# Patient Record
Sex: Male | Born: 1980 | Race: White | Hispanic: Yes | Smoking: Never smoker
Health system: Southern US, Community
[De-identification: ages and names within clinical notes are randomized; demographics above are authoritative.]

## PROBLEM LIST (undated history)

## (undated) ENCOUNTER — Emergency Department (HOSPITAL_COMMUNITY): Admission: EM | Payer: Self-pay | Source: Home / Self Care

## (undated) DIAGNOSIS — E785 Hyperlipidemia, unspecified: Secondary | ICD-10-CM

## (undated) DIAGNOSIS — I10 Essential (primary) hypertension: Secondary | ICD-10-CM

## (undated) DIAGNOSIS — N189 Chronic kidney disease, unspecified: Secondary | ICD-10-CM

## (undated) DIAGNOSIS — T7840XA Allergy, unspecified, initial encounter: Secondary | ICD-10-CM

## (undated) HISTORY — PX: NO PAST SURGERIES: SHX2092

## (undated) HISTORY — DX: Chronic kidney disease, unspecified: N18.9

## (undated) HISTORY — DX: Hyperlipidemia, unspecified: E78.5

## (undated) HISTORY — DX: Allergy, unspecified, initial encounter: T78.40XA

---

## 2008-04-08 ENCOUNTER — Emergency Department (HOSPITAL_COMMUNITY): Admission: EM | Admit: 2008-04-08 | Discharge: 2008-04-08 | Payer: Self-pay | Admitting: Emergency Medicine

## 2009-12-16 ENCOUNTER — Emergency Department (HOSPITAL_COMMUNITY): Admission: EM | Admit: 2009-12-16 | Discharge: 2009-12-16 | Payer: Self-pay | Admitting: Family Medicine

## 2015-12-14 ENCOUNTER — Encounter (HOSPITAL_COMMUNITY): Payer: Self-pay | Admitting: Emergency Medicine

## 2015-12-14 ENCOUNTER — Ambulatory Visit (HOSPITAL_COMMUNITY)
Admission: EM | Admit: 2015-12-14 | Discharge: 2015-12-14 | Disposition: A | Discharge status: Home / Self Care | Payer: Self-pay | Attending: Family Medicine | Admitting: Family Medicine

## 2015-12-14 DIAGNOSIS — N451 Epididymitis: Secondary | ICD-10-CM | POA: Insufficient documentation

## 2015-12-14 MED ORDER — CEFTRIAXONE SODIUM 250 MG IJ SOLR
INTRAMUSCULAR | Status: AC
  Filled 2015-12-14: qty 250

## 2015-12-14 MED ORDER — CEFTRIAXONE SODIUM 250 MG IJ SOLR
250.0000 mg | Freq: Once | INTRAMUSCULAR | Status: AC
  Administered 2015-12-14: 250 mg via INTRAMUSCULAR

## 2015-12-14 MED ORDER — LIDOCAINE HCL (PF) 1 % IJ SOLN
INTRAMUSCULAR | Status: AC
  Filled 2015-12-14: qty 5

## 2015-12-14 MED ORDER — DOXYCYCLINE HYCLATE 100 MG PO CAPS: 100.0000 mg | ORAL_CAPSULE | Freq: Two times a day (BID) | ORAL | Status: DC

## 2015-12-14 NOTE — ED Notes (Signed)
Testicular pain since February and has been seen at the clinic for this complaint.

## 2015-12-14 NOTE — Discharge Instructions (Signed)
Hipertensión  (Hypertension)  El término hipertensión es otra forma de denominar a la presión arterial elevada. La presión arterial elevada fuerza al corazón a trabajar más para bombear la sangre. Una lectura de la presión arterial consta de dos números: uno más alto sobre uno más bajo (por ejemplo, 110/72).  CUIDADOS EN EL HOGAR   · Haga que el médico le tome nuevamente la presión arterial.  · Tome los medicamentos solamente como se lo haya indicado el médico. Siga cuidadosamente las indicaciones. Los medicamentos pierden eficacia si omite dosis. El hecho de omitir las dosis también aumenta el riesgo de otros problemas.  · No fume.  · Contrólese la presión arterial en su casa como se lo haya indicado el médico.  SOLICITE AYUDA SI:  · Piensa que tiene una reacción a los medicamentos que está tomando.  · Tiene mareos o dolores de cabeza reiterados.  · Se le inflaman (hinchan) los tobillos.  · Tiene problemas de visión.  SOLICITE AYUDA DE INMEDIATO SI:   · Tiene un dolor de cabeza muy intenso y está confundido.  · Se siente débil, aturdido o se desmaya.  · Tiene dolor en el pecho o el estómago (abdominal).  · Tiene vómitos.  · No puede respirar muy bien.  ASEGÚRESE DE QUE:   · Comprende estas instrucciones.  · Controlará su afección.  · Recibirá ayuda de inmediato si no mejora o si empeora.     Esta información no tiene como fin reemplazar el consejo del médico. Asegúrese de hacerle al médico cualquier pregunta que tenga.     Document Released: 12/08/2009 Document Revised: 06/25/2013  Elsevier Interactive Patient Education ©2016 Elsevier Inc[employer]

## 2015-12-15 LAB — URINE CYTOLOGY ANCILLARY ONLY
CHLAMYDIA, DNA PROBE: NEGATIVE
Neisseria Gonorrhea: NEGATIVE

## 2015-12-15 NOTE — ED Provider Notes (Signed)
CSN: [id number]     Arrival date & time 12/14/15  1731 History   First MD Initiated Contact with Patient 12/14/15 1914     Chief Complaint  Patient presents with  . Testicle Pain   (Consider location/radiation/quality/duration/timing/severity/associated sxs/prior Treatment) HPI History obtained from patient/interpreter Location:   scrotum Context/Duration: Originally in February, was treated with cipro, symptoms got better, recently returned.   Severity:2   Quality:hard to describe, but not acute sharp pain Timing:      episodic      Home Treatment: none except no sex Associated symptoms:  none Family History:none    History reviewed. No pertinent past medical history. History reviewed. No pertinent past surgical history. No family history on file. Social History  Substance Use Topics  . Smoking status: Never Smoker   . Smokeless tobacco: None  . Alcohol Use: Yes    Review of Systems  Denies: HEADACHE, NAUSEA, ABDOMINAL PAIN, CHEST PAIN, CONGESTION, DYSURIA, SHORTNESS OF BREATH  Allergies  Review of patient's allergies indicates no known allergies.  Home Medications   Prior to Admission medications   Medication Sig Start Date End Date Taking? Authorizing Provider  doxycycline (VIBRAMYCIN) 100 MG capsule Take 1 capsule (100 mg total) by mouth 2 (two) times daily. 12/14/15   Tharon AquasFrank C Birney Belshe, PA   Meds Ordered and Administered this Visit   Medications  cefTRIAXone (ROCEPHIN) injection 250 mg (250 mg Intramuscular Given 12/14/15 1947)    BP 161/104 mmHg  Pulse 99  Temp(Src) 98 F (36.7 C) (Oral)  SpO2 100% No data found.   Physical Exam NURSES NOTES AND VITAL SIGNS REVIEWED. CONSTITUTIONAL: Well developed, well nourished, no acute distress HEENT: normocephalic, atraumatic EYES: Conjunctiva normal NECK:normal ROM, supple, no adenopathy PULMONARY:No respiratory distress, normal effort ABDOMINAL: Soft, ND, NT BS+, No CVAT MUSCULOSKELETAL: Normal ROM of all  extremities,  SKIN: warm and dry without rash PSYCHIATRIC: Mood and affect, behavior are normal GENITALIA: NORMAL EXTERNAL MALE PENIS AND SCROTUM.  NO VISIBLE OR PALPABLE LESIONS.  Left epididymis is tender to palpation.  Testicles are both in a vertical lie.  ED Course  Procedures (including critical care time)  Labs Review Labs Reviewed  URINE CYTOLOGY ANCILLARY ONLY   pending Imaging Review No results found.   Visual Acuity Review  Right Eye Distance:   Left Eye Distance:   Bilateral Distance:    Right Eye Near:   Left Eye Near:    Bilateral Near:      Rx for doxycycline and ceftriaxone There is no suggestion of torsion, no abnormal mass palpated. Pt does not perform scrotal self examination.  Suggest follow up with urology if not steadily getting better  MDM   1. Epididymitis, left     Patient is reassured that there are no issues that require transfer to higher level of care at this time or additional tests. Patient is advised to continue home symptomatic treatment. Patient is advised that if there are new or worsening symptoms to attend the emergency department, contact primary care provider, or return to UC. Instructions of care provided discharged home in stable condition.    THIS NOTE WAS GENERATED USING A VOICE RECOGNITION SOFTWARE PROGRAM. ALL REASONABLE EFFORTS  WERE MADE TO PROOFREAD THIS DOCUMENT FOR ACCURACY.  I have verbally reviewed the discharge instructions with the patient. A printed AVS was given to the patient.  All questions were answered prior to discharge.      Tharon AquasFrank C Lucah Petta, GeorgiaPA 12/15/15 [phone]

## 2015-12-21 ENCOUNTER — Emergency Department (HOSPITAL_COMMUNITY)
Admission: EM | Admit: 2015-12-21 | Discharge: 2015-12-21 | Disposition: A | Discharge status: Home / Self Care | Payer: Self-pay | Attending: Emergency Medicine | Admitting: Emergency Medicine

## 2015-12-21 ENCOUNTER — Emergency Department (HOSPITAL_COMMUNITY): Payer: Self-pay

## 2015-12-21 DIAGNOSIS — N433 Hydrocele, unspecified: Secondary | ICD-10-CM | POA: Insufficient documentation

## 2015-12-21 DIAGNOSIS — R51 Headache: Secondary | ICD-10-CM | POA: Insufficient documentation

## 2015-12-21 DIAGNOSIS — Z79899 Other long term (current) drug therapy: Secondary | ICD-10-CM | POA: Insufficient documentation

## 2015-12-21 DIAGNOSIS — N5089 Other specified disorders of the male genital organs: Secondary | ICD-10-CM

## 2015-12-21 LAB — URINALYSIS, ROUTINE W REFLEX MICROSCOPIC
Bilirubin Urine: NEGATIVE
GLUCOSE, UA: NEGATIVE mg/dL
HGB URINE DIPSTICK: NEGATIVE
Ketones, ur: NEGATIVE mg/dL
Leukocytes, UA: NEGATIVE
Nitrite: NEGATIVE
Protein, ur: NEGATIVE mg/dL
SPECIFIC GRAVITY, URINE: 1.025 (ref 1.005–1.030)
pH: 5.5 (ref 5.0–8.0)

## 2015-12-21 MED ORDER — IBUPROFEN 600 MG PO TABS: 600.0000 mg | ORAL_TABLET | Freq: Four times a day (QID) | ORAL | Status: DC | PRN

## 2015-12-21 NOTE — ED Notes (Signed)
Language line used for discharge instructions, pt denies concerns with discharge

## 2015-12-21 NOTE — Discharge Instructions (Signed)
Hidrocele en los adultos  (Hydrocele, Adult)  El hidrocele es la acumulación de líquido en la bolsa de piel flácida que aloja los testículos (escroto). Generalmente, afecta solo a un testículo.  CAUSAS  Esta afección puede ser causada por lo siguiente:  · Una lesión en el escroto.  · Una infección.  · Un tumor o cáncer en el testículo.  · Torsión de un testículo.  · Disminución del flujo sanguíneo hacia el escroto.  SÍNTOMAS  El hidrocele se siente como un globo lleno de agua. También puede sentirse pesado. El hidrocele puede causar lo siguiente:  · Hinchazón del escroto. Esta puede disminuir al acostarse.  · Hinchazón de la ingle.  · Molestia leve en el escroto.  · Dolor. Este puede aparecer si el hidrocele fue causado por una infección o una torsión.  DIAGNÓSTICO  Esta afección se puede diagnosticar mediante la historia clínica, un examen físico y estudios de diagnóstico por imágenes. También se pueden hacer análisis de sangre y de orina para determinar si hay infecciones.  TRATAMIENTO  El tratamiento puede incluir lo siguiente:  · Conducta expectante, en especial si el hidrocele no causa síntomas.  · Tratamiento de la afección preexistente. Esto puede incluir la administración de antibióticos.  · Cirugía para drenar el líquido. Algunas opciones quirúrgicas incluyen lo siguiente:    Aspiración con aguja. Para este procedimiento, se usa una aguja para drenar el líquido.    Hidrocelectomía. Para este procedimiento, se realiza una incisión en el escroto para extirpar el saco de líquido.  INSTRUCCIONES PARA EL CUIDADO EN EL HOGAR  · Concurra a todas las visitas de control como se lo haya indicado el médico. Esto es importante.  · Vigile el hidrocele para detectar cualquier cambio.  · Tome los medicamentos de venta libre y los recetados solamente como se lo haya indicado el médico.  · Si le recetaron un antibiótico, tómelo como se lo haya indicado el médico. No deje de usar el antibiótico aunque la afección  mejore.  SOLICITE ATENCIÓN MÉDICA SI:  · La hinchazón del escroto o de la ingle empeora.  · El hidrocele se enrojece, está duro o sensible al tacto, o le causa dolor.  · Observa cambios en el hidrocele.  · Tiene fiebre.     Esta información no tiene como fin reemplazar el consejo del médico. Asegúrese de hacerle al médico cualquier pregunta que tenga.     Document Released: 04/10/2013 Document Revised: 11/04/2014  Elsevier Interactive Patient Education ©2016 Elsevier Inc.

## 2015-12-21 NOTE — ED Notes (Addendum)
Patient complains of one testical is enlarged and is in pain, patient to clinic last Wednesday for this and they told him to come to ED if pain doesn't get better. Patient was diagnosed with Epididymitis, started on antibiotics.

## 2015-12-21 NOTE — ED Notes (Signed)
Patient states he has been having headaches since December and blurred vision, currently taking Doxyxycline.

## 2015-12-21 NOTE — ED Provider Notes (Signed)
CSN: [id number]     Arrival date & time 12/21/15  1608 History   First MD Initiated Contact with Patient 12/21/15 1814     Chief Complaint  Patient presents with  . Groin Pain  . Headache     (Consider location/radiation/quality/duration/timing/severity/associated sxs/prior Treatment) HPI Comments: Patient presents to the emergency department with chief complaint of left testicular pain and swelling. Patient states that the symptoms started on January 30. States that they have been intermittent until now. He has been treated in the past for epididymitis. He denies any improvement for this. Denies any dysuria or penile discharge. Denies any abdominal pain, nausea, or vomiting. He states that since starting doxycycline at urgent care a few days ago he has had some headache, generalized pruritus, and some floaters in his vision which is intermittent.  The history is provided by the patient. No language interpreter was used.    No past medical history on file. No past surgical history on file. No family history on file. Social History  Substance Use Topics  . Smoking status: Never Smoker   . Smokeless tobacco: Not on file  . Alcohol Use: Yes    Review of Systems  Constitutional: Negative for fever and chills.  Respiratory: Negative for shortness of breath.   Cardiovascular: Negative for chest pain.  Gastrointestinal: Negative for nausea, vomiting, diarrhea and constipation.  Genitourinary: Positive for scrotal swelling and testicular pain. Negative for dysuria.  All other systems reviewed and are negative.     Allergies  Review of patient's allergies indicates no known allergies.  Home Medications   Prior to Admission medications   Medication Sig Start Date End Date Taking? Authorizing Provider  doxycycline (VIBRAMYCIN) 100 MG capsule Take 1 capsule (100 mg total) by mouth 2 (two) times daily. 12/14/15   Tharon Aquas, PA   BP 155/92 mmHg  Pulse 82  Temp(Src) 98.2 F  (36.8 C) (Oral)  Resp 18  SpO2 98% Physical Exam  Constitutional: He is oriented to person, place, and time. He appears well-developed and well-nourished.  HENT:  Head: Normocephalic and atraumatic.  Eyes: Conjunctivae and EOM are normal. Pupils are equal, round, and reactive to light. Right eye exhibits no discharge. Left eye exhibits no discharge. No scleral icterus.  Neck: Normal range of motion. Neck supple. No JVD present.  Cardiovascular: Normal rate, regular rhythm and normal heart sounds.  Exam reveals no gallop and no friction rub.   No murmur heard. Pulmonary/Chest: Effort normal and breath sounds normal. No respiratory distress. He has no wheezes. He has no rales. He exhibits no tenderness.  Abdominal: Soft. He exhibits no distension and no mass. There is no tenderness. There is no rebound and no guarding.  No focal abdominal tenderness, no RLQ tenderness or pain at McBurney's point, no RUQ tenderness or Murphy's sign, no left-sided abdominal tenderness, no fluid wave, or signs of peritonitis   Genitourinary:  Left testicle is moderately enlarged and tender to palpation posteriorly Uncircumcised No other masses, lesions, or discharge noted  Musculoskeletal: Normal range of motion. He exhibits no edema or tenderness.  Neurological: He is alert and oriented to person, place, and time.  Skin: Skin is warm and dry.  Psychiatric: He has a normal mood and affect. His behavior is normal. Judgment and thought content normal.  Nursing note and vitals reviewed.   ED Course  Procedures (including critical care time) Results for orders placed or performed during the hospital encounter of 12/21/15  Urinalysis, Routine w reflex microscopic (  not at [facility])  Result Value Ref Range   Color, Urine YELLOW YELLOW   APPearance CLEAR [place] [place]   Specific Gravity, Urine 1.025 1.005 - 1.030   pH 5.5 5.0 - 8.0   Glucose, UA NEGATIVE NEGATIVE mg/dL   Hgb urine dipstick NEGATIVE NEGATIVE   Bilirubin  Urine NEGATIVE NEGATIVE   Ketones, ur NEGATIVE NEGATIVE mg/dL   Protein, ur NEGATIVE NEGATIVE mg/dL   Nitrite NEGATIVE NEGATIVE   Leukocytes, UA NEGATIVE NEGATIVE   [place] Scrotum  12/21/2015  CLINICAL DATA:  Left testicular swelling and pain EXAM: ULTRASOUND OF SCROTUM TECHNIQUE: Complete ultrasound examination of the testicles, epididymis, and other scrotal structures was performed. COMPARISON:  None. FINDINGS: Right testicle Measurements: 5 x 3 x 3.6 cm. Normal color Doppler signal with arterial and venous waveforms recorded. No mass or microlithiasis visualized. Left testicle Measurements: 4.7 x 2.9 x 3.7 cm. Normal color Doppler signal. Arterial and venous waveforms are recorded. No mass or microlithiasis visualized. Right epididymis:  Normal in size and appearance. Left epididymis:  Normal in size and appearance. Hydrocele:  Small, bilateral. Varicocele:  None visualized. IMPRESSION: 1. Normal testes. 2. Small bilateral hydroceles. Electronically Signed   By: Corlis Leak  Hassell M.D.   On: 12/21/2015 20:19   [place] Art/ven Flow Abd Pelv Doppler  12/21/2015  CLINICAL DATA:  Left testicular swelling and pain EXAM: ULTRASOUND OF SCROTUM TECHNIQUE: Complete ultrasound examination of the testicles, epididymis, and other scrotal structures was performed. COMPARISON:  None. FINDINGS: Right testicle Measurements: 5 x 3 x 3.6 cm. Normal color Doppler signal with arterial and venous waveforms recorded. No mass or microlithiasis visualized. Left testicle Measurements: 4.7 x 2.9 x 3.7 cm. Normal color Doppler signal. Arterial and venous waveforms are recorded. No mass or microlithiasis visualized. Right epididymis:  Normal in size and appearance. Left epididymis:  Normal in size and appearance. Hydrocele:  Small, bilateral. Varicocele:  None visualized. IMPRESSION: 1. Normal testes. 2. Small bilateral hydroceles. Electronically Signed   By: Corlis Leak  Hassell M.D.   On: 12/21/2015 20:19      MDM   Final diagnoses:  Hydrocele in  adult    Patient with left testicular swelling and pain since January. His symptoms have been intermittent. He is been treated intermittently for epididymitis once with good relief, and most recently without any relief.  Ultrasound is consistent with small bilateral hydroceles, otherwise normal testes, normal Doppler color-flow for arterial and venous. No evidence of torsion. Patient is well-appearing. Symptoms seem to be chronic. No penile discharge. Will discontinue doxycycline given that there is no evidence of epididymitis on ultrasound. Recommend urology follow-up.  Patient advised to follow-up with his primary care doctor regarding his headaches. He is neurovascularly intact. Vital signs are stable. Do not feel that additional emergent workup is indicated today.  Patient seen by and discussed with Dr. Denton LankSteinl, who agrees with the plan.    Roxy HorsemanRobert Lilliemae Fruge, PA-C 12/21/15 2119  Cathren LaineKevin Steinl, MD 12/21/15 [phone]

## 2015-12-22 LAB — GC/CHLAMYDIA PROBE AMP (~~LOC~~) NOT AT ARMC
CHLAMYDIA, DNA PROBE: NEGATIVE
NEISSERIA GONORRHEA: NEGATIVE

## 2015-12-25 ENCOUNTER — Ambulatory Visit: Payer: Self-pay | Attending: Internal Medicine

## 2016-01-25 ENCOUNTER — Encounter (HOSPITAL_COMMUNITY): Payer: Self-pay | Admitting: *Deleted

## 2016-01-25 ENCOUNTER — Emergency Department (HOSPITAL_COMMUNITY)
Admission: EM | Admit: 2016-01-25 | Discharge: 2016-01-26 | Disposition: A | Discharge status: Home / Self Care | Payer: Self-pay | Attending: Emergency Medicine | Admitting: Emergency Medicine

## 2016-01-25 DIAGNOSIS — I10 Essential (primary) hypertension: Secondary | ICD-10-CM | POA: Insufficient documentation

## 2016-01-25 DIAGNOSIS — K5909 Other constipation: Secondary | ICD-10-CM | POA: Insufficient documentation

## 2016-01-25 DIAGNOSIS — N5082 Scrotal pain: Secondary | ICD-10-CM | POA: Insufficient documentation

## 2016-01-25 DIAGNOSIS — M7918 Myalgia, other site: Secondary | ICD-10-CM

## 2016-01-25 DIAGNOSIS — K648 Other hemorrhoids: Secondary | ICD-10-CM | POA: Insufficient documentation

## 2016-01-25 DIAGNOSIS — K5904 Chronic idiopathic constipation: Secondary | ICD-10-CM

## 2016-01-25 HISTORY — DX: Essential (primary) hypertension: I10

## 2016-01-25 NOTE — ED Triage Notes (Addendum)
Pt c/o left hip pain and burning sensation going down his left leg when having a bowel movement, last BM today, reports a little blood when he strains to have BM. Pt denies dysuria. Pt states about a month ago he was dx with fluid in his testicles but [facility] and told [facility] had [facility] her[facility]. Pt does a lot of heavy lifting at work. Pt denies n/v

## 2016-01-26 LAB — POC OCCULT BLOOD, ED: FECAL OCCULT BLD: NEGATIVE

## 2016-01-26 MED ORDER — HYDROCORTISONE ACETATE 25 MG RE SUPP: 25.0000 mg | Freq: Two times a day (BID) | RECTAL | 0 refills | Status: DC

## 2016-01-26 MED ORDER — NAPROXEN 500 MG PO TABS: 500.0000 mg | ORAL_TABLET | Freq: Two times a day (BID) | ORAL | 0 refills | Status: DC

## 2016-01-26 NOTE — ED Provider Notes (Signed)
[facility]-EMERGENCY DEPT Provider Note   CSN: [id number] Arrival date & time: 01/25/16  1738  First Provider Contact:  First MD Initiated Contact with Patient 01/26/16 0314     By signing my name below, I, Bethel Born, attest that this documentation has been prepared under the direction and in the presence of Dione Booze, MD. Electronically Signed: Bethel Born, ED Scribe. 01/26/16. 3:43 AM  History   Chief Complaint Chief Complaint  Patient presents with  . Hip Pain  . Testicle Pain    HPI  The history is provided by the patient. A language interpreter was used.   [name] [name] is a 35 y.o. male who presents to the Emergency Department complaining of intermittent, burning, left buttock pain with onset 1 month ago. The pain is occasionally worse with walking but not bending or lifting. He took nothing for pain at home. Associated symptoms include intermittent back pain and left-sided testicular pain. Pt notes that he was treated for an infection at the testicle and then for fluid at the left testicle but the pain has persisted.  He also complains of pain and straining with hard bowel movements after whiich he noes blood. The bowel symptoms started 1 month ago.   Past Medical History:  Diagnosis Date  . Hypertension     There are no active problems to display for this patient.   History reviewed. No pertinent surgical history.     Home Medications    Prior to Admission medications   Medication Sig Start Date End Date Taking? Authorizing Provider  ibuprofen (ADVIL,MOTRIN) 600 MG tablet Take 1 tablet (600 mg total) by mouth every 6 (six) hours as needed. Patient taking differently: Take 600 mg by mouth every 6 (six) hours as needed for moderate pain.  12/21/15  Yes Roxy Horseman, PA-C    Family History History reviewed. No pertinent family history.  Social History Social History  Substance Use Topics  . Smoking status: Never Smoker  . Smokeless tobacco: Never  Used  . Alcohol use Yes     Allergies   Review of patient's allergies indicates no known allergies.   Review of Systems Review of Systems  Gastrointestinal: Positive for blood in stool and constipation.  Genitourinary: Positive for testicular pain.  Musculoskeletal: Positive for back pain.       Pain at left buttock   All other systems reviewed and are negative.    Physical Exam Updated Vital Signs BP 130/74   Pulse 66   Temp 97.4 F (36.3 C) (Oral)   Resp 20   SpO2 98%   Physical Exam  Constitutional: He is oriented to person, place, and time. He appears well-developed and well-nourished.  HENT:  Head: Normocephalic and atraumatic.  Eyes: EOM are normal. Pupils are equal, round, and reactive to light.  Neck: Normal range of motion. Neck supple. No JVD present.  Cardiovascular: Normal rate, regular rhythm, normal heart sounds and intact distal pulses.   No murmur heard. Pulmonary/Chest: Effort normal and breath sounds normal. He has no wheezes. He has no rales. He exhibits no tenderness.  Abdominal: Soft. He exhibits no distension and no mass. There is no tenderness.  Genitourinary:  Genitourinary Comments: Uncircumcised penis Testes descended without massed or tenderness Mild tenderness at left spermatic chord Mild to moderate tenderness at left inguinal ring  No external hemorrhoids or fissures Normal sphincter tone Internal hemorrhoid palpable No stool present No gross blood   Musculoskeletal: Normal range of motion. He exhibits tenderness. He exhibits no  edema.  No tenderness in the back Mild tenderness at left gluteal area Negative SLR   Lymphadenopathy:    He has no cervical adenopathy.  Neurological: He is alert and oriented to person, place, and time. He has normal reflexes. No cranial nerve deficit. He exhibits normal muscle tone. Coordination normal.  Skin: Skin is warm and dry. No rash noted.  Psychiatric: He has a normal mood and affect. His  behavior is normal. Judgment and thought content normal.  Nursing note and vitals reviewed.    ED Treatments / Results  Labs (all labs ordered are listed, but only abnormal results are displayed) Results for orders placed or performed during the hospital encounter of 01/25/16  POC occult blood, ED Provider will collect  Result Value Ref Range   Fecal Occult Bld NEGATIVE NEGATIVE   Procedures Procedures (including critical care time)  Medications Ordered in ED Medications - No data to display   Initial Impression / Assessment and Plan / ED Course  I have reviewed the triage vital signs and the nursing notes.  COORDINATION OF CARE: 3:40 AM Discussed treatment plan which includes lab work with pt at bedside and pt agreed to plan.  Pertinent lab results that were available during my care of the patient were reviewed by me and considered in my medical decision making (see chart for details).  Clinical Course    Left buttock pain clearly musculoskeletal. No definite signs radicular pain. Scrotal pain appears to be related to known hydrocele. Old records are reviewed showing urgent care visit on June 12 with diagnoses diverticulitis, ED visit June 19 with diagnosis of bilateral hydroceles. Rectal bleeding seems to be secondary to internal hemorrhoids. No evidence of occult blood on rectal exam. He is referred to urology for follow-up of his scrotal pain. He is given prescription for Anusol HC suppository for his hemorrhoids and naproxen for his buttock pain.   Final Clinical Impressions(s) / ED Diagnoses   Final diagnoses:  Pain in left buttock  Bleeding internal hemorrhoids  Functional constipation  Scrotal pain    New Prescriptions New Prescriptions   No medications on file   I personally performed the services described in this documentation, which was scribed in my presence. The recorded information has been reviewed and is accurate.      Dione Booze, MD 01/26/16  2115

## 2016-02-06 ENCOUNTER — Ambulatory Visit (HOSPITAL_COMMUNITY)
Admission: EM | Admit: 2016-02-06 | Discharge: 2016-02-06 | Disposition: A | Discharge status: Home / Self Care | Payer: Self-pay | Attending: Emergency Medicine | Admitting: Emergency Medicine

## 2016-02-06 ENCOUNTER — Encounter (HOSPITAL_COMMUNITY): Payer: Self-pay | Admitting: Emergency Medicine

## 2016-02-06 DIAGNOSIS — M7918 Myalgia, other site: Secondary | ICD-10-CM

## 2016-02-06 DIAGNOSIS — M791 Myalgia: Secondary | ICD-10-CM | POA: Insufficient documentation

## 2016-02-06 DIAGNOSIS — I10 Essential (primary) hypertension: Secondary | ICD-10-CM | POA: Insufficient documentation

## 2016-02-06 LAB — C-REACTIVE PROTEIN: CRP: 0.8 mg/dL (ref <1.0)

## 2016-02-06 LAB — SEDIMENTATION RATE: Sed Rate: 8 mm/hr (ref 0–16)

## 2016-02-06 MED ORDER — DICLOFENAC POTASSIUM 50 MG PO TABS: 50.0000 mg | ORAL_TABLET | Freq: Three times a day (TID) | ORAL | 0 refills | Status: DC

## 2016-02-06 NOTE — ED Notes (Signed)
Video interpreters # [phone] and [id number] were used in triage , provider assessment and discharge.

## 2016-02-06 NOTE — ED Triage Notes (Signed)
The patient presented to the [facility] with a complaint of bilateral arm pain that has been ongoing for 3 weeks. The patient also reported lower extremity edema off and on that increases with alcohol consumption.

## 2016-02-06 NOTE — ED Provider Notes (Signed)
CSN: [id number]     Arrival date & time 02/06/16  1158 History   First MD Initiated Contact with Patient 02/06/16 1216     Chief Complaint  Patient presents with  . Arm Pain   (Consider location/radiation/quality/duration/timing/severity/associated sxs/prior Treatment) HPI history is obtained through video interpreter Patient is a 35 year old male who has had chronic musculoskeletal pain for quite some time now. He has been seen in the emergency department. His description of the pain is quite vague he states that it is all over his body essentially and comes and goes lasts for 5-6 seconds and then moves to another spot. He also complains of pain in his left testicle and states that he is currently under treatment for this. He has had no other workup. He has been evaluated for hypertension and hypercholesterol.  Past Medical History:  Diagnosis Date  . Hypertension    History reviewed. No pertinent surgical history. History reviewed. No pertinent family history. Social History  Substance Use Topics  . Smoking status: Never Smoker  . Smokeless tobacco: Never Used  . Alcohol use Yes    Review of Systems  Denies: HEADACHE, NAUSEA, ABDOMINAL PAIN, CHEST PAIN, CONGESTION, DYSURIA, SHORTNESS OF BREATH  Allergies  Review of patient's allergies indicates no known allergies.  Home Medications   Prior to Admission medications   Medication Sig Start Date End Date Taking? Authorizing Provider  atorvastatin (LIPITOR) 10 MG tablet Take 10 mg by mouth daily.   Yes Historical Provider, MD  lisinopril-hydrochlorothiazide (PRINZIDE,ZESTORETIC) 10-12.5 MG tablet Take 1 tablet by mouth daily.   Yes Historical Provider, MD  diclofenac (CATAFLAM) 50 MG tablet Take 1 tablet (50 mg total) by mouth 3 (three) times daily. 02/06/16   Tharon Aquas, PA  hydrocortisone (ANUSOL-HC) 25 MG suppository Place 1 suppository (25 mg total) rectally 2 (two) times daily. 01/26/16   Dione Booze, MD  naproxen (NAPROSYN)  500 MG tablet Take 1 tablet (500 mg total) by mouth 2 (two) times daily. 01/26/16   Dione Booze, MD   Meds Ordered and Administered this Visit  Medications - No data to display  BP 134/77 (BP Location: Left Arm)   Pulse 83   Temp 98.6 F (37 C) (Oral)   Resp 16   SpO2 100%  No data found.   Physical Exam NURSES NOTES AND VITAL SIGNS REVIEWED. CONSTITUTIONAL: Well developed, well nourished, no acute distress HEENT: normocephalic, atraumatic EYES: Conjunctiva normal NECK:normal ROM, supple, no adenopathy PULMONARY:No respiratory distress, normal effort ABDOMINAL: Soft, ND, NT BS+, No CVAT MUSCULOSKELETAL: Normal ROM of all extremities,  SKIN: warm and dry without rash PSYCHIATRIC: Mood and affect, behavior are normal  Urgent Care Course   Clinical Course    Procedures (including critical care time)  Labs Review Labs Reviewed  SEDIMENTATION RATE  C-REACTIVE PROTEIN  Reviewed  Imaging Review No results found.   Visual Acuity Review  Right Eye Distance:   Left Eye Distance:   Bilateral Distance:    Right Eye Near:   Left Eye Near:    Bilateral Near:        Diclofenac urology follow-up for hydrocele MDM   1. Musculoskeletal pain     Patient is reassured that there are no issues that require transfer to higher level of care at this time or additional tests. Patient is advised to continue home symptomatic treatment. Patient is advised that if there are new or worsening symptoms to attend the emergency department, contact primary care provider, or return to UC. Instructions  of care provided discharged home in stable condition.    THIS NOTE WAS GENERATED USING A VOICE RECOGNITION SOFTWARE PROGRAM. ALL REASONABLE EFFORTS  WERE MADE TO PROOFREAD THIS DOCUMENT FOR ACCURACY.  I have verbally reviewed the discharge instructions with the patient. A printed AVS was given to the patient.  All questions were answered prior to discharge.      Tharon Aquas,  PA 02/06/16 1430

## 2016-02-09 ENCOUNTER — Encounter (HOSPITAL_COMMUNITY): Payer: Self-pay

## 2016-02-09 ENCOUNTER — Emergency Department (HOSPITAL_COMMUNITY)
Admission: EM | Admit: 2016-02-09 | Discharge: 2016-02-10 | Disposition: A | Discharge status: Home / Self Care | Payer: Self-pay | Attending: Emergency Medicine | Admitting: Emergency Medicine

## 2016-02-09 DIAGNOSIS — M791 Myalgia: Secondary | ICD-10-CM | POA: Insufficient documentation

## 2016-02-09 DIAGNOSIS — I10 Essential (primary) hypertension: Secondary | ICD-10-CM | POA: Insufficient documentation

## 2016-02-09 DIAGNOSIS — H538 Other visual disturbances: Secondary | ICD-10-CM | POA: Insufficient documentation

## 2016-02-09 DIAGNOSIS — Z79899 Other long term (current) drug therapy: Secondary | ICD-10-CM | POA: Insufficient documentation

## 2016-02-09 DIAGNOSIS — M7918 Myalgia, other site: Secondary | ICD-10-CM

## 2016-02-09 DIAGNOSIS — M2550 Pain in unspecified joint: Secondary | ICD-10-CM | POA: Insufficient documentation

## 2016-02-09 NOTE — ED Provider Notes (Signed)
[facility] DEPT Provider Note   CSN: [id number] Arrival date & time: 02/09/16  2876  First Provider Contact:  None       History   Chief Complaint Chief Complaint  Patient presents with  . Sciatica  . Blurred Vision  . Leg Swelling    HPI [name] [name] is a 35 y.o. male.  HPI  This patient has multiple complaints. He complains of pain in his left buttock, toes, knees, and arms for about a month. He was seen here twice for it and states he isn't feeling better. He has not yet established a PCP. The pain is worse with movement, better with rest. He also complains of some blurry vision in his right eye for the same time period.    Past Medical History:  Diagnosis Date  . Hypertension     There are no active problems to display for this patient.   History reviewed. No pertinent surgical history.     Home Medications    Prior to Admission medications   Medication Sig Start Date End Date Taking? Authorizing Provider  atorvastatin (LIPITOR) 10 MG tablet Take 10 mg by mouth daily.    Historical Provider, MD  diclofenac (CATAFLAM) 50 MG tablet Take 1 tablet (50 mg total) by mouth 3 (three) times daily. 02/06/16   Konrad Felix, PA  hydrocortisone (ANUSOL-HC) 25 MG suppository Place 1 suppository (25 mg total) rectally 2 (two) times daily. 02/12/56   Delora Fuel, MD  lisinopril-hydrochlorothiazide (PRINZIDE,ZESTORETIC) 10-12.5 MG tablet Take 1 tablet by mouth daily.    Historical Provider, MD  naproxen (NAPROSYN) 500 MG tablet Take 1 tablet (500 mg total) by mouth 2 (two) times daily. 2/62/03   Delora Fuel, MD    Family History History reviewed. No pertinent family history.  Social History Social History  Substance Use Topics  . Smoking status: Never Smoker  . Smokeless tobacco: Never Used  . Alcohol use Yes     Allergies   Doxycycline   Review of Systems Review of Systems  Constitutional: Negative for chills and fever.  HENT: Negative for ear pain and  sore throat.   Eyes: Positive for visual disturbance. Negative for photophobia, pain, discharge, redness and itching.  Respiratory: Negative for cough and shortness of breath.   Cardiovascular: Negative for chest pain and palpitations.  Gastrointestinal: Negative for abdominal pain and vomiting.  Genitourinary: Negative for dysuria and hematuria.  Musculoskeletal: Positive for arthralgias, back pain and myalgias.  Skin: Negative for color change and rash.  Neurological: Negative for seizures and syncope.  All other systems reviewed and are negative.    Physical Exam Updated Vital Signs BP 128/74 (BP Location: Right Arm)   Pulse 80   Temp 98 F (36.7 C) (Oral)   Resp 18   Ht _0  (1.727 m)   Wt 90.7 kg   SpO2 96%   BMI 30.41 kg/m   Physical Exam  Constitutional: He appears well-developed and well-nourished.  HENT:  Head: Normocephalic and atraumatic.  Eyes: Conjunctivae, EOM and lids are normal. Pupils are equal, round, and reactive to light. Right conjunctiva is not injected. Right conjunctiva has no hemorrhage. Left conjunctiva is not injected. Left conjunctiva has no hemorrhage. Right eye exhibits normal extraocular motion. Left eye exhibits normal extraocular motion. Right pupil is round and reactive. Left pupil is round and reactive. Pupils are equal.  Fundoscopic exam:      The right eye shows no AV nicking, no exudate, no hemorrhage and no papilledema.  The left eye shows no AV nicking, no exudate, no hemorrhage and no papilledema.  Neck: Neck supple.  Cardiovascular: Normal rate and regular rhythm.   No murmur heard. Pulmonary/Chest: Effort normal and breath sounds normal. No respiratory distress.  Abdominal: Soft. There is no tenderness.  Musculoskeletal: He exhibits no edema.  TTP over left buttock  Neurological: He is alert.  Skin: Skin is warm and dry.  Psychiatric: He has a normal mood and affect.  Nursing note and vitals reviewed.    ED Treatments /  Results  Labs (all labs ordered are listed, but only abnormal results are displayed) Labs Reviewed - No data to display  EKG  EKG Interpretation None       Radiology No results found.  Procedures Procedures (including critical care time)  Medications Ordered in ED Medications - No data to display   Initial Impression / Assessment and Plan / ED Course  I have reviewed the triage vital signs and the nursing notes.  Pertinent labs & imaging results that were available during my care of the patient were reviewed by me and considered in my medical decision making (see chart for details).  Clinical Course    Patient has received evaluation here for these complaints. He appears stable here. Regarding blurry vision in right eye, his neurologic exam is normal. The eye appears normal, PERRL, EOMI, and on fundoscopic exam there is no sign of vitreous hemhorrage or hyphema, and normal appearing vessels. Bedside ocular ultrasound done and did not show retinal attachment.   Regarding muscle pain, it does appear to be musculoskeletal. No signs of spinal cord compression syndromes. Back XR done of SI joints as screening exam for ankylosing spondylitis and was negative. He recently had a negative GC/CT and normal ESR/CRP.   Will encourage him to establish care with a PCP.   Final Clinical Impressions(s) / ED Diagnoses   Final diagnoses:  None    New Prescriptions New Prescriptions   No medications on file     Levada Schilling, MD 02/10/16 4163    Sherwood Gambler, MD 02/11/16 [phone]

## 2016-02-09 NOTE — ED Notes (Signed)
Patient is Spanish speaking only and will need an interpreter

## 2016-02-09 NOTE — ED Triage Notes (Signed)
Information via [employer] video interpreting.  Pt reports onset 1 month left sciatica pain that does not radiate.  Pt reports lifted item at work at onset.  Onset 1 month blurred vision, joint pain at toes, hands, arms and bilateral LE swelling.  Reports has not drank alcohol in 1 month.  Ambulating without difficulty.

## 2016-02-10 ENCOUNTER — Emergency Department (HOSPITAL_COMMUNITY): Payer: Self-pay

## 2016-02-10 NOTE — ED Notes (Signed)
See downtime notes for previous care given.

## 2016-02-27 ENCOUNTER — Ambulatory Visit (HOSPITAL_COMMUNITY)
Admission: EM | Admit: 2016-02-27 | Discharge: 2016-02-27 | Disposition: A | Discharge status: Home / Self Care | Payer: Self-pay | Attending: Emergency Medicine | Admitting: Emergency Medicine

## 2016-02-27 ENCOUNTER — Encounter (HOSPITAL_COMMUNITY): Payer: Self-pay | Admitting: Emergency Medicine

## 2016-02-27 DIAGNOSIS — K625 Hemorrhage of anus and rectum: Secondary | ICD-10-CM

## 2016-02-27 DIAGNOSIS — K5901 Slow transit constipation: Secondary | ICD-10-CM

## 2016-02-27 DIAGNOSIS — K648 Other hemorrhoids: Secondary | ICD-10-CM

## 2016-02-27 MED ORDER — POLYETHYLENE GLYCOL 3350 17 GM/SCOOP PO POWD: 17.0000 g | Freq: Every day | ORAL | 0 refills | Status: DC

## 2016-02-27 MED ORDER — HYDROCORTISONE ACE-PRAMOXINE 2.5-1 % RE CREA: 1.0000 "application " | TOPICAL_CREAM | Freq: Three times a day (TID) | RECTAL | 0 refills | Status: DC

## 2016-02-27 NOTE — Discharge Instructions (Signed)
Recommend using MiraLAX. Place 1 Full and 6 ounces of liquid and drink 1 of these mixes every hour 3 for constipation may leave. He made need to use one or 2 glasses a day until your colon is cleansed. Also start taking Colace 100 mg 3 times a day as a stool softener. Increase the fiber in your diet and limit fast food's, breads cheese.

## 2016-02-27 NOTE — ED Provider Notes (Signed)
CSN: [id number]     Arrival date & time 02/27/16  1202 History   First MD Initiated Contact with Patient 02/27/16 1259     Chief Complaint  Patient presents with  . Rectal Bleeding   (Consider location/radiation/quality/duration/timing/severity/associated sxs/prior Treatment) By mouth interpreter used. 35 year old Hispanic male presents to the urgent care for complaints of hemorrhoid. He has been seen in the emergency department in the urgent care for the [facility]. He has used the Anusol [facility] suppositories with minimal benefit. Even though he was advised that he was constipated and should not strain and to take laxatives he has not done these things. He has only use the suppositories. He states there is bleeding when straining, And then the bleeding stops. Denies abdominal pain, vomiting, fever.  It is noted that he has been to the emergency department several times as well has urgent care for multiple and varied complaints. He has been referred to urology in regarding to a hydrocele and is following up with that. He states that he was instructed to call for an appointment to obtain a PCP however he number he called stated that it would be a couple of months before he can get an appointment with anyone so he has not made an appointment.      Past Medical History:  Diagnosis Date  . Hypertension    History reviewed. No pertinent surgical history. History reviewed. No pertinent family history. Social History  Substance Use Topics  . Smoking status: Never Smoker  . Smokeless tobacco: Never Used  . Alcohol use Yes    Review of Systems  Constitutional: Negative for activity change, fatigue and fever.  HENT: Negative.   Eyes: Negative.   Respiratory: Negative.   Gastrointestinal: Positive for blood in stool, constipation and rectal pain. Negative for abdominal pain, diarrhea, nausea and vomiting.  Genitourinary: Negative.   Musculoskeletal: Negative.   Skin: Negative.   All other systems  reviewed and are negative.   Allergies  Doxycycline  Home Medications   Prior to Admission medications   Medication Sig Start Date End Date Taking? Authorizing Provider  atorvastatin (LIPITOR) 10 MG tablet Take 10 mg by mouth daily.   Yes Historical Provider, MD  lisinopril-hydrochlorothiazide (PRINZIDE,ZESTORETIC) 10-12.5 MG tablet Take 1 tablet by mouth daily.   Yes Historical Provider, MD  diclofenac (CATAFLAM) 50 MG tablet Take 1 tablet (50 mg total) by mouth 3 (three) times daily. 02/06/16   Tharon Aquas, PA  hydrocortisone (ANUSOL-HC) 25 MG suppository Place 1 suppository (25 mg total) rectally 2 (two) times daily. 01/26/16   Dione Booze, MD  hydrocortisone-pramoxine [facility]) 2.5-1 % rectal cream Place 1 application rectally 3 (three) times daily. 02/27/16   Hayden Rasmussen, NP  naproxen (NAPROSYN) 500 MG tablet Take 1 tablet (500 mg total) by mouth 2 (two) times daily. 01/26/16   Dione Booze, MD  polyethylene glycol powder (GLYCOLAX/MIRALAX) powder Take 17 g by mouth daily. 02/27/16   Hayden Rasmussen, NP   Meds Ordered and Administered this Visit  Medications - No data to display  BP 117/67 (BP Location: Left Arm)   Pulse 60   Temp 98.8 F (37.1 C) (Oral)   Resp 12   SpO2 98%  No data found.   Physical Exam  Constitutional: He is oriented to person, place, and time. He appears well-developed and well-nourished. No distress.  HENT:  Head: Normocephalic and atraumatic.  Eyes: EOM are normal.  Neck: Normal range of motion. Neck supple.  Cardiovascular: Normal rate.  Pulmonary/Chest: Effort normal. No respiratory distress.  Genitourinary:  Genitourinary Comments: Rectal exam: No evidence of external hemorrhoid or other lesions. No evidence of fissure or current gross bleeding. No erythema or hemorrhoidal tags. DRE reveals tenderness at 4:00 to 8:00 positions. There are soft cordlike structures in these positions produces tenderness to palpation. No other lesions palpated. No overt  bleeding. Prostate firm but mildly enlarged.  Musculoskeletal: He exhibits no edema.  Neurological: He is alert and oriented to person, place, and time. He exhibits normal muscle tone.  Skin: Skin is warm and dry.  Psychiatric: He has a normal mood and affect.  Nursing note and vitals reviewed.   Urgent Care Course   Clinical Course    Procedures (including critical care time)  Labs Review Labs Reviewed - No data to display  Imaging Review No results found.   Visual Acuity Review  Right Eye Distance:   Left Eye Distance:   Bilateral Distance:    Right Eye Near:   Left Eye Near:    Bilateral Near:         MDM   1. Slow transit constipation   2. Internal hemorrhoid   3. Rectal bleeding    Recommend using MiraLAX. Place 1 Full and 6 ounces of liquid and drink 1 of these mixes every hour 3 for constipation may leave. He made need to use one or 2 glasses a day until your colon is cleansed. Also start taking Colace 100 mg 3 times a day as a stool softener. Increase the fiber in your diet and limit fast food's, breads cheese. Information on high-power provider, constipation and medications are given to patient. Meds ordered this encounter  Medications  . hydrocortisone-pramoxine (ANALPRAM HC) 2.5-1 % rectal cream    Sig: Place 1 application rectally 3 (three) times daily.    Dispense:  30 g    Refill:  0    Order Specific Question:   Supervising Provider    Answer:   Eustace MooreMURRAY, LAURA W [[id number]]  . polyethylene glycol powder (GLYCOLAX/MIRALAX) powder    Sig: Take 17 g by mouth daily.    Dispense:  255 g    Refill:  0    Order Specific Question:   Supervising Provider    Answer:   Eustace MooreMURRAY, LAURA W [[id number]]       Hayden Rasmussenavid Dillyn Joaquin, NP 02/27/16 1336    Hayden Rasmussenavid Edona Schreffler, NP 02/27/16 [phone]

## 2016-02-27 NOTE — ED Triage Notes (Signed)
Pt c/o intermittent rectal bleeding onset x1 month  Reports he was seen at a [facility] facility and was treated for internal hemorrhoids   Sx today include rectal pain and occasional bleeding  A&O x4... NAD

## 2016-03-04 ENCOUNTER — Ambulatory Visit (INDEPENDENT_AMBULATORY_CARE_PROVIDER_SITE_OTHER): Payer: Self-pay | Admitting: Urology

## 2016-03-04 DIAGNOSIS — N50812 Left testicular pain: Secondary | ICD-10-CM

## 2016-03-10 ENCOUNTER — Other Ambulatory Visit: Payer: Self-pay | Admitting: Urology

## 2016-03-10 DIAGNOSIS — N50812 Left testicular pain: Secondary | ICD-10-CM

## 2016-03-15 ENCOUNTER — Ambulatory Visit (HOSPITAL_COMMUNITY)
Admission: RE | Admit: 2016-03-15 | Discharge: 2016-03-15 | Disposition: A | Discharge status: Home / Self Care | Payer: Self-pay | Source: Ambulatory Visit | Attending: Urology | Admitting: Urology

## 2016-03-15 DIAGNOSIS — I861 Scrotal varices: Secondary | ICD-10-CM | POA: Insufficient documentation

## 2016-03-15 DIAGNOSIS — N433 Hydrocele, unspecified: Secondary | ICD-10-CM | POA: Insufficient documentation

## 2016-03-15 DIAGNOSIS — N50812 Left testicular pain: Secondary | ICD-10-CM

## 2016-03-18 ENCOUNTER — Ambulatory Visit (INDEPENDENT_AMBULATORY_CARE_PROVIDER_SITE_OTHER): Payer: Self-pay | Admitting: Urology

## 2016-03-18 ENCOUNTER — Other Ambulatory Visit: Payer: Self-pay | Admitting: Urology

## 2016-03-18 DIAGNOSIS — N50812 Left testicular pain: Secondary | ICD-10-CM

## 2016-03-18 DIAGNOSIS — R300 Dysuria: Secondary | ICD-10-CM

## 2016-03-25 ENCOUNTER — Ambulatory Visit (HOSPITAL_COMMUNITY)
Admission: RE | Admit: 2016-03-25 | Discharge: 2016-03-25 | Disposition: A | Discharge status: Home / Self Care | Payer: Self-pay | Source: Ambulatory Visit | Attending: Urology | Admitting: Urology

## 2016-03-25 DIAGNOSIS — Q631 Lobulated, fused and horseshoe kidney: Secondary | ICD-10-CM | POA: Insufficient documentation

## 2016-03-25 DIAGNOSIS — N50812 Left testicular pain: Secondary | ICD-10-CM | POA: Insufficient documentation

## 2016-03-25 DIAGNOSIS — N1330 Unspecified hydronephrosis: Secondary | ICD-10-CM | POA: Insufficient documentation

## 2016-03-28 ENCOUNTER — Encounter: Payer: Self-pay | Admitting: Family Medicine

## 2016-03-28 ENCOUNTER — Ambulatory Visit: Payer: Self-pay | Attending: Family Medicine | Admitting: Family Medicine

## 2016-03-28 VITALS — BP 126/73 | HR 85 | Temp 98.6°F | Ht 69.0 in | Wt 240.4 lb

## 2016-03-28 DIAGNOSIS — E785 Hyperlipidemia, unspecified: Secondary | ICD-10-CM | POA: Insufficient documentation

## 2016-03-28 DIAGNOSIS — R609 Edema, unspecified: Secondary | ICD-10-CM | POA: Insufficient documentation

## 2016-03-28 DIAGNOSIS — Z79899 Other long term (current) drug therapy: Secondary | ICD-10-CM | POA: Insufficient documentation

## 2016-03-28 DIAGNOSIS — I10 Essential (primary) hypertension: Secondary | ICD-10-CM | POA: Insufficient documentation

## 2016-03-28 DIAGNOSIS — Z833 Family history of diabetes mellitus: Secondary | ICD-10-CM | POA: Insufficient documentation

## 2016-03-28 DIAGNOSIS — R600 Localized edema: Secondary | ICD-10-CM

## 2016-03-28 LAB — LIPID PANEL
Cholesterol: 201 mg/dL — ABNORMAL HIGH (ref 125–200)
HDL: 47 mg/dL
LDL Cholesterol: 88 mg/dL
Total CHOL/HDL Ratio: 4.3 ratio
Triglycerides: 330 mg/dL — ABNORMAL HIGH
VLDL: 66 mg/dL — ABNORMAL HIGH

## 2016-03-28 LAB — COMPLETE METABOLIC PANEL WITH GFR
ALBUMIN: 4.7 g/dL (ref 3.6–5.1)
ALK PHOS: 61 U/L (ref 40–115)
ALT: 35 U/L (ref 9–46)
AST: 26 U/L (ref 10–40)
BUN: 11 mg/dL (ref 7–25)
CALCIUM: 9 mg/dL (ref 8.6–10.3)
CO2: 20 mmol/L (ref 20–31)
CREATININE: 0.75 mg/dL (ref 0.60–1.35)
Chloride: 104 mmol/L (ref 98–110)
GFR, Est African American: >89 mL/min (ref >=60)
GFR, Est Non African American: >89 mL/min (ref >=60)
Glucose, Bld: 93 mg/dL (ref 65–99)
POTASSIUM: 4.3 mmol/L (ref 3.5–5.3)
Sodium: 137 mmol/L (ref 135–146)
Total Bilirubin: 0.5 mg/dL (ref 0.2–1.2)
Total Protein: 7.5 g/dL (ref 6.1–8.1)

## 2016-03-28 MED ORDER — ATORVASTATIN CALCIUM 10 MG PO TABS: 10.0000 mg | ORAL_TABLET | Freq: Every day | ORAL | 5 refills | Status: DC

## 2016-03-28 MED ORDER — LISINOPRIL-HYDROCHLOROTHIAZIDE 10-12.5 MG PO TABS: 1.0000 | ORAL_TABLET | Freq: Every day | ORAL | 5 refills | Status: DC

## 2016-03-28 NOTE — Progress Notes (Signed)
Subjective:  Patient ID: [name] [name], male    DOB: 08-07-1980  Age: 35 y.o. MRN: [id number]  CC: Hypertension and Hyperlipidemia   HPI [name] [name] is a 35 year old male with a history of hypertension and Hyperlipidemia who presents today to establish care. He is requesting blood work to screen for diabetes mellitus due to a positive family history. He has also been out of his antihypertensive and will need a refill today. Prior to now he received refills from a "[place] [facility] physician".  He is closely followed by urology for management of Hydrocele and received amitriptylline for scrotal pain.  Allergies  Allergen Reactions  . Doxycycline Rash    No current outpatient prescriptions on file prior to visit.   No current facility-administered medications on file prior to visit.      Outpatient Medications Prior to Visit  Medication Sig Dispense Refill  . atorvastatin (LIPITOR) 10 MG tablet Take 10 mg by mouth daily.    Marland Kitchen. lisinopril-hydrochlorothiazide (PRINZIDE,ZESTORETIC) 10-12.5 MG tablet Take 1 tablet by mouth daily.    . diclofenac (CATAFLAM) 50 MG tablet Take 1 tablet (50 mg total) by mouth 3 (three) times daily. (Patient not taking: Reported on 03/28/2016) 30 tablet 0  . hydrocortisone (ANUSOL-HC) 25 MG suppository Place 1 suppository (25 mg total) rectally 2 (two) times daily. (Patient not taking: Reported on 03/28/2016) 12 suppository 0  . hydrocortisone-pramoxine (ANALPRAM HC) 2.5-1 % rectal cream Place 1 application rectally 3 (three) times daily. (Patient not taking: Reported on 03/28/2016) 30 g 0  . naproxen (NAPROSYN) 500 MG tablet Take 1 tablet (500 mg total) by mouth 2 (two) times daily. (Patient not taking: Reported on 03/28/2016) 30 tablet 0  . polyethylene glycol powder (GLYCOLAX/MIRALAX) powder Take 17 g by mouth daily. (Patient not taking: Reported on 03/28/2016) 255 g 0   No facility-administered medications prior to visit.     ROS Review of Systems    Constitutional: Negative for activity change and appetite change.  HENT: Negative for sinus pressure and sore throat.   Eyes: Negative for visual disturbance.  Respiratory: Negative for cough, chest tightness and shortness of breath.   Cardiovascular: Negative for chest pain and leg swelling.  Gastrointestinal: Negative for abdominal distention, abdominal pain, constipation and diarrhea.  Endocrine: Negative.   Genitourinary: Negative for dysuria.  Musculoskeletal: Negative for joint swelling and myalgias.  Skin: Negative for rash.  Allergic/Immunologic: Negative.   Neurological: Negative for weakness, light-headedness and numbness.  Psychiatric/Behavioral: Negative for dysphoric mood and suicidal ideas.    Objective:  BP 126/73 (BP Location: Right Arm, Patient Position: Sitting, Cuff Size: Large)   Pulse 85   Temp 98.6 F (37 C) (Oral)   Ht 5\' 9"  (1.753 m)   Wt 240 lb 6.4 oz (109 kg)   SpO2 96%   BMI 35.50 kg/m   BP/Weight 03/28/2016 02/27/2016 02/10/2016  Systolic BP 126 117 119  Diastolic BP 73 67 79  Wt. (Lbs) 240.4 - -  BMI 35.5 - -      Physical Exam  Constitutional: He is oriented to person, place, and time. He appears well-developed and well-nourished.  Cardiovascular: Normal rate, normal heart sounds and intact distal pulses.   No murmur heard. Pulmonary/Chest: Effort normal and breath sounds normal. He has no wheezes. He has no rales. He exhibits no tenderness.  Abdominal: Soft. Bowel sounds are normal. He exhibits no distension and no mass. There is no tenderness.  Musculoskeletal: Normal range of motion.  Neurological: He is alert  and oriented to person, place, and time.     Assessment & Plan:   1. Essential hypertension Controlled - COMPLETE METABOLIC PANEL WITH GFR - Lipid panel - HgB A1c  2. Hyperlipidemia Continue Statin  3. Pedal edema Likely dependent and also due to the fact that he has been out of his lisinopril/HCTZ Low-sodium diet, DASH  diet   Meds ordered this encounter  Medications  . atorvastatin (LIPITOR) 10 MG tablet    Sig: Take 1 tablet (10 mg total) by mouth daily.    Dispense:  30 tablet    Refill:  5  . lisinopril-hydrochlorothiazide (PRINZIDE,ZESTORETIC) 10-12.5 MG tablet    Sig: Take 1 tablet by mouth daily.    Dispense:  30 tablet    Refill:  5    Follow-up: Return in about 3 weeks (around 04/18/2016) for follow up on pedal edema.   Jaclyn Shaggy MD

## 2016-03-28 NOTE — Progress Notes (Signed)
Wanting to become established- realizes that he is very late for appt Mother is diabetic Would like testing to r/o diabetes- patient is fasting Not a good historian of meds.

## 2016-03-29 ENCOUNTER — Telehealth: Payer: Self-pay

## 2016-03-29 ENCOUNTER — Ambulatory Visit: Payer: Self-pay | Attending: Internal Medicine

## 2016-03-29 DIAGNOSIS — I10 Essential (primary) hypertension: Secondary | ICD-10-CM | POA: Insufficient documentation

## 2016-03-29 DIAGNOSIS — E785 Hyperlipidemia, unspecified: Secondary | ICD-10-CM | POA: Insufficient documentation

## 2016-03-29 DIAGNOSIS — R600 Localized edema: Secondary | ICD-10-CM | POA: Insufficient documentation

## 2016-03-29 LAB — POCT GLYCOSYLATED HEMOGLOBIN (HGB A1C): Hemoglobin A1C: 5.4

## 2016-03-29 NOTE — Telephone Encounter (Signed)
Through [employer] patient was called with lab results and MD's recommendations.

## 2016-03-29 NOTE — Telephone Encounter (Signed)
-----   Message from Jaclyn ShaggyEnobong Amao, MD sent at 03/29/2016  9:17 AM EDT ----- Labs are negative for Diabetes, cholesterol is normal but triglycerides are slightly elevated; advise to use OTC fish oil caps

## 2016-03-29 NOTE — Addendum Note (Signed)
Addended by: Minerva AreolaLIEB, Antigone Crowell E on: 03/29/2016 04:33 PM   Modules accepted: Orders

## 2016-03-29 NOTE — Progress Notes (Signed)
Patient here for lab work only 

## 2016-04-06 ENCOUNTER — Ambulatory Visit: Payer: Self-pay | Attending: Family Medicine

## 2016-04-29 ENCOUNTER — Other Ambulatory Visit: Payer: Self-pay | Admitting: Urology

## 2016-04-29 ENCOUNTER — Ambulatory Visit (INDEPENDENT_AMBULATORY_CARE_PROVIDER_SITE_OTHER): Payer: Self-pay | Admitting: Urology

## 2016-04-29 DIAGNOSIS — Q631 Lobulated, fused and horseshoe kidney: Secondary | ICD-10-CM

## 2016-04-29 DIAGNOSIS — Q6211 Congenital occlusion of ureteropelvic junction: Principal | ICD-10-CM

## 2016-04-29 DIAGNOSIS — Q6239 Other obstructive defects of renal pelvis and ureter: Secondary | ICD-10-CM

## 2016-05-02 ENCOUNTER — Other Ambulatory Visit: Payer: Self-pay | Admitting: Urology

## 2016-05-02 ENCOUNTER — Other Ambulatory Visit (HOSPITAL_COMMUNITY): Payer: Self-pay | Admitting: Urology

## 2016-05-02 DIAGNOSIS — Q6239 Other obstructive defects of renal pelvis and ureter: Secondary | ICD-10-CM

## 2016-05-02 DIAGNOSIS — Q6211 Congenital occlusion of ureteropelvic junction: Principal | ICD-10-CM

## 2016-05-06 ENCOUNTER — Encounter (HOSPITAL_COMMUNITY)
Admission: RE | Admit: 2016-05-06 | Discharge: 2016-05-06 | Disposition: A | Discharge status: Home / Self Care | Payer: Self-pay | Source: Ambulatory Visit | Attending: Urology | Admitting: Urology

## 2016-05-06 ENCOUNTER — Telehealth: Payer: Self-pay | Admitting: Family Medicine

## 2016-05-06 ENCOUNTER — Encounter (HOSPITAL_COMMUNITY): Payer: Self-pay

## 2016-05-06 ENCOUNTER — Ambulatory Visit (HOSPITAL_COMMUNITY)
Admission: RE | Admit: 2016-05-06 | Discharge: 2016-05-06 | Disposition: A | Discharge status: Home / Self Care | Payer: Self-pay | Source: Ambulatory Visit | Attending: Urology | Admitting: Urology

## 2016-05-06 DIAGNOSIS — Q6211 Congenital occlusion of ureteropelvic junction: Secondary | ICD-10-CM | POA: Insufficient documentation

## 2016-05-06 DIAGNOSIS — I10 Essential (primary) hypertension: Secondary | ICD-10-CM

## 2016-05-06 DIAGNOSIS — R600 Localized edema: Secondary | ICD-10-CM

## 2016-05-06 DIAGNOSIS — Q6239 Other obstructive defects of renal pelvis and ureter: Secondary | ICD-10-CM

## 2016-05-06 DIAGNOSIS — Q631 Lobulated, fused and horseshoe kidney: Secondary | ICD-10-CM | POA: Insufficient documentation

## 2016-05-06 DIAGNOSIS — E785 Hyperlipidemia, unspecified: Secondary | ICD-10-CM

## 2016-05-06 MED ORDER — IOPAMIDOL (ISOVUE-300) INJECTION 61%
100.0000 mL | Freq: Once | INTRAVENOUS | Status: AC | PRN
  Administered 2016-05-06: 100 mL via INTRAVENOUS

## 2016-05-06 MED ORDER — TECHNETIUM TC 99M MERTIATIDE
4.9000 | Freq: Once | INTRAVENOUS | Status: AC | PRN
  Administered 2016-05-06: 4.9 via INTRAVENOUS

## 2016-05-06 MED ORDER — FUROSEMIDE 10 MG/ML IJ SOLN
52.0000 mg | Freq: Once | INTRAMUSCULAR | Status: AC
  Administered 2016-05-06: 52 mg via INTRAVENOUS

## 2016-05-06 MED ORDER — LISINOPRIL-HYDROCHLOROTHIAZIDE 10-12.5 MG PO TABS: 1.0000 | ORAL_TABLET | Freq: Every day | ORAL | 2 refills | Status: DC

## 2016-05-06 MED ORDER — FUROSEMIDE 10 MG/ML IJ SOLN
INTRAMUSCULAR | Status: AC
  Filled 2016-05-06: qty 8

## 2016-05-06 NOTE — Telephone Encounter (Signed)
Patient came by pass the office to request medication refill for lisinopril-hydrochlorothiazide (PRINZIDE,ZESTORETIC) 10-12.5 MG tablet. Please call it in to our pharmacy [facility]  Thank you.

## 2016-05-06 NOTE — Telephone Encounter (Signed)
Requested medication refilled

## 2016-05-19 ENCOUNTER — Other Ambulatory Visit: Payer: Self-pay | Admitting: Urology

## 2016-06-03 ENCOUNTER — Ambulatory Visit: Payer: Self-pay | Attending: Family Medicine | Admitting: Family Medicine

## 2016-06-03 ENCOUNTER — Encounter: Payer: Self-pay | Admitting: Family Medicine

## 2016-06-03 VITALS — BP 108/61 | HR 70 | Temp 98.2°F | Ht 68.0 in | Wt 238.0 lb

## 2016-06-03 DIAGNOSIS — Z881 Allergy status to other antibiotic agents status: Secondary | ICD-10-CM | POA: Insufficient documentation

## 2016-06-03 DIAGNOSIS — Z79899 Other long term (current) drug therapy: Secondary | ICD-10-CM | POA: Insufficient documentation

## 2016-06-03 DIAGNOSIS — M546 Pain in thoracic spine: Secondary | ICD-10-CM

## 2016-06-03 DIAGNOSIS — E7800 Pure hypercholesterolemia, unspecified: Secondary | ICD-10-CM

## 2016-06-03 DIAGNOSIS — I10 Essential (primary) hypertension: Secondary | ICD-10-CM

## 2016-06-03 DIAGNOSIS — Z23 Encounter for immunization: Secondary | ICD-10-CM

## 2016-06-03 MED ORDER — CYCLOBENZAPRINE HCL 10 MG PO TABS: 10.0000 mg | ORAL_TABLET | Freq: Three times a day (TID) | ORAL | 0 refills | Status: DC | PRN

## 2016-06-03 MED ORDER — LISINOPRIL-HYDROCHLOROTHIAZIDE 10-12.5 MG PO TABS: 1.0000 | ORAL_TABLET | Freq: Every day | ORAL | 5 refills | Status: DC

## 2016-06-03 MED ORDER — ATORVASTATIN CALCIUM 10 MG PO TABS: 10.0000 mg | ORAL_TABLET | Freq: Every day | ORAL | 5 refills | Status: DC

## 2016-06-03 NOTE — Progress Notes (Signed)
Subjective:  Patient ID: [name] [name], male    DOB: 1981/05/14  Age: 35 y.o. MRN: [id number]  CC: Follow-up (feet edema) and Back Pain   HPI [name] [name] is a 35 year old male with a history of hypertension, hyperlipidemia who comes into the clinic for a follow-up visit. He endorses compliance with his medications and the pedal edema which he had complained of at his last office visit has resolved.  Compliant with low-sodium, low-cholesterol diet as well as exercise.  He complains of a one-month history of burning pain on the right side of his back which is worse when he bends over but relieved by sitting upright and lying down flat. Pain is absent at this time. He denies presence of rash that location elsewhere.  Past Medical History:  Diagnosis Date  . Hypertension     History reviewed. No pertinent surgical history.  Allergies  Allergen Reactions  . Doxycycline Rash     Outpatient Medications Prior to Visit  Medication Sig Dispense Refill  . atorvastatin (LIPITOR) 10 MG tablet Take 1 tablet (10 mg total) by mouth daily. 30 tablet 5  . lisinopril-hydrochlorothiazide (PRINZIDE,ZESTORETIC) 10-12.5 MG tablet Take 1 tablet by mouth daily. 30 tablet 2  . amitriptyline (ELAVIL) 25 MG tablet Take 25 mg by mouth at bedtime.     No facility-administered medications prior to visit.     ROS Review of Systems  Constitutional: Negative for activity change and appetite change.  HENT: Negative for sinus pressure and sore throat.   Eyes: Negative for visual disturbance.  Respiratory: Negative for cough, chest tightness and shortness of breath.   Cardiovascular: Negative for chest pain and leg swelling.  Gastrointestinal: Negative for abdominal distention, abdominal pain, constipation and diarrhea.  Endocrine: Negative.   Genitourinary: Negative for dysuria.  Musculoskeletal: Positive for back pain. Negative for joint swelling and myalgias.  Skin: Negative for rash.    Allergic/Immunologic: Negative.   Neurological: Negative for weakness, light-headedness and numbness.  Psychiatric/Behavioral: Negative for dysphoric mood and suicidal ideas.    Objective:  BP 108/61 (BP Location: Right Arm, Patient Position: Sitting, Cuff Size: Large)   Pulse 70   Temp 98.2 F (36.8 C) (Oral)   Ht 5\' 8"  (1.727 m)   Wt 238 lb (108 kg)   SpO2 98%   BMI 36.19 kg/m   BP/Weight 06/03/2016 05/06/2016 03/28/2016  Systolic BP 108 - 126  Diastolic BP 61 - 73  Wt. (Lbs) 238 228 240.4  BMI 36.19 33.67 35.5      Physical Exam  Constitutional: He is oriented to person, place, and time. He appears well-developed and well-nourished.  Neck: No JVD present.  Cardiovascular: Normal rate, normal heart sounds and intact distal pulses.   No murmur heard. Pulmonary/Chest: Effort normal and breath sounds normal. He has no wheezes. He has no rales. He exhibits no tenderness.  Abdominal: Soft. Bowel sounds are normal. He exhibits no distension and no mass. There is no tenderness.  Musculoskeletal: Normal range of motion.  Neurological: He is alert and oriented to person, place, and time.  Skin: Skin is warm and dry.  Psychiatric: He has a normal mood and affect.     CMP Latest Ref Rng & Units 03/28/2016  Glucose 65 - 99 mg/dL 93  BUN 7 - 25 mg/dL 11  Creatinine 4.090.60 - 8.111.35 mg/dL 9.140.75  Sodium 782135 - 956146 mmol/L 137  Potassium 3.5 - 5.3 mmol/L 4.3  Chloride 98 - 110 mmol/L 104  CO2 20 - 31 mmol/L  20  Calcium 8.6 - 10.3 mg/dL 9.0  Total Protein 6.1 - 8.1 g/dL 7.5  Total Bilirubin 0.2 - 1.2 mg/dL 0.5  Alkaline Phos 40 - 115 U/L 61  AST 10 - 40 U/L 26  ALT 9 - 46 U/L 35    Lipid Panel     Component Value Date/Time   CHOL 201 (H) 03/28/2016 1229   TRIG 330 (H) 03/28/2016 1229   HDL 47 03/28/2016 1229   CHOLHDL 4.3 03/28/2016 1229   VLDL 66 (H) 03/28/2016 1229   LDLCALC 88 03/28/2016 1229    Assessment & Plan:   1. Essential hypertension Controlled -  lisinopril-hydrochlorothiazide (PRINZIDE,ZESTORETIC) 10-12.5 MG tablet; Take 1 tablet by mouth daily.  Dispense: 30 tablet; Refill: 5  2. Pure hypercholesterolemia Controlled - atorvastatin (LIPITOR) 10 MG tablet; Take 1 tablet (10 mg total) by mouth daily.  Dispense: 30 tablet; Refill: 5  3. Acute right-sided thoracic back pain Likely muscle spasm Apply heat - cyclobenzaprine (FLEXERIL) 10 MG tablet; Take 1 tablet (10 mg total) by mouth 3 (three) times daily as needed for muscle spasms.  Dispense: 30 tablet; Refill: 0  4. Encounter for immunization - Flu Vaccine QUAD 36+ mos IM   Meds ordered this encounter  Medications  . atorvastatin (LIPITOR) 10 MG tablet    Sig: Take 1 tablet (10 mg total) by mouth daily.    Dispense:  30 tablet    Refill:  5  . lisinopril-hydrochlorothiazide (PRINZIDE,ZESTORETIC) 10-12.5 MG tablet    Sig: Take 1 tablet by mouth daily.    Dispense:  30 tablet    Refill:  5  . cyclobenzaprine (FLEXERIL) 10 MG tablet    Sig: Take 1 tablet (10 mg total) by mouth 3 (three) times daily as needed for muscle spasms.    Dispense:  30 tablet    Refill:  0    Follow-up: Return in about 3 months (around 09/01/2016) for follow up on hypertension.   Jaclyn ShaggyEnobong Amao MD

## 2016-06-28 NOTE — Patient Instructions (Addendum)
[name] [name]  06/28/2016   Your procedure is scheduled on: Friday 07/08/2016  [facility]  Entrance [place]  elevators to 3rd floor to  Short Stay Center at 0845  AM.  Call this number if you have problems the morning of surgery [phone]   Remember: ONLY 1 PERSON MAY GO WITH YOU TO SHORT STAY TO GET  READY MORNING OF YOUR SURGERY.                FOLLOW BOWEL PREP INSTRUCTIONS FROM DR. MANNY'S OFFICE THE DAY BEFORE SURGERY ON Thursday 07/07/2016.              FOLLOW A CLEAR LIQUID DIET ALL DAY TIL MIDNIGHT THE DAY BEFORE SURGERY!              DRINK ONE BOTTLE OF MAGNESIUM CITRATE BY NOON THE DAY BEFORE SURGERY  AND FOLLOW A CLEAR LIQUID DIET ALL DAY!     CLEAR LIQUID DIET   Foods Allowed                                                                     Foods Excluded  Coffee and tea, regular and decaf                             liquids that you cannot  Plain Jell-O in any flavor                                             see through such as: Fruit ices (not with fruit pulp)                                     milk, soups, orange juice  Iced Popsicles                                    All solid food Carbonated beverages, regular and diet                                    Cranberry, grape and apple juices Sports drinks like Gatorade Lightly seasoned clear broth or consume(fat free) Sugar, honey syrup  Sample Menu Breakfast                                Lunch                                     Supper Cranberry juice                    Beef broth  Chicken broth Jell-O                                     Grape juice                           Apple juice Coffee or tea                        Jell-O                                      Popsicle                                                Coffee or tea                        Coffee or  tea  _____________________________________________________________________     Do not eat food or drink liquids :After Midnight.     Take these medicines the morning of surgery with A SIP OF WATER: none                                 You may not have any metal on your body including hair pins and              piercings  Do not wear jewelry, make-up, lotions, powders or perfumes, deodorant             Do not wear nail polish.  Do not shave  48 hours prior to surgery.              Men may shave face and neck.   Do not bring valuables to the hospital. [facility] IS NOT             RESPONSIBLE   FOR VALUABLES.  Contacts, dentures or bridgework may not be worn into surgery.  Leave suitcase in the car. After surgery it may be brought to your room.                  Please read over the following fact sheets you were given: _____________________________________________________________________             [facility] - Preparing for [facility] Before surgery, you can play an important role.  Because skin is not sterile, your skin needs to be as free of germs as possible.  You can reduce the number of germs on your skin by washing with CHG (chlorahexidine gluconate) soap before surgery.  CHG is an antiseptic cleaner which kills germs and bonds with the skin to continue killing germs even after washing. Please DO NOT use if you have an allergy to CHG or antibacterial soaps.  If your skin becomes reddened/irritated stop using the CHG and inform your nurse when you arrive at Short Stay. Do not shave (including legs and underarms) for at least 48 hours prior to the first CHG shower.  You may shave your face/neck. Please follow these instructions carefully:  1.  Shower with CHG Soap the night before  surgery and the  morning of Surgery.  2.  If you choose to wash your hair, wash your hair first as usual with your  normal  shampoo.  3.  After you shampoo, rinse your hair and body thoroughly to  remove the  shampoo.                           4.  Use CHG as you would any other liquid soap.  You can apply chg directly  to the skin and wash                       Gently with a scrungie or clean washcloth.  5.  Apply the CHG Soap to your body ONLY FROM THE NECK DOWN.   Do not use on face/ open                           Wound or open sores. Avoid contact with eyes, ears mouth and genitals (private parts).                       Wash face,  Genitals (private parts) with your normal soap.             6.  Wash thoroughly, paying special attention to the area where your surgery  will be performed.  7.  Thoroughly rinse your body with warm water from the neck down.  8.  DO NOT shower/wash with your normal soap after using and rinsing off  the CHG Soap.                9.  Pat yourself dry with a clean towel.            10.  Wear clean pajamas.            11.  Place clean sheets on your bed the night of your first shower and do not  sleep with pets. Day of Surgery : Do not apply any lotions/deodorants the morning of surgery.  Please wear clean clothes to the hospital/surgery center.  FAILURE TO FOLLOW THESE INSTRUCTIONS MAY RESULT IN THE CANCELLATION OF YOUR SURGERY PATIENT SIGNATURE_________________________________  NURSE SIGNATURE__________________________________  ________________________________________________________________________

## 2016-06-30 ENCOUNTER — Encounter (HOSPITAL_COMMUNITY): Payer: Self-pay

## 2016-06-30 ENCOUNTER — Encounter (HOSPITAL_COMMUNITY)
Admission: RE | Admit: 2016-06-30 | Discharge: 2016-06-30 | Disposition: A | Discharge status: Home / Self Care | Payer: Self-pay | Source: Ambulatory Visit | Attending: Urology | Admitting: Urology

## 2016-06-30 DIAGNOSIS — I10 Essential (primary) hypertension: Secondary | ICD-10-CM | POA: Insufficient documentation

## 2016-06-30 DIAGNOSIS — N135 Crossing vessel and stricture of ureter without hydronephrosis: Secondary | ICD-10-CM | POA: Insufficient documentation

## 2016-06-30 DIAGNOSIS — Z01812 Encounter for preprocedural laboratory examination: Secondary | ICD-10-CM | POA: Insufficient documentation

## 2016-06-30 DIAGNOSIS — Z01818 Encounter for other preprocedural examination: Secondary | ICD-10-CM | POA: Insufficient documentation

## 2016-06-30 DIAGNOSIS — R9431 Abnormal electrocardiogram [ECG] [EKG]: Secondary | ICD-10-CM | POA: Insufficient documentation

## 2016-06-30 DIAGNOSIS — Q631 Lobulated, fused and horseshoe kidney: Secondary | ICD-10-CM | POA: Insufficient documentation

## 2016-06-30 LAB — BASIC METABOLIC PANEL
ANION GAP: 9 (ref 5–15)
BUN: 14 mg/dL (ref 6–20)
CHLORIDE: 105 mmol/L (ref 101–111)
CO2: 27 mmol/L (ref 22–32)
CREATININE: 0.75 mg/dL (ref 0.61–1.24)
Calcium: 9.3 mg/dL (ref 8.9–10.3)
GFR calc non Af Amer: >60 mL/min (ref >60)
GLUCOSE: 103 mg/dL — AB (ref 65–99)
Potassium: 4.2 mmol/L (ref 3.5–5.1)
Sodium: 141 mmol/L (ref 135–145)

## 2016-06-30 LAB — CBC
HCT: 45.7 % (ref 39.0–52.0)
HEMOGLOBIN: 15.2 g/dL (ref 13.0–17.0)
MCH: 29 pg (ref 26.0–34.0)
MCHC: 33.3 g/dL (ref 30.0–36.0)
MCV: 87 fL (ref 78.0–100.0)
Platelets: 212 10*3/uL (ref 150–400)
RBC: 5.25 MIL/uL (ref 4.22–5.81)
RDW: 13.6 % (ref 11.5–15.5)
WBC: 6.1 10*3/uL (ref 4.0–10.5)

## 2016-06-30 NOTE — Progress Notes (Signed)
Consulted Dr. Cristela BlueKyle Jackson, Anesthesia about EKG results from today. Dr. Cristela BlueKyle Jackson states" Is OK for surgery".

## 2016-07-06 ENCOUNTER — Ambulatory Visit: Payer: Self-pay | Attending: Family Medicine

## 2016-07-08 ENCOUNTER — Inpatient Hospital Stay (HOSPITAL_COMMUNITY): Payer: Self-pay

## 2016-07-08 ENCOUNTER — Encounter (HOSPITAL_COMMUNITY): Payer: Self-pay | Admitting: *Deleted

## 2016-07-08 ENCOUNTER — Inpatient Hospital Stay (HOSPITAL_COMMUNITY): Payer: Self-pay | Admitting: Certified Registered"

## 2016-07-08 ENCOUNTER — Inpatient Hospital Stay (HOSPITAL_COMMUNITY)
Admission: RE | Admit: 2016-07-08 | Discharge: 2016-07-10 | DRG: 661 | Disposition: A | Discharge status: Home / Self Care | Payer: Self-pay | Source: Ambulatory Visit | Attending: Urology | Admitting: Urology

## 2016-07-08 ENCOUNTER — Encounter (HOSPITAL_COMMUNITY)
Admission: RE | Disposition: A | Discharge status: Home / Self Care | Payer: Self-pay | Source: Ambulatory Visit | Attending: Urology

## 2016-07-08 DIAGNOSIS — Z419 Encounter for procedure for purposes other than remedying health state, unspecified: Secondary | ICD-10-CM

## 2016-07-08 DIAGNOSIS — E785 Hyperlipidemia, unspecified: Secondary | ICD-10-CM | POA: Diagnosis present

## 2016-07-08 DIAGNOSIS — Q631 Lobulated, fused and horseshoe kidney: Secondary | ICD-10-CM

## 2016-07-08 DIAGNOSIS — N5082 Scrotal pain: Secondary | ICD-10-CM | POA: Diagnosis present

## 2016-07-08 DIAGNOSIS — N135 Crossing vessel and stricture of ureter without hydronephrosis: Principal | ICD-10-CM | POA: Diagnosis present

## 2016-07-08 DIAGNOSIS — I10 Essential (primary) hypertension: Secondary | ICD-10-CM | POA: Diagnosis present

## 2016-07-08 HISTORY — PX: ROBOT ASSISTED PYELOPLASTY: SHX5143

## 2016-07-08 HISTORY — PX: CYSTOSCOPY W/ RETROGRADES: SHX1426

## 2016-07-08 LAB — BASIC METABOLIC PANEL
Anion gap: 9 (ref 5–15)
BUN: 12 mg/dL (ref 6–20)
CALCIUM: 9.1 mg/dL (ref 8.9–10.3)
CO2: 27 mmol/L (ref 22–32)
Chloride: 101 mmol/L (ref 101–111)
Creatinine, Ser: 0.99 mg/dL (ref 0.61–1.24)
GFR calc Af Amer: >60 mL/min (ref >60)
GLUCOSE: 128 mg/dL — AB (ref 65–99)
Potassium: 4.1 mmol/L (ref 3.5–5.1)
SODIUM: 137 mmol/L (ref 135–145)

## 2016-07-08 LAB — HEMOGLOBIN AND HEMATOCRIT, BLOOD
HEMATOCRIT: 41.8 % (ref 39.0–52.0)
HEMOGLOBIN: 14.2 g/dL (ref 13.0–17.0)

## 2016-07-08 SURGERY — XI ROBOTIC ASSISTED PYELOPLASTY WITH STENT PLACEMENT
Anesthesia: General | Laterality: Left

## 2016-07-08 MED ORDER — LACTATED RINGERS IV SOLN
INTRAVENOUS | Status: DC
  Administered 2016-07-08 – 2016-07-09 (×2): via INTRAVENOUS

## 2016-07-08 MED ORDER — HYDROMORPHONE HCL 2 MG/ML IJ SOLN
INTRAMUSCULAR | Status: AC
  Filled 2016-07-08: qty 1

## 2016-07-08 MED ORDER — BUPIVACAINE LIPOSOME 1.3 % IJ SUSP
INTRAMUSCULAR | Status: AC
  Filled 2016-07-08: qty 20

## 2016-07-08 MED ORDER — LIDOCAINE 2% (20 MG/ML) 5 ML SYRINGE
INTRAMUSCULAR | Status: DC | PRN
  Administered 2016-07-08: 100 mg via INTRAVENOUS

## 2016-07-08 MED ORDER — PROMETHAZINE HCL 25 MG/ML IJ SOLN: 6.2500 mg | INTRAMUSCULAR | Status: DC | PRN

## 2016-07-08 MED ORDER — LISINOPRIL-HYDROCHLOROTHIAZIDE 10-12.5 MG PO TABS: 1.0000 | ORAL_TABLET | Freq: Every day | ORAL | Status: DC

## 2016-07-08 MED ORDER — ONDANSETRON HCL 4 MG/2ML IJ SOLN
INTRAMUSCULAR | Status: DC | PRN
Start: 2016-07-08 — End: 2016-07-08
  Administered 2016-07-08: 4 mg via INTRAVENOUS

## 2016-07-08 MED ORDER — MIDAZOLAM HCL 5 MG/5ML IJ SOLN
INTRAMUSCULAR | Status: DC | PRN
  Administered 2016-07-08: 2 mg via INTRAVENOUS

## 2016-07-08 MED ORDER — HYDROMORPHONE HCL 1 MG/ML IJ SOLN
INTRAMUSCULAR | Status: DC | PRN
  Administered 2016-07-08: 1 mg via INTRAVENOUS

## 2016-07-08 MED ORDER — FENTANYL CITRATE (PF) 100 MCG/2ML IJ SOLN
INTRAMUSCULAR | Status: DC | PRN
  Administered 2016-07-08: 50 ug via INTRAVENOUS
  Administered 2016-07-08 (×2): 100 ug via INTRAVENOUS

## 2016-07-08 MED ORDER — TAMSULOSIN HCL 0.4 MG PO CAPS: 0.4000 mg | ORAL_CAPSULE | Freq: Every day | ORAL | 0 refills | Status: DC

## 2016-07-08 MED ORDER — SODIUM CHLORIDE 0.9 % IJ SOLN
INTRAMUSCULAR | Status: DC | PRN
  Administered 2016-07-08: 20 mL

## 2016-07-08 MED ORDER — ONDANSETRON HCL 4 MG/2ML IJ SOLN
INTRAMUSCULAR | Status: AC
  Filled 2016-07-08: qty 2

## 2016-07-08 MED ORDER — LACTATED RINGERS IR SOLN
Status: DC | PRN
  Administered 2016-07-08: 3000 mL

## 2016-07-08 MED ORDER — HYDROMORPHONE HCL 1 MG/ML IJ SOLN
INTRAMUSCULAR | Status: AC
  Filled 2016-07-08: qty 1

## 2016-07-08 MED ORDER — OXYCODONE HCL 5 MG PO TABS
5.0000 mg | ORAL_TABLET | ORAL | Status: DC | PRN
  Filled 2016-07-08: qty 1

## 2016-07-08 MED ORDER — HYDROCHLOROTHIAZIDE 12.5 MG PO CAPS
12.5000 mg | ORAL_CAPSULE | Freq: Every day | ORAL | Status: DC
  Administered 2016-07-09 – 2016-07-10 (×2): 12.5 mg via ORAL
  Filled 2016-07-08 (×2): qty 1

## 2016-07-08 MED ORDER — SUCCINYLCHOLINE CHLORIDE 200 MG/10ML IV SOSY
PREFILLED_SYRINGE | INTRAVENOUS | Status: DC | PRN
  Administered 2016-07-08: 160 mg via INTRAVENOUS

## 2016-07-08 MED ORDER — OXYCODONE-ACETAMINOPHEN 5-325 MG PO TABS: 1.0000 | ORAL_TABLET | ORAL | 0 refills | Status: DC | PRN

## 2016-07-08 MED ORDER — LIDOCAINE 2% (20 MG/ML) 5 ML SYRINGE
INTRAMUSCULAR | Status: AC
  Filled 2016-07-08: qty 5

## 2016-07-08 MED ORDER — ATORVASTATIN CALCIUM 10 MG PO TABS
10.0000 mg | ORAL_TABLET | Freq: Every day | ORAL | Status: DC
  Administered 2016-07-09 – 2016-07-10 (×2): 10 mg via ORAL
  Filled 2016-07-08 (×2): qty 1

## 2016-07-08 MED ORDER — PHENYLEPHRINE 40 MCG/ML (10ML) SYRINGE FOR IV PUSH (FOR BLOOD PRESSURE SUPPORT)
PREFILLED_SYRINGE | INTRAVENOUS | Status: DC | PRN
  Administered 2016-07-08: 80 ug via INTRAVENOUS

## 2016-07-08 MED ORDER — PHENYLEPHRINE 40 MCG/ML (10ML) SYRINGE FOR IV PUSH (FOR BLOOD PRESSURE SUPPORT)
PREFILLED_SYRINGE | INTRAVENOUS | Status: AC
  Filled 2016-07-08: qty 10

## 2016-07-08 MED ORDER — DEXAMETHASONE SODIUM PHOSPHATE 10 MG/ML IJ SOLN
INTRAMUSCULAR | Status: AC
  Filled 2016-07-08: qty 1

## 2016-07-08 MED ORDER — DOCUSATE SODIUM 100 MG PO CAPS
100.0000 mg | ORAL_CAPSULE | Freq: Two times a day (BID) | ORAL | Status: DC
Start: 2016-07-08 — End: 2016-07-10
  Administered 2016-07-08 – 2016-07-10 (×4): 100 mg via ORAL
  Filled 2016-07-08 (×4): qty 1

## 2016-07-08 MED ORDER — DEXAMETHASONE SODIUM PHOSPHATE 10 MG/ML IJ SOLN
INTRAMUSCULAR | Status: DC | PRN
  Administered 2016-07-08: 10 mg via INTRAVENOUS

## 2016-07-08 MED ORDER — BUPIVACAINE LIPOSOME 1.3 % IJ SUSP
INTRAMUSCULAR | Status: DC | PRN
  Administered 2016-07-08: 20 mL

## 2016-07-08 MED ORDER — HYDROMORPHONE HCL 1 MG/ML IJ SOLN
0.2500 mg | INTRAMUSCULAR | Status: DC | PRN
  Administered 2016-07-08: 0.5 mg via INTRAVENOUS

## 2016-07-08 MED ORDER — WATER FOR IRRIGATION, STERILE IR SOLN
Status: DC | PRN
  Administered 2016-07-08: 3000 mL

## 2016-07-08 MED ORDER — IOPAMIDOL (ISOVUE-300) INJECTION 61%
INTRAVENOUS | Status: DC | PRN
  Administered 2016-07-08: 20 mL

## 2016-07-08 MED ORDER — LACTATED RINGERS IV SOLN
INTRAVENOUS | Status: DC
  Administered 2016-07-08 (×2): via INTRAVENOUS
  Administered 2016-07-08: 1000 mL via INTRAVENOUS

## 2016-07-08 MED ORDER — PROPOFOL 10 MG/ML IV BOLUS
INTRAVENOUS | Status: DC | PRN
  Administered 2016-07-08: 200 mg via INTRAVENOUS

## 2016-07-08 MED ORDER — ROCURONIUM BROMIDE 50 MG/5ML IV SOSY
PREFILLED_SYRINGE | INTRAVENOUS | Status: AC
  Filled 2016-07-08: qty 5

## 2016-07-08 MED ORDER — MEPERIDINE HCL 50 MG/ML IJ SOLN: 6.2500 mg | INTRAMUSCULAR | Status: DC | PRN

## 2016-07-08 MED ORDER — ACETAMINOPHEN 500 MG PO TABS
1000.0000 mg | ORAL_TABLET | Freq: Four times a day (QID) | ORAL | Status: AC
  Administered 2016-07-08 – 2016-07-09 (×4): 1000 mg via ORAL
  Filled 2016-07-08 (×4): qty 2

## 2016-07-08 MED ORDER — ROCURONIUM BROMIDE 10 MG/ML (PF) SYRINGE
PREFILLED_SYRINGE | INTRAVENOUS | Status: DC | PRN
  Administered 2016-07-08: 20 mg via INTRAVENOUS
  Administered 2016-07-08 (×2): 10 mg via INTRAVENOUS
  Administered 2016-07-08: 20 mg via INTRAVENOUS
  Administered 2016-07-08: 45 mg via INTRAVENOUS
  Administered 2016-07-08: 10 mg via INTRAVENOUS
  Administered 2016-07-08: 5 mg via INTRAVENOUS

## 2016-07-08 MED ORDER — SUGAMMADEX SODIUM 500 MG/5ML IV SOLN
INTRAVENOUS | Status: DC | PRN
  Administered 2016-07-08: 250 mg via INTRAVENOUS

## 2016-07-08 MED ORDER — ROCURONIUM BROMIDE 50 MG/5ML IV SOSY
PREFILLED_SYRINGE | INTRAVENOUS | Status: AC
Start: 2016-07-08 — End: 2016-07-08
  Filled 2016-07-08: qty 5

## 2016-07-08 MED ORDER — PROPOFOL 10 MG/ML IV BOLUS
INTRAVENOUS | Status: AC
  Filled 2016-07-08: qty 20

## 2016-07-08 MED ORDER — MIDAZOLAM HCL 2 MG/2ML IJ SOLN
INTRAMUSCULAR | Status: AC
  Filled 2016-07-08: qty 2

## 2016-07-08 MED ORDER — LISINOPRIL 10 MG PO TABS
10.0000 mg | ORAL_TABLET | Freq: Every day | ORAL | Status: DC
  Administered 2016-07-09 – 2016-07-10 (×2): 10 mg via ORAL
  Filled 2016-07-08 (×2): qty 1

## 2016-07-08 MED ORDER — CEFAZOLIN SODIUM-DEXTROSE 2-4 GM/100ML-% IV SOLN
2.0000 g | INTRAVENOUS | Status: AC
  Administered 2016-07-08: 2 g via INTRAVENOUS
  Filled 2016-07-08: qty 100

## 2016-07-08 MED ORDER — SENNA 8.6 MG PO TABS
1.0000 | ORAL_TABLET | Freq: Two times a day (BID) | ORAL | Status: DC
  Administered 2016-07-08 – 2016-07-10 (×4): 8.6 mg via ORAL
  Filled 2016-07-08 (×4): qty 1

## 2016-07-08 MED ORDER — OXYCODONE HCL 5 MG PO TABS
10.0000 mg | ORAL_TABLET | ORAL | Status: DC | PRN
  Administered 2016-07-08 – 2016-07-10 (×7): 10 mg via ORAL
  Filled 2016-07-08 (×7): qty 2

## 2016-07-08 MED ORDER — HYDROMORPHONE HCL 1 MG/ML IJ SOLN
0.5000 mg | INTRAMUSCULAR | Status: DC | PRN
  Administered 2016-07-08 – 2016-07-10 (×3): 1 mg via INTRAVENOUS
  Filled 2016-07-08 (×3): qty 1

## 2016-07-08 MED ORDER — CEFAZOLIN SODIUM-DEXTROSE 2-4 GM/100ML-% IV SOLN
INTRAVENOUS | Status: AC
  Filled 2016-07-08: qty 100

## 2016-07-08 MED ORDER — FENTANYL CITRATE (PF) 250 MCG/5ML IJ SOLN
INTRAMUSCULAR | Status: AC
  Filled 2016-07-08: qty 5

## 2016-07-08 MED ORDER — SODIUM CHLORIDE 0.9 % IJ SOLN
INTRAMUSCULAR | Status: AC
  Filled 2016-07-08: qty 50

## 2016-07-08 MED ORDER — ONDANSETRON HCL 4 MG/2ML IJ SOLN: 4.0000 mg | INTRAMUSCULAR | Status: DC | PRN

## 2016-07-08 MED ORDER — SUCCINYLCHOLINE CHLORIDE 200 MG/10ML IV SOSY
PREFILLED_SYRINGE | INTRAVENOUS | Status: AC
Start: 2016-07-08 — End: 2016-07-08
  Filled 2016-07-08: qty 10

## 2016-07-08 MED ORDER — SUGAMMADEX SODIUM 500 MG/5ML IV SOLN
INTRAVENOUS | Status: AC
  Filled 2016-07-08: qty 5

## 2016-07-08 SURGICAL SUPPLY — 75 items
BAG URO CATCHER STRL LF (MISCELLANEOUS) ×3 IMPLANT
BASKET LASER NITINOL 1.9FR (BASKET) IMPLANT
CATH INTERMIT  6FR 70CM (CATHETERS) ×3 IMPLANT
CHLORAPREP W/TINT 26ML (MISCELLANEOUS) ×3 IMPLANT
CLIP LIGATING HEM O LOK PURPLE (MISCELLANEOUS) ×3 IMPLANT
CLIP LIGATING HEMO O LOK GREEN (MISCELLANEOUS) ×3 IMPLANT
CLOTH BEACON ORANGE TIMEOUT ST (SAFETY) ×3 IMPLANT
COVER TIP SHEARS 8 DVNC (MISCELLANEOUS) ×1 IMPLANT
COVER TIP SHEARS 8MM DA VINCI (MISCELLANEOUS) ×2
DECANTER SPIKE VIAL GLASS SM (MISCELLANEOUS) ×3 IMPLANT
DERMABOND ADVANCED (GAUZE/BANDAGES/DRESSINGS) ×2
DERMABOND ADVANCED .7 DNX12 (GAUZE/BANDAGES/DRESSINGS) ×1 IMPLANT
DRAIN CHANNEL 15F RND FF 3/16 (WOUND CARE) ×3 IMPLANT
DRAPE ARM DVNC X/XI (DISPOSABLE) ×4 IMPLANT
DRAPE COLUMN DVNC XI (DISPOSABLE) ×1 IMPLANT
DRAPE DA VINCI XI ARM (DISPOSABLE) ×8
DRAPE DA VINCI XI COLUMN (DISPOSABLE) ×2
DRAPE INCISE IOBAN 66X45 STRL (DRAPES) ×3 IMPLANT
DRAPE LAPAROSCOPIC ABDOMINAL (DRAPES) ×3 IMPLANT
DRAPE SHEET LG 3/4 BI-LAMINATE (DRAPES) ×3 IMPLANT
ELECT PENCIL ROCKER SW 15FT (MISCELLANEOUS) ×3 IMPLANT
ELECT REM PT RETURN 9FT ADLT (ELECTROSURGICAL) ×3
ELECTRODE REM PT RTRN 9FT ADLT (ELECTROSURGICAL) ×1 IMPLANT
EVACUATOR SILICONE 100CC (DRAIN) ×3 IMPLANT
FIBER LASER FLEXIVA 1000 (UROLOGICAL SUPPLIES) IMPLANT
FIBER LASER FLEXIVA 365 (UROLOGICAL SUPPLIES) IMPLANT
FIBER LASER FLEXIVA 550 (UROLOGICAL SUPPLIES) IMPLANT
FIBER LASER TRAC TIP (UROLOGICAL SUPPLIES) IMPLANT
GLOVE BIOGEL M 6.5 STRL (GLOVE) ×6 IMPLANT
GLOVE BIOGEL M STRL SZ7.5 (GLOVE) ×15 IMPLANT
GOWN STRL REUS W/ TWL LRG LVL3 (GOWN DISPOSABLE) ×5 IMPLANT
GOWN STRL REUS W/TWL LRG LVL3 (GOWN DISPOSABLE) ×25 IMPLANT
GUIDEWIRE ANG ZIPWIRE 038X150 (WIRE) IMPLANT
GUIDEWIRE STR DUAL SENSOR (WIRE) ×3 IMPLANT
IRRIG SUCT STRYKERFLOW 2 WTIP (MISCELLANEOUS) ×3
IRRIGATION SUCT STRKRFLW 2 WTP (MISCELLANEOUS) ×1 IMPLANT
IV NS 1000ML (IV SOLUTION) ×2
IV NS 1000ML BAXH (IV SOLUTION) ×1 IMPLANT
KIT BASIN OR (CUSTOM PROCEDURE TRAY) ×3 IMPLANT
LOOP VESSEL MAXI BLUE (MISCELLANEOUS) ×3 IMPLANT
MANIFOLD NEPTUNE II (INSTRUMENTS) ×3 IMPLANT
NEEDLE INSUFFLATION 14GA 120MM (NEEDLE) ×3 IMPLANT
NS IRRIG 1000ML POUR BTL (IV SOLUTION) ×3 IMPLANT
PACK CYSTO (CUSTOM PROCEDURE TRAY) ×3 IMPLANT
PORT ACCESS TROCAR AIRSEAL 12 (TROCAR) ×1 IMPLANT
PORT ACCESS TROCAR AIRSEAL 5M (TROCAR) ×2
POSITIONER SURGICAL ARM (MISCELLANEOUS) ×6 IMPLANT
SEAL CANN UNIV 5-8 DVNC XI (MISCELLANEOUS) ×4 IMPLANT
SEAL XI 5MM-8MM UNIVERSAL (MISCELLANEOUS) ×8
SET TRI-LUMEN FLTR TB AIRSEAL (TUBING) ×3 IMPLANT
SOLUTION ELECTROLUBE (MISCELLANEOUS) ×3 IMPLANT
SPONGE LAP 18X18 X RAY DECT (DISPOSABLE) IMPLANT
SPONGE LAP 4X18 X RAY DECT (DISPOSABLE) ×3 IMPLANT
STENT URET 6FRX26 CONTOUR (STENTS) ×3 IMPLANT
SUT ETHILON 3 0 PS 1 (SUTURE) ×3 IMPLANT
SUT MNCRL 3 0 VIOLET RB1 (SUTURE) ×4 IMPLANT
SUT MNCRL AB 4-0 PS2 18 (SUTURE) ×6 IMPLANT
SUT MONOCRYL 3 0 RB1 (SUTURE) ×8
SUT VIC AB 0 CT1 27 (SUTURE) ×2
SUT VIC AB 0 CT1 27XBRD ANTBC (SUTURE) ×1 IMPLANT
SUT VIC AB 0 UR5 27 (SUTURE) ×3 IMPLANT
SUT VICRYL 0 UR6 27IN ABS (SUTURE) ×6 IMPLANT
SUT VLOC BARB 180 ABS3/0GR12 (SUTURE) ×6
SUTURE VLOC BRB 180 ABS3/0GR12 (SUTURE) ×2 IMPLANT
SYR CONTROL 10ML LL (SYRINGE) IMPLANT
TOWEL OR 17X26 10 PK STRL BLUE (TOWEL DISPOSABLE) ×3 IMPLANT
TOWEL OR NON WOVEN STRL DISP B (DISPOSABLE) ×3 IMPLANT
TRAY FOLEY W/METER SILVER 16FR (SET/KITS/TRAYS/PACK) ×3 IMPLANT
TRAY LAPAROSCOPIC (CUSTOM PROCEDURE TRAY) ×3 IMPLANT
TROCAR BLADELESS OPT 5 100 (ENDOMECHANICALS) IMPLANT
TROCAR XCEL 12X100 BLDLESS (ENDOMECHANICALS) ×3 IMPLANT
TUBE FEEDING 8FR 16IN STR KANG (MISCELLANEOUS) IMPLANT
TUBING CONNECTING 10 (TUBING) ×2 IMPLANT
TUBING CONNECTING 10' (TUBING) ×1
WATER STERILE IRR 1500ML POUR (IV SOLUTION) ×3 IMPLANT

## 2016-07-08 NOTE — H&P (Signed)
[name] [name] is an 36 y.o. male.    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 Renogram with Left 48 / Right 52 split function (normal) and delayed left drainage c/w partial UPJO. Left Renovascular anatomy HIGH COMPLEX as Follows:   1 artery / 1 vein to upper pole in anatomic postion  1 artery just below IMA that crosses over anterior kidney to lower-mid area  2 small isthmus arteries and 1 short isthmus vein that supply isthmus and lower area and enter posteriorly  Additional small pair of vessels with artery from left ext iliac / vein from IVC bifurcation that appears to travel over dilate pelvis and ? communicates with gonadal   Unclear but high probability of "crossing vessel" physiology with ureter from one of the anterior (non-isthmus) vessels.   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 Complex renovascular anatomy as per above. No h/o urolithiasis   3 - Chronic Left Inguinal-Scrotal Pain - Pt with comes and goes left groin pain x years. Some radiation to back, but not to testicle itself.  Left scrotal US unremarkable x several.   PMH sig for HLD, HTN ([facility] [address])   Today "[name] [name]" is seen to proceed with LEFT robotic pyeloplasty for large left hydro / partial obstruction that is probable source of chronic left inguinal pain.    Past Medical History:  Diagnosis Date  . Hypertension     Past Surgical History:  Procedure Laterality Date  . NO PAST SURGERIES      No family history on file. Social History:  reports that he has never smoked. He has never used smokeless tobacco. He reports that he drinks about 7.2 - 8.4 oz of alcohol per week . He reports that he does not use drugs.  Allergies:  Allergies  Allergen Reactions  . Doxycycline Rash    No prescriptions  prior to admission.    No results found for this or any previous visit (from the past 48 hour(s)). No results found.  Review of Systems  Constitutional: Negative.   HENT: Negative.   Eyes: Negative.   Respiratory: Negative.   Cardiovascular: Negative.   Gastrointestinal: Negative.   Genitourinary: Positive for flank pain.  Skin: Negative.   Neurological: Negative.   Endo/Heme/Allergies: Negative.   Psychiatric/Behavioral: Negative.     There were no vitals taken for this visit. Physical Exam  Constitutional: He is oriented to person, place, and time. He appears well-developed.  HENT:  Head: Normocephalic.  Eyes: Pupils are equal, round, and reactive to light.  Neck: Normal range of motion.  Cardiovascular: Normal rate.   Respiratory: Effort normal.  GI: Soft.  Genitourinary:  Genitourinary Comments: No CVAT at present.   Musculoskeletal: Normal range of motion.  Neurological: He is alert and oriented to person, place, and time.  Skin: Skin is warm.  Psychiatric: He has a normal mood and affect.     Assessment/Plan  Proceed with cysto / retrograde / stent immeidately piror to repositioning left side up lateral robotic approach. Goal would be dismembered substraction technique to reduce amount of dilate renal pelvis as well.   RIsks, benefits, alternatives, expected peri-op course discussed previously and reiterated today.  Hopefully this would help his left ingiunal pain which very well may be from left sided partial obstruction.    Annsley Akkerman,  Arjay Jaskiewicz, MD 07/08/2016, 6:52 AM

## 2016-07-08 NOTE — Progress Notes (Signed)
Reddened area noted to Lt chest upon arrival to pacu. tegaderm in place.   Will monitor.

## 2016-07-08 NOTE — Anesthesia Preprocedure Evaluation (Signed)
Anesthesia Evaluation  Patient identified by MRN, date of birth, ID band Patient awake    Reviewed: Allergy & Precautions, NPO status , Patient's Chart, lab work & pertinent test results  Airway Mallampati: III       Dental no notable dental hx. (+) Teeth Intact   Pulmonary neg pulmonary ROS,    Pulmonary exam normal breath sounds clear to auscultation       Cardiovascular hypertension, Pt. on medications Normal cardiovascular exam Rhythm:Regular Rate:Normal     Neuro/Psych negative neurological ROS  negative psych ROS   GI/Hepatic negative GI ROS, Neg liver ROS,   Endo/Other  Hyperlipidemia Obesity  Renal/GU Renal diseaseHorseshoe Kidney Left UPJ obstruction  negative genitourinary   Musculoskeletal negative musculoskeletal ROS (+)   Abdominal (+) + obese,   Peds  Hematology negative hematology ROS (+)   Anesthesia Other Findings   Reproductive/Obstetrics                             Lab Results  Component Value Date   WBC 6.1 06/30/2016   HGB 15.2 06/30/2016   HCT 45.7 06/30/2016   MCV 87.0 06/30/2016   PLT 212 06/30/2016     Chemistry      Component Value Date/Time   NA 141 06/30/2016 0948   K 4.2 06/30/2016 0948   CL 105 06/30/2016 0948   CO2 27 06/30/2016 0948   BUN 14 06/30/2016 0948   CREATININE 0.75 06/30/2016 0948   CREATININE 0.75 03/28/2016 1229      Component Value Date/Time   CALCIUM 9.3 06/30/2016 0948   ALKPHOS 61 03/28/2016 1229   AST 26 03/28/2016 1229   ALT 35 03/28/2016 1229   BILITOT 0.5 03/28/2016 1229      Anesthesia Physical Anesthesia Plan  ASA: II  Anesthesia Plan: General   Post-op Pain Management:    Induction: Intravenous  Airway Management Planned: Oral ETT  Additional Equipment:   Intra-op Plan:   Post-operative Plan: Extubation in OR  Informed Consent: I have reviewed the patients History and Physical, chart, labs and  discussed the procedure including the risks, benefits and alternatives for the proposed anesthesia with the patient or authorized representative who has indicated his/her understanding and acceptance.   Dental advisory given  Plan Discussed with: CRNA, Anesthesiologist and Surgeon  Anesthesia Plan Comments:         Anesthesia Quick Evaluation

## 2016-07-08 NOTE — Anesthesia Procedure Notes (Signed)
Procedure Name: Intubation Date/Time: 07/08/2016 11:59 AM Performed by: Noralyn Pick D Pre-anesthesia Checklist: Patient identified, Emergency Drugs available, Suction available and Patient being monitored Patient Re-evaluated:Patient Re-evaluated prior to inductionOxygen Delivery Method: Circle system utilized Preoxygenation: Pre-oxygenation with 100% oxygen Intubation Type: IV induction Ventilation: Mask ventilation without difficulty Laryngoscope Size: Mac, 4 and Glidescope Grade View: Grade I Tube type: Oral Tube size: 7.5 mm Number of attempts: 1 Airway Equipment and Method: Stylet Placement Confirmation: ETT inserted through vocal cords under direct vision,  positive ETCO2 and breath sounds checked- equal and bilateral Secured at: 23 cm Tube secured with: Tape Dental Injury: Teeth and Oropharynx as per pre-operative assessment

## 2016-07-08 NOTE — Brief Op Note (Signed)
07/08/2016  6:13 PM  PATIENT:  [name] [name]  36 y.o. male  PRE-OPERATIVE DIAGNOSIS:   LEFT URETEROPELVIC JUNCTION OBSTRUCTION IN HORSESHOE KIDNEY  POST-OPERATIVE DIAGNOSIS:   LEFT URETEROPELVIC JUNCTION OBSTRUCTION IN HORSESHOE KIDNEY  PROCEDURE:  Procedure(s): XI ROBOTIC ASSISTED PYELOPLASTY (Left) CYSTOSCOPY WITH RETROGRADE PYELOGRAM AND STENT PLACEMENT (Left)  SURGEON:  Surgeon(s) and Role:    * Sebastian Acheheodore Lashala Laser, MD - Primary  PHYSICIAN ASSISTANT:   ASSISTANTS: Lincoln Brighamroy Sukhu MD   ANESTHESIA:   local and general  EBL:  Total I/O In: 2500 [I.V.:2500] Out: 1062 [Urine:1000; Drains:12; Blood:50]  BLOOD ADMINISTERED:none  DRAINS: 1 - JP to bulb, 2 - Foley to gravity   LOCAL MEDICATIONS USED:  MARCAINE     SPECIMEN:  Source of Specimen:  redundant Left renal pelvis  DISPOSITION OF SPECIMEN:  PATHOLOGY  COUNTS:  YES  TOURNIQUET:  * No tourniquets in log *  DICTATION: .Other Dictation: Dictation Number [id number]  PLAN OF CARE: Admit to inpatient   PATIENT DISPOSITION:  PACU - hemodynamically stable.   Delay start of Pharmacological VTE agent (>24hrs) due to surgical blood loss or risk of bleeding: yes

## 2016-07-08 NOTE — Discharge Instructions (Signed)

## 2016-07-08 NOTE — Anesthesia Postprocedure Evaluation (Signed)
Anesthesia Post Note  Patient: [name] [name]  Procedure(s) Performed: Procedure(s) (LRB): XI ROBOTIC ASSISTED PYELOPLASTY (Left) CYSTOSCOPY WITH RETROGRADE PYELOGRAM AND STENT PLACEMENT (Left)  Patient location during evaluation: PACU Anesthesia Type: General Level of consciousness: awake and alert Pain management: pain level controlled Vital Signs Assessment: post-procedure vital signs reviewed and stable Respiratory status: spontaneous breathing, nonlabored ventilation, respiratory function stable and patient connected to nasal cannula oxygen Cardiovascular status: blood pressure returned to baseline and stable Postop Assessment: no signs of nausea or vomiting Anesthetic complications: no       Last Vitals:  Vitals:   07/08/16 0914 07/08/16 1619  BP: (!) 146/84 (!) 162/94  Pulse: 80 93  Resp: 16 (!) 21  Temp: 37.1 C 36.7 C    Last Pain:  Vitals:   07/08/16 0914  TempSrc: Oral                 Phillips Groutarignan, Margaurite Salido

## 2016-07-08 NOTE — Transfer of Care (Signed)
Immediate Anesthesia Transfer of Care Note  Patient: [name] [name]  Procedure(s) Performed: Procedure(s): XI ROBOTIC ASSISTED PYELOPLASTY (Left) CYSTOSCOPY WITH RETROGRADE PYELOGRAM AND STENT PLACEMENT (Left)  Patient Location: PACU  Anesthesia Type:General  Level of Consciousness: awake, alert  and oriented  Airway & Oxygen Therapy: Patient Spontanous Breathing and Patient connected to face mask oxygen  Post-op Assessment: Report given to RN and Post -op Vital signs reviewed and stable  Post vital signs: Reviewed and stable  Last Vitals:  Vitals:   07/08/16 0914 07/08/16 1619  BP: (!) 146/84 (!) 162/94  Pulse: 80 93  Resp: 16 (!) 21  Temp: 37.1 C 36.7 C    Last Pain:  Vitals:   07/08/16 0914  TempSrc: Oral      Patients Stated Pain Goal: 3 (07/08/16 1058)  Complications: No apparent anesthesia complications

## 2016-07-08 NOTE — Discharge Summary (Signed)
Date of admission: 07/08/2016  Date of discharge: 07/08/2016  Admission diagnosis: Left UPJ obstruction  Discharge diagnosis: Left UPJ obstruction  History and Physical: For full details, please see admission history and physical. Briefly, [name] [name] is a 36 y.o. 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  After discussing management/treatment options, he elected to proceed with surgical treatment.  Hospital Course: Erhardt Dada was taken to the operating room on 07/08/2016 and underwent a robotic assisted laparoscopic left dismembered pyeloplasty. He tolerated this procedure well and without complications. Postoperatively, he was able to be transferred to a regular hospital room following recovery from anesthesia.  He was able to begin ambulating the night of surgery. He remained hemodynamically stable overnight.  He had excellent urine output with appropriately minimal output from his pelvic drain and his pelvic drain was removed on POD #2.  He was transitioned to oral pain medication, tolerated a regular diet, and had met all discharge criteria and was able to be discharged home later on POD#2.  Laboratory values: No results for input(s): HGB, HCT in the last 72 hours.  Disposition: Home  Discharge instruction: He was instructed to be ambulatory but to refrain from heavy lifting, strenuous activity, or driving. He was instructed on urethral catheter care.  Discharge medications:   Allergies as of 07/08/2016      Reactions   Doxycycline Rash      Medication List    TAKE these medications   atorvastatin 10 MG tablet Commonly known as:  LIPITOR Take 1 tablet (10 mg total) by mouth daily.   cyclobenzaprine 10 MG tablet Commonly known as:  FLEXERIL Take 1 tablet (10 mg total) by mouth 3 (three) times daily as needed for muscle spasms.   lisinopril-hydrochlorothiazide 10-12.5 MG tablet Commonly known as:  PRINZIDE,ZESTORETIC Take 1 tablet by mouth daily.    oxyCODONE-acetaminophen 5-325 MG tablet Commonly known as:  ROXICET Take 1-2 tablets by mouth every 4 (four) hours as needed for severe pain.   tamsulosin 0.4 MG Caps capsule Commonly known as:  FLOMAX Take 1 capsule (0.4 mg total) by mouth daily.       Followup: He will followup for post -op check and then stent removal.

## 2016-07-09 LAB — BASIC METABOLIC PANEL
Anion gap: 9 (ref 5–15)
BUN: 12 mg/dL (ref 6–20)
CALCIUM: 9 mg/dL (ref 8.9–10.3)
CHLORIDE: 99 mmol/L — AB (ref 101–111)
CO2: 26 mmol/L (ref 22–32)
Creatinine, Ser: 0.91 mg/dL (ref 0.61–1.24)
GFR calc non Af Amer: >60 mL/min (ref >60)
GLUCOSE: 131 mg/dL — AB (ref 65–99)
Potassium: 4.3 mmol/L (ref 3.5–5.1)
Sodium: 134 mmol/L — ABNORMAL LOW (ref 135–145)

## 2016-07-09 LAB — HEMOGLOBIN AND HEMATOCRIT, BLOOD
HCT: 41.1 % (ref 39.0–52.0)
Hemoglobin: 13.5 g/dL (ref 13.0–17.0)

## 2016-07-09 NOTE — Op Note (Deleted)
  The note originally documented on this encounter has been moved the the encounter in which it belongs.  

## 2016-07-09 NOTE — Progress Notes (Signed)
No events overnight Tolerating clears No n/v +ambulation x1  Vitals:   07/08/16 1725 07/08/16 2205 07/09/16 0239 07/09/16 0420  BP: (!) 157/93 (!) 160/95 (!) 141/95 (!) 142/88  Pulse:  83 82 67  Resp: 18  15 14   Temp: 98.9 F (37.2 C) 98.1 F (36.7 C) 97.9 F (36.6 C) 98.2 F (36.8 C)  TempSrc:  Oral Oral Oral  SpO2: 100% 100% 100% 99%  Weight: 235 lb 0.2 oz (106.6 kg)     Height:       I/O last 3 completed shifts: In: 4916.3 [P.O.:360; I.V.:3931.3; Other:625] Out: 3227 [Urine:2950; Drains:227; Blood:50] Total I/O In: -  Out: 15 [Drains:15]   NAD Soft approp T mild D Inc c/d/i Foley clear jp ss  CBC    Component Value Date/Time   WBC 6.1 06/30/2016 0948   RBC 5.25 06/30/2016 0948   HGB 13.5 07/09/2016 0348   HCT 41.1 07/09/2016 0348   PLT 212 06/30/2016 0948   MCV 87.0 06/30/2016 0948   MCH 29.0 06/30/2016 0948   MCHC 33.3 06/30/2016 0948   RDW 13.6 06/30/2016 0948    BMP Latest Ref Rng & Units 07/09/2016 07/08/2016 06/30/2016  Glucose 65 - 99 mg/dL 161(W131(H) 960(A128(H) 540(J103(H)  BUN 6 - 20 mg/dL 12 12 14   Creatinine 0.61 - 1.24 mg/dL 8.110.91 9.140.99 7.820.75  Sodium 135 - 145 mmol/L 134(L) 137 141  Potassium 3.5 - 5.1 mmol/L 4.3 4.1 4.2  Chloride 101 - 111 mmol/L 99(L) 101 105  CO2 22 - 32 mmol/L 26 27 27   Calcium 8.9 - 10.3 mg/dL 9.0 9.1 9.3   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 Progressing as expected -d/c foley -monitor JP output -> relatively high output overnight likely 2/2 residual fluid in abdomen from surgery -adv diet -ambulate -likely home in AM

## 2016-07-09 NOTE — Op Note (Signed)
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  PROCEDURE: 1. Cystoscopy with left retrograde pyelogram interpretation. 2. Insertion of left ureteral stent, 6 x 26 Contour, no tether. 3. Robotic-assisted laparoscopic left dismembered pyeloplasty.  ESTIMATED BLOOD LOSS:  Less than 100 mL.  COMPLICATION:  None.  SPECIMEN:  Redundant left renal pelvis for permanent pathology.  FINDINGS: 1. Massive left hydronephrosis without ureteral nephrosis with     retrograde pyelogram and very lateral insertion of ureteral     orifice. 2. Cause of UPJ obstruction a combination of small crossing vessel and     abnormally high lateral insertion. 3. New UPJ in maximally inferomedial location on the left following     pyeloplasty with substraction of a large amount of left redundant     renal pelvis. 4. Successful placement of left ureteral stent, proximal in renal     pelvis and distal in urinary bladder.  INDICATION:  [name] [name] is a very pleasant 36 year old gentleman with a progressive history of left inguinal and flank pain who was found on workup of this to have massive left hydronephrosis and a horseshoe kidney.  He underwent renogram which revealed likely partial UPJ obstruction on the left, but preserved relative renal function.  Axial imaging revealed horseshoe kidney with incredibly complex left renovascular anatomy with approximately 5 left renal arteries and 3 left renal veins.  Options were discussed for management including chronic stenting versus surveillance alone versus definitive management with pyeloplasty and he wished to proceed with the latter.  Informed consent was  obtained and placed in the medical record.  PROCEDURE IN DETAIL:  The patient being [name] [name] was verified. Procedure being left robotic pyeloplasty and cystoscopy with stent placement and retrograde was confirmed.  Procedure was carried out. Time-out was performed.  Intravenous antibiotics were administered. General endotracheal anesthesia introduced.  The patient was initially placed into a low lithotomy position.  Sterile field was created by prepping and draping the patient's penis, perineum, proximal thighs using iodine.  Next, cystourethroscopy was performed using rigid cystoscope with offset lens.  Inspection of the anterior and posterior urethra were unremarkable.  Inspection of the urinary bladder revealed no diverticula, calcifications, papular lesions.  The left ureteral orifice was cannulated with a 6-French end-hole catheter and left retrograde pyelogram was obtained.  Left retrograde pyelogram demonstrates single left ureter with single- system left kidney.  There was massive hydronephrosis without ureteral nephrosis.  There was very abnormal superior lateral insertion of the left UPJ into the horseshoe kidney.  A 0.038 Zip wire was advanced to the level of the renal pelvis over which a new 6 x 26 Contour-type stent was placed.  Good proximal distal deployment were noted.  Foley catheter was placed per urethra to straight drain.  The patient was then repositioned into a left side up  full flank position, applying 15 degrees of stable flexion, superior arm elevator, axillary roll, sequential compression devices, bottom leg bent, top leg straight.  All bony prominences were padded.  He was further fashioned to the operating table using 3-inch tape over foam padding and a beanbag was also deployed and a new sterile field was created by first clipper shaving and prepping and draping the patient's entire left flank and abdomen using chlorhexidine gluconate.  Next, a  high-flow low-pressure pneumoperitoneum was obtained using Veress technique in the left lower quadrant having passed the aspiration and drop test and an 8-mm robotic camera port was placed in position approximately 4 fingerbreadths superior lateral to the umbilicus.  Laparoscopic examination of the peritoneal cavity revealed no significant adhesions and no visceral injury.  Additional ports were then placed as follows.  Left subcostal 8- mm robotic port, left far lateral 8-mm robotic port approximately 4 fingerbreadths superior medial to the anterior superior iliac spine.  A left paramedian inferior robotic port approximately 3 fingerbreadths above the pubic ramus and two 12-mm assistant ports in the midline, one in the infraumbilical crease and another approximately 2 fingerbreadths above the level of the camera port.  Robot was docked and passed through the electronic checks.  Initial attention was directed at development of the retroperitoneum.  Incision was made lateral to the descending colon. The area of the splenic flexure towards the area of the internal ring and the colon was carefully swept medially.  The retroperitoneum was very abnormal as anticipated.  The descending colonic mesentery was quite adherent to the anterior surface of Gerota's fascia and there were numerous abnormal vessels even within the mesentery.  Exquisite care was taken to develop the plane anterior to Gerota's taking extreme care not to devascularize the colon.  The left iliac vessels were positively identified, as was the gonadal vein.  Given the limitations of the horseshoe kidney with inability to place the kidney on lateral stretch, the area of the UPJ was identified by dissecting directly onto the left renal parenchyma at its anterior surface and following this medially towards the area of the renal pelvis.  Renal pelvis was identified and massively enlarged as anticipated and this was freed  up circumferentially from the upper pole towards the area of the isthmus on the left side.  This did allow identification of the UPJ area given the stent in place and also anticipating a lateral insertion.  The UPJ was somewhat superior and quite lateral as anticipated.  There was a conglomerate of very small vessels coursing anteriorly to this causing somewhat of a crossing vessel physiology in addition to the abnormally superior and lateral insertion.  These vessels were quite small and were not any of the prospectively identified aforementioned large 5 arterial branches or 3 venous branches on the left, therefore these vessels were controlled using bipolar energy and sharply divided.  This did not completely meet the goals of the procedure today as the goal was to minimize the amount of hydronephrosis and ensure optimal inferior medial insertion of the UPJ.  As such, decision made to proceed with dismembered pyeloplasty and the ureter was carefully coldly transected taking exquisite care not to transect the in-situ stent and a substraction tailoring was performed of the very large redundant renal pelvis excising approximately two-thirds of the amount of redundant tissue in an orientation such that the prior UPJ was removed and to allow pyeloplasty with placement of the new UPJ in the most inferior medial location.  The  ureter and renal pelvis were rejoined first spatulating the ureter for distance approximately 1 cm, placing a heel stitch of 3-0 Monocryl and then two separate running suture lines with 3- 0 Monocryl from the heel outwards which resulted in excellent mucosa-to- mucosa anastomosis.  The very large medial free end of the tailored resection of the renal pelvis was then closed using two separate running lines of 3-0 V-Loc, one superiorly towards the mid pole and another inferiorly towards the mid pole which were then joined in the center. This resulted in excellent mucosal  reapproximation of the area of the renal pelvis.  Following these maneuvers, excellent hemostasis.  All sponge and needle counts were correct.  The area of the UPJ appeared to be suitably vascular and viable and again was now in a much more inferior medial orientation compared to prior with resolution of all obstructing-appearing crossing vessels.  The retroperitoneum was redeveloped by placing the descending colon back over Gerota's fascia. A closed suction drain was brought through the previous lateral most robotic port site near the peritoneal cavity.  Robot was then undocked. The redundant renal pelvis specimen was placed into an EndoCatch bag which was retrieved via the inferior most assistant port site and both assistant port sites were closed at the level of the fascia using a Carter-Thomason suture passer under laparoscopic vision.  All incision sites were infiltrated with dilute lyophilized Marcaine and then closed at the level of the skin using subcuticular Monocryl followed by Dermabond and the procedure was terminated.  The patient tolerated the procedure well and no immediate periprocedural complications.  The patient was taken to the postanesthesia care in stable condition.  The patient's family and the patient were updated as well postoperatively with a Spanish interpreter.    ______________________________ Sebastian Ache, MD   ______________________________ Sebastian Ache, MD    TM/MEDQ  D:  07/08/2016  T:  07/09/2016  Job:  [id number]

## 2016-07-10 MED ORDER — ACETAMINOPHEN 325 MG PO TABS
650.0000 mg | ORAL_TABLET | Freq: Four times a day (QID) | ORAL | Status: DC | PRN
  Administered 2016-07-10: 650 mg via ORAL
  Filled 2016-07-10: qty 2

## 2016-07-10 NOTE — Progress Notes (Signed)
PT reports that his pain remains inside his L scrotum as " it has all along"  Characterized as constant throbbing from a 5/10 to a 9/10.   No change in visual appearance of scrotum.  Has had a low grade temp tonight.  Is ambulating and voiding well.  JP fluid is s/s and put out 10cc's from 1900 to 2300.

## 2016-07-11 ENCOUNTER — Encounter (HOSPITAL_COMMUNITY): Payer: Self-pay | Admitting: Urology

## 2016-07-15 ENCOUNTER — Emergency Department (HOSPITAL_COMMUNITY): Payer: Self-pay

## 2016-07-15 ENCOUNTER — Encounter (HOSPITAL_COMMUNITY): Payer: Self-pay | Admitting: Emergency Medicine

## 2016-07-15 ENCOUNTER — Inpatient Hospital Stay (HOSPITAL_COMMUNITY)
Admission: EM | Admit: 2016-07-15 | Discharge: 2016-07-23 | DRG: 872 | Disposition: A | Discharge status: Home / Self Care | Payer: Self-pay | Attending: Internal Medicine | Admitting: Internal Medicine

## 2016-07-15 DIAGNOSIS — R188 Other ascites: Secondary | ICD-10-CM | POA: Diagnosis present

## 2016-07-15 DIAGNOSIS — N179 Acute kidney failure, unspecified: Secondary | ICD-10-CM | POA: Diagnosis present

## 2016-07-15 DIAGNOSIS — E871 Hypo-osmolality and hyponatremia: Secondary | ICD-10-CM | POA: Diagnosis present

## 2016-07-15 DIAGNOSIS — Z960 Presence of urogenital implants: Secondary | ICD-10-CM | POA: Diagnosis present

## 2016-07-15 DIAGNOSIS — E860 Dehydration: Secondary | ICD-10-CM | POA: Diagnosis present

## 2016-07-15 DIAGNOSIS — T8189XA Other complications of procedures, not elsewhere classified, initial encounter: Secondary | ICD-10-CM | POA: Diagnosis present

## 2016-07-15 DIAGNOSIS — I10 Essential (primary) hypertension: Secondary | ICD-10-CM | POA: Diagnosis present

## 2016-07-15 DIAGNOSIS — R58 Hemorrhage, not elsewhere classified: Secondary | ICD-10-CM

## 2016-07-15 DIAGNOSIS — N135 Crossing vessel and stricture of ureter without hydronephrosis: Secondary | ICD-10-CM | POA: Diagnosis present

## 2016-07-15 DIAGNOSIS — N390 Urinary tract infection, site not specified: Secondary | ICD-10-CM | POA: Diagnosis present

## 2016-07-15 DIAGNOSIS — Y838 Other surgical procedures as the cause of abnormal reaction of the patient, or of later complication, without mention of misadventure at the time of the procedure: Secondary | ICD-10-CM | POA: Diagnosis present

## 2016-07-15 DIAGNOSIS — R32 Unspecified urinary incontinence: Secondary | ICD-10-CM

## 2016-07-15 DIAGNOSIS — R319 Hematuria, unspecified: Secondary | ICD-10-CM

## 2016-07-15 DIAGNOSIS — D649 Anemia, unspecified: Secondary | ICD-10-CM | POA: Diagnosis present

## 2016-07-15 DIAGNOSIS — D62 Acute posthemorrhagic anemia: Secondary | ICD-10-CM | POA: Diagnosis present

## 2016-07-15 DIAGNOSIS — N2889 Other specified disorders of kidney and ureter: Secondary | ICD-10-CM

## 2016-07-15 DIAGNOSIS — R390 Extravasation of urine: Secondary | ICD-10-CM | POA: Diagnosis present

## 2016-07-15 DIAGNOSIS — N99820 Postprocedural hemorrhage and hematoma of a genitourinary system organ or structure following a genitourinary system procedure: Secondary | ICD-10-CM | POA: Diagnosis present

## 2016-07-15 DIAGNOSIS — N130 Hydronephrosis with ureteropelvic junction obstruction: Secondary | ICD-10-CM | POA: Diagnosis present

## 2016-07-15 DIAGNOSIS — E785 Hyperlipidemia, unspecified: Secondary | ICD-10-CM | POA: Diagnosis present

## 2016-07-15 DIAGNOSIS — Z881 Allergy status to other antibiotic agents status: Secondary | ICD-10-CM

## 2016-07-15 DIAGNOSIS — Q631 Lobulated, fused and horseshoe kidney: Secondary | ICD-10-CM

## 2016-07-15 DIAGNOSIS — A419 Sepsis, unspecified organism: Principal | ICD-10-CM | POA: Diagnosis present

## 2016-07-15 LAB — BASIC METABOLIC PANEL
ANION GAP: 11 (ref 5–15)
BUN: 26 mg/dL — AB (ref 6–20)
CALCIUM: 8.7 mg/dL — AB (ref 8.9–10.3)
CO2: 26 mmol/L (ref 22–32)
Chloride: 88 mmol/L — ABNORMAL LOW (ref 101–111)
Creatinine, Ser: 1.38 mg/dL — ABNORMAL HIGH (ref 0.61–1.24)
GFR calc Af Amer: >60 mL/min (ref >60)
GLUCOSE: 149 mg/dL — AB (ref 65–99)
POTASSIUM: 4.6 mmol/L (ref 3.5–5.1)
SODIUM: 125 mmol/L — AB (ref 135–145)

## 2016-07-15 LAB — CBC
HEMATOCRIT: 31 % — AB (ref 39.0–52.0)
Hemoglobin: 10.8 g/dL — ABNORMAL LOW (ref 13.0–17.0)
MCH: 29 pg (ref 26.0–34.0)
MCHC: 34.8 g/dL (ref 30.0–36.0)
MCV: 83.1 fL (ref 78.0–100.0)
Platelets: 396 10*3/uL (ref 150–400)
RBC: 3.73 MIL/uL — ABNORMAL LOW (ref 4.22–5.81)
RDW: 13.8 % (ref 11.5–15.5)
WBC: 25.2 10*3/uL — AB (ref 4.0–10.5)

## 2016-07-15 LAB — URINALYSIS, ROUTINE W REFLEX MICROSCOPIC
GLUCOSE, UA: 100 mg/dL — AB
KETONES UR: 15 mg/dL — AB
Nitrite: POSITIVE — AB
Specific Gravity, Urine: 1.025 (ref 1.005–1.030)
Squamous Epithelial / LPF: NONE SEEN
pH: 6.5 (ref 5.0–8.0)

## 2016-07-15 LAB — DIFFERENTIAL
BAND NEUTROPHILS: 0 %
BASOS PCT: 0 %
Basophils Absolute: 0 10*3/uL (ref 0.0–0.1)
Blasts: 0 %
EOS ABS: 0 10*3/uL (ref 0.0–0.7)
EOS PCT: 0 %
LYMPHS PCT: 4 %
Lymphs Abs: 1 10*3/uL (ref 0.7–4.0)
MONOS PCT: 5 %
Metamyelocytes Relative: 0 %
Monocytes Absolute: 1.3 10*3/uL — ABNORMAL HIGH (ref 0.1–1.0)
Myelocytes: 0 %
NEUTROS ABS: 22.9 10*3/uL — AB (ref 1.7–7.7)
NEUTROS PCT: 91 %
NRBC: 0 /100{WBCs}
OTHER: 0 %
PROMYELOCYTES ABS: 0 %

## 2016-07-15 LAB — I-STAT CG4 LACTIC ACID, ED: Lactic Acid, Venous: 1.58 mmol/L (ref 0.5–1.9)

## 2016-07-15 MED ORDER — SODIUM CHLORIDE 0.9 % IV BOLUS (SEPSIS)
1000.0000 mL | Freq: Once | INTRAVENOUS | Status: AC
  Administered 2016-07-15: 1000 mL via INTRAVENOUS

## 2016-07-15 MED ORDER — IOPAMIDOL (ISOVUE-300) INJECTION 61%
INTRAVENOUS | Status: AC
  Filled 2016-07-15: qty 100

## 2016-07-15 MED ORDER — IOPAMIDOL (ISOVUE-300) INJECTION 61%
100.0000 mL | Freq: Once | INTRAVENOUS | Status: AC | PRN
Start: 2016-07-15 — End: 2016-07-15
  Administered 2016-07-15: 100 mL via INTRAVENOUS

## 2016-07-15 MED ORDER — FENTANYL CITRATE (PF) 100 MCG/2ML IJ SOLN
50.0000 ug | INTRAMUSCULAR | Status: DC | PRN
  Administered 2016-07-15: 50 ug via INTRAVENOUS
  Filled 2016-07-15: qty 2

## 2016-07-15 MED ORDER — HYDROMORPHONE HCL 1 MG/ML IJ SOLN: 0.5000 mg | Freq: Once | INTRAMUSCULAR | Status: DC

## 2016-07-15 MED ORDER — SODIUM CHLORIDE 0.9 % IV SOLN
2000.0000 mg | Freq: Once | INTRAVENOUS | Status: AC
  Administered 2016-07-15: 2000 mg via INTRAVENOUS
  Filled 2016-07-15: qty 2000

## 2016-07-15 MED ORDER — HYDROMORPHONE HCL 1 MG/ML IJ SOLN
0.5000 mg | INTRAMUSCULAR | Status: DC | PRN
  Administered 2016-07-16 (×3): 1 mg via INTRAVENOUS
  Filled 2016-07-15 (×3): qty 1

## 2016-07-15 MED ORDER — DEXTROSE 5 % IV SOLN
1.0000 g | Freq: Once | INTRAVENOUS | Status: AC
  Administered 2016-07-15: 1 g via INTRAVENOUS
  Filled 2016-07-15: qty 1

## 2016-07-15 MED ORDER — FENTANYL CITRATE (PF) 100 MCG/2ML IJ SOLN
50.0000 ug | INTRAMUSCULAR | Status: DC | PRN
  Administered 2016-07-15: 50 ug via NASAL
  Filled 2016-07-15: qty 2

## 2016-07-15 NOTE — H&P (Signed)
[name] [name] WUJ:[id number] DOB: 1981/03/05 DOA: 07/15/2016     PCP: Jaclyn Shaggy, MD   Outpatient Specialists: [facility]   Patient coming from:  home Lives With family    Chief Complaint: Flank pain and hematuria  HPI: [name] [name] is a 36 y.o. 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 On 07/08/2016 patient underwent  a robotic assisted laparoscopic left dismembered pyeloplasty also had an insertion of left ureteral stent he was able to be discharged to home in good health but continued to have left scrotal pain at first mild 2 Days ago he developed pain that is constant and throbbing he also had associated low-grade temperature continued to have good urine output  But yesterday he developed severe left flank pain and hematuria. Pain is severe radiating to the testicles on the left side as well as left flank.  Regarding pertinent Chronic problems: Patient has known history of hypertension Controlled with lisinopril and hydrochlorothiazide  IN ER:  Temp (24hrs), Avg:99.5 F (37.5 C), Min:98.5 F (36.9 C), Max:100.5 F (38.1 C)  RR 18 sat 97% Hr 94 Bp 114/70 Lactic acid 1.58 Sodium 125 K4.6 chloride 88 bicarbonate 26 creatinine 1.38 which is up from baseline   AG 11 WBC 25.2 hemoglobin 10.8 UA positive for white blood cells and red blood cells with bacteria and nitrites   CT showing: Diffuse hemorrhage feeling markedly dilated left renal calyces and left renal pelvis origin free fluid in the left lower quadrant free fluid in the pelvis possible urine leak  Mildly decreased enhancement of the left side of caution kidney worrisome for mild  disruption of the blood supply   Following Medications were ordered in ER: Medications  fentaNYL (SUBLIMAZE) injection 50 mcg (50 mcg Intravenous Given 07/15/16 2116)  vancomycin (VANCOCIN) 2,000 mg in sodium chloride 0.9 % 500 mL IVPB (2,000 mg Intravenous New Bag/Given 07/15/16 2127)  iopamidol (ISOVUE-300) 61 % injection (not administered)  sodium chloride 0.9 % bolus 1,000 mL (0 mLs Intravenous Stopped 07/15/16 2122)  iopamidol (ISOVUE-300) 61 % injection 100 mL (100 mLs Intravenous Contrast Given 07/15/16 1945)  ceFEPIme (MAXIPIME) 1 g in dextrose 5 % 50 mL IVPB (0 g Intravenous Stopped 07/15/16 2123)     ER provider discussed case with: Urology With this point recommends admission to medicine to see patient in consult recommend IR consult for possible drain placement  Hospitalist was called for admission for Sepsis  Review of Systems:    Pertinent positives include: fatigue, abdominal pain, back pain, testicular pain change in color of urine,  flank pain.   Constitutional:  No weight loss, night sweats, Fevers, chills, weight loss  HEENT:  No headaches, Difficulty swallowing,Tooth/dental problems,Sore throat,  No sneezing, itching, ear ache, nasal congestion, post nasal drip,  Cardio-vascular:  No chest pain, Orthopnea, PND, anasarca, dizziness, palpitations.no Bilateral lower extremity swelling  GI:  No heartburn, indigestion,  nausea, vomiting, diarrhea, change in bowel habits, loss of appetite, melena, blood in stool, hematemesis Resp:  no shortness of breath at rest. No dyspnea on exertion, No excess mucus, no productive cough, No non-productive cough, No coughing up of blood.No change in color of mucus.No wheezing. Skin:  no  rash or lesions. No jaundice GU:  no dysuria,, no urgency or frequency. No straining to urinate.  No Musculoskeletal:  No joint pain or no joint swelling. No decreased range of motion.  Psych:  No change in mood or affect. No depression or anxiety.  No memory loss.  Neuro: no localizing neurological complaints, no tingling, no weakness, no double vision, no gait abnormality, no slurred speech, no confusion  As per HPI otherwise 10 point review of systems negative.   Past Medical History: Past Medical History:  Diagnosis Date  . Hypertension    Past Surgical History:  Procedure Laterality Date  . CYSTOSCOPY W/ RETROGRADES Left 07/08/2016   Procedure: CYSTOSCOPY WITH RETROGRADE PYELOGRAM AND STENT PLACEMENT;  Surgeon: Sebastian Ache, MD;  Location: [facility];  Service: Urology;  Laterality: Left;  . NO PAST SURGERIES    . ROBOT ASSISTED PYELOPLASTY Left 07/08/2016   Procedure: XI ROBOTIC ASSISTED PYELOPLASTY;  Surgeon: Sebastian Ache, MD;  Location: [facility];  Service: Urology;  Laterality: Left;     Social History:  Ambulatory  independently      reports that he has never smoked. He has never used smokeless tobacco. He reports that he drinks about 7.2 - 8.4 oz of alcohol per week . He reports that he does not use drugs.  Allergies:   Allergies  Allergen Reactions  . Doxycycline Rash       Family History:   Family History  Problem Relation Age of Onset  . Hypertension Father     Medications: Prior to Admission medications   Medication Sig Start Date End Date Taking? Authorizing Provider  atorvastatin (LIPITOR) 10 MG tablet Take 1 tablet (10 mg total) by mouth daily. 06/03/16  Yes Jaclyn Shaggy, MD  lisinopril-hydrochlorothiazide (PRINZIDE,ZESTORETIC) 10-12.5 MG tablet Take 1 tablet by mouth daily. 06/03/16  Yes Jaclyn Shaggy, MD  oxyCODONE-acetaminophen (ROXICET) 5-325 MG tablet Take 1-2 tablets by mouth every 4 (four) hours as needed for severe pain. 07/08/16  Yes Carolin Coy, MD  tamsulosin (FLOMAX) 0.4 MG CAPS capsule Take 1 capsule (0.4 mg total) by mouth daily. 07/08/16  Yes Carolin Coy, MD  cyclobenzaprine (FLEXERIL) 10 MG tablet Take 1 tablet (10 mg total) by mouth 3 (three) times daily as needed for muscle  spasms. Patient not taking: Reported on 07/15/2016 06/03/16   Jaclyn Shaggy, MD    Physical Exam: Patient Vitals for the past 24 hrs:  BP Temp Temp src Pulse Resp SpO2  07/15/16 2006 - 100.5 F (38.1 C) Rectal - - -  07/15/16 1900 114/70 - - 94 - 97 %  07/15/16 1830 94/62 - - 92 - 97 %  07/15/16 1812 105/67 - - 91 18 99 %  07/15/16 1724 111/63 98.5 F (36.9 C) Oral 98 18 100 %    1. General:  in No Acute distress 2. Psychological: Alert and   Oriented 3. Head/ENT:     Dry Mucous Membranes                          Head Non traumatic, neck supple                          Normal  Dentition 4. SKIN: decreased Skin turgor,  Skin clean Dry and intact no rash 5. Heart: Regular rate and rhythm no  Murmur, Rub or gallop 6. Lungs: Clear to auscultation bilaterally, no wheezes or crackles   7.  Abdomen: some generalized tenderness, distended, left flank tenderness 8. Lower extremities: no clubbing, cyanosis, or edema 9. Neurologically Grossly intact, moving all 4 extremities equally   10. MSK: Normal range of motion   body mass index is unknown because there is no height or weight on file.  Labs on Admission:   Labs on Admission: I have personally reviewed following labs and imaging studies  CBC:  Recent Labs Lab 07/09/16 0348 07/15/16 1818  WBC  --  25.2*  NEUTROABS  --  22.9*  HGB 13.5 10.8*  HCT 41.1 31.0*  MCV  --  83.1  PLT  --  396   Basic Metabolic Panel:  Recent Labs Lab 07/09/16 0348 07/15/16 1818  NA 134* 125*  K 4.3 4.6  CL 99* 88*  CO2 26 26  GLUCOSE 131* 149*  BUN 12 26*  CREATININE 0.91 1.38*  CALCIUM 9.0 8.7*   GFR: Estimated Creatinine Clearance: 90.8 mL/min (by C-G formula based on SCr of 1.38 mg/dL (H)). Liver Function Tests: No results for input(s): AST, ALT, ALKPHOS, BILITOT, PROT, ALBUMIN in the last 168 hours. No results for input(s): LIPASE, AMYLASE in the last 168 hours. No results for input(s): AMMONIA in the last 168 hours. Coagulation  Profile: No results for input(s): INR, PROTIME in the last 168 hours. Cardiac Enzymes: No results for input(s): CKTOTAL, CKMB, CKMBINDEX, TROPONINI in the last 168 hours. BNP (last 3 results) No results for input(s): PROBNP in the last 8760 hours. HbA1C: No results for input(s): HGBA1C in the last 72 hours. CBG: No results for input(s): GLUCAP in the last 168 hours. Lipid Profile: No results for input(s): CHOL, HDL, LDLCALC, TRIG, CHOLHDL, LDLDIRECT in the last 72 hours. Thyroid Function Tests: No results for input(s): TSH, T4TOTAL, FREET4, T3FREE, THYROIDAB in the last 72 hours. Anemia Panel: No results for input(s): VITAMINB12, FOLATE, FERRITIN, TIBC, IRON, RETICCTPCT in the last 72 hours. Urine analysis:    Component Value Date/Time   COLORURINE RED (A) 07/15/2016 1730   APPEARANCEUR TURBID (A) 07/15/2016 1730   LABSPEC 1.025 07/15/2016 1730   PHURINE 6.5 07/15/2016 1730   GLUCOSEU 100 (A) 07/15/2016 1730   HGBUR LARGE (A) 07/15/2016 1730   BILIRUBINUR LARGE (A) 07/15/2016 1730   KETONESUR 15 (A) 07/15/2016 1730   PROTEINUR >=300 (A) 07/15/2016 1730   NITRITE POSITIVE (A) 07/15/2016 1730   LEUKOCYTESUR MODERATE (A) 07/15/2016 1730   Sepsis Labs: @LABRCNTIP (procalcitonin:4,lacticidven:4) )No results found for this or any previous visit (from the past 240 hour(s)).     UA   evidence of UTI     Lab Results  Component Value Date   HGBA1C 5.4 03/29/2016    Estimated Creatinine Clearance: 90.8 mL/min (by C-G formula based on SCr of 1.38 mg/dL (H)).  BNP (last 3 results) No results for input(s): PROBNP in the last 8760 hours.   ECG REPORT Not ordered  There were no vitals filed for this visit.   Cultures: No results found for: SDES, SPECREQUEST, CULT, REPTSTATUS   Radiological Exams on Admission: Ct Abdomen Pelvis W Contrast  Result Date: 07/15/2016 CLINICAL DATA:  Acute onset of right flank pain and hematuria. Status post pyeloplasty. Initial encounter. EXAM:  CT ABDOMEN AND PELVIS WITH CONTRAST TECHNIQUE: Multidetector CT imaging of the abdomen and pelvis was performed using the standard protocol following bolus administration of intravenous contrast. CONTRAST:  100mL ISOVUE-300 IOPAMIDOL (ISOVUE-300) INJECTION 61% COMPARISON:  CT of the abdomen and pelvis from 05/06/2016 FINDINGS: Lower chest: The visualized lung bases are grossly  clear. The visualized portions of the mediastinum are unremarkable. Hepatobiliary: The liver is unremarkable in appearance. The gallbladder is unremarkable in appearance. The common bile duct remains normal in caliber. Pancreas: The pancreas is within normal limits. Spleen: The spleen is unremarkable in appearance. Adrenals/Urinary Tract: The adrenal glands are unremarkable in appearance. There is diffuse hemorrhage filling the markedly dilated left renal calyces and left renal pelvis, which may arise within a pocket of fluid directly medial to the proximal left ureter. Additional hemorrhage and free fluid are seen tracking inferiorly along the left lower quadrant, with free fluid seen in the pelvis, concerning for associated urine leak. Minimal underlying soft tissue air is thought to be postoperative in nature. The left ureteral stent is noted in expected position. There is mildly decreased enhancement of the left side of the horseshoe kidney, concerning for mild disruption of blood supply to the left kidney. The right side of the horseshoe kidney is unremarkable in appearance. Stomach/Bowel: The stomach is unremarkable in appearance. The small bowel is within normal limits. The appendix is not visualized; there is no evidence for appendicitis. The colon is unremarkable in appearance. Mild stranding is noted about the descending colon, reflecting the adjacent hemorrhage and fluid tracking inferiorly from the left side of the horseshoe kidney. Vascular/Lymphatic: The abdominal aorta is unremarkable in appearance. The inferior vena cava is  grossly unremarkable. No retroperitoneal lymphadenopathy is seen. No pelvic sidewall lymphadenopathy is identified. Reproductive: The bladder is mildly distended but otherwise unremarkable. The prostate remains normal in size. Other: Diffuse soft tissue air is noted along the left anterior abdominal wall, tracking into the left side of the scrotum. This appears to reflect the patient's recent laparoscopic surgery. Musculoskeletal: No acute osseous abnormalities are identified. The visualized musculature is unremarkable in appearance. IMPRESSION: 1. Diffuse hemorrhage filling the markedly dilated left renal calyces and left renal pelvis, which may arise within a pocket of fluid directly medial to the proximal left ureter. Additional hemorrhage and free fluid tracking inferiorly along the left lower quadrant, with free fluid in the pelvis, concerning for associated urine leak. Minimal underlying soft tissue air is thought to be postoperative in nature. 2. Mildly decreased enhancement of the left side of the horseshoe kidney, concerning for mild disruption of blood supply to the left kidney. Left ureteral stent noted in expected position. 3. Diffuse soft tissue air along the left anterior abdominal wall, tracking to the left side of the scrotum. This appears reflect the patient's recent laparoscopic surgery. These results were called by telephone at the time of interpretation on 07/15/2016 at 8:29 pm to Dr. Lyndal Pulley, who verbally acknowledged these results. Electronically Signed   By: Roanna Raider M.D.   On: 07/15/2016 20:30    Chart has been reviewed    Assessment/Plan   36 y.o. 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  Overall guarded prognosis monitor renal function. Monitor vital signs carefully and step down. Keep nothing by mouth post midnight for likely IR intervention in a.m.  [facility] Kitchen Sepsis (HCC) - Admit per Sepsis protocol likely source being   UTI vs intra-abdominal infection  - rehydrate with 50ml/kg  -  initiate broad spectrum antibiotics Vancomycin and Zosyn  -  obtain blood cultures  - Obtain serial lactic acid  - Obtain procalcitonin level  - Admit and monitor vital signs closely  -  PCCM has been consulted will e-link monitor  Sepsis - Repeat Assessment  Performed at:    22:30  Vitals     Blood pressure 128/77, pulse 106, temperature 100.5 F (38.1 C), temperature source Rectal, resp. rate 18, SpO2 96 %.  Heart:     Regular rate and rhythm  Lungs:    CTA  Capillary Refill:   <2 sec  Peripheral Pulse:   Radial pulse palpable  Skin:     Pale    . Hyperlipidemia stable hold PO medications for now . Hypertension -hold lisinopril due to AKI, hold HCTZ monitor for hypotension . UTI (urinary tract infection) await result of urine culture for now broad covererage given recent intervention . Hyponatremia the setting of dehydration continue to follow once patient be hydrated . Anemia suspect acute blood loss secondary to postoperative bleeding.order type and screen Continue to monitor CBC   Other plan as per orders.  DVT prophylaxis:  SCD   Code Status:  FULL CODE as per patient    Family Communication:   Family  at  Bedside  plan of care was discussed with   father,   Disposition Plan:    To home once workup is complete and patient is stable                     Consults called: Urology , PCCM, ER spoke to General surgery,discussed case with IR Dr watt who will see patient in consult in AM.    Admission status:   inpatient      Level of care      SDU        I have spent a total of 77 min on this admission extra time was spent to discuss case with IR and PCCM  [name] [name] 07/15/2016, 10:56 PM    [facility]  Pager [phone]   after 2 AM please page floor coverage PA If 7AM-7PM, please contact the day team taking care of the patient  [email or web address]  Password [email or web address]

## 2016-07-15 NOTE — ED Triage Notes (Signed)
Pt complaint of left flank pain and hematuria onset 1/10; pt xi robotic assisted pyeloplasty with stent placement and cystoscopy with retrograde pyelogram on 1/6.

## 2016-07-15 NOTE — ED Provider Notes (Signed)
[facility]-EMERGENCY DEPT Provider Note   CSN: [id number] Arrival date & time: 07/15/16  1717     History   Chief Complaint Chief Complaint  Patient presents with  . Hematuria    HPI Adelbert Gaspard is a 36 y.o. male.  The history is provided by the patient.  Abdominal Pain   This is a new problem. The current episode started 12 to 24 hours ago. The problem occurs constantly. The problem has been rapidly worsening. The pain is associated with a previous surgery. The pain is located in the LUQ and LLQ (left flank). The quality of the pain is aching, sharp and pressure-like. The pain is severe. Associated symptoms include anorexia, dysuria, frequency (with feeling of incomplete emptying), hematuria and myalgias. Pertinent negatives include hematochezia and melena. Nothing aggravates the symptoms. Nothing relieves the symptoms. Past medical history comments: horseshoe kidney.    Past Medical History:  Diagnosis Date  . Hypertension     Patient Active Problem List   Diagnosis Date Noted  . 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 Hypertension 03/28/2016  . Hyperlipidemia 03/28/2016    Past Surgical History:  Procedure Laterality Date  . CYSTOSCOPY W/ RETROGRADES Left 07/08/2016   Procedure: CYSTOSCOPY WITH RETROGRADE PYELOGRAM AND STENT PLACEMENT;  Surgeon: Sebastian Ache, MD;  Location: [facility];  Service: Urology;  Laterality: Left;  . NO PAST SURGERIES    . ROBOT ASSISTED PYELOPLASTY Left 07/08/2016   Procedure: XI ROBOTIC ASSISTED PYELOPLASTY;  Surgeon: Sebastian Ache, MD;  Location: [facility];  Service: Urology;  Laterality: Left;       Home Medications    Prior to Admission medications   Medication Sig Start Date End Date Taking? Authorizing Provider  atorvastatin (LIPITOR) 10 MG tablet Take 1 tablet (10 mg total) by mouth daily. 06/03/16  Yes Jaclyn Shaggy, MD  lisinopril-hydrochlorothiazide (PRINZIDE,ZESTORETIC) 10-12.5 MG tablet Take 1 tablet by mouth daily.  06/03/16  Yes Jaclyn Shaggy, MD  oxyCODONE-acetaminophen (ROXICET) 5-325 MG tablet Take 1-2 tablets by mouth every 4 (four) hours as needed for severe pain. 07/08/16  Yes Carolin Coy, MD  tamsulosin (FLOMAX) 0.4 MG CAPS capsule Take 1 capsule (0.4 mg total) by mouth daily. 07/08/16  Yes Carolin Coy, MD  cyclobenzaprine (FLEXERIL) 10 MG tablet Take 1 tablet (10 mg total) by mouth 3 (three) times daily as needed for muscle spasms. Patient not taking: Reported on 07/15/2016 06/03/16   Jaclyn Shaggy, MD    Family History No family history on file.  Social History Social History  Substance Use Topics  . Smoking status: Never Smoker  . Smokeless tobacco: Never Used  . Alcohol use 7.2 - 8.4 oz/week    12 - 14 Cans of beer per week     Allergies   Doxycycline   Review of Systems Review of Systems  Gastrointestinal: Positive for abdominal pain and anorexia. Negative for hematochezia and melena.  Genitourinary: Positive for dysuria, frequency (with feeling of incomplete emptying) and hematuria.  Musculoskeletal: Positive for myalgias.  All other systems reviewed and are negative.    Physical Exam Updated Vital Signs BP 114/70   Pulse 94   Temp 98.5 F (36.9 C) (Oral)   Resp 18   SpO2 97%   Physical Exam  Constitutional: He is oriented to person, place, and time. He appears well-developed and well-nourished. No distress.  HENT:  Head: Normocephalic and atraumatic.  Nose: Nose normal.  Eyes: Conjunctivae are normal.  Neck: Neck supple. No tracheal deviation present.  Cardiovascular: Normal  rate and regular rhythm.   Pulmonary/Chest: Effort normal. No respiratory distress.  Abdominal: Soft. He exhibits distension. There is tenderness in the left upper quadrant and left lower quadrant. There is rigidity and guarding.    Signs of localized peritonitis  Neurological: He is alert and oriented to person, place, and time.  Skin: Skin is warm and dry.  Psychiatric: He has a normal mood  and affect.     ED Treatments / Results  Labs (all labs ordered are listed, but only abnormal results are displayed) Labs Reviewed  URINALYSIS, ROUTINE W REFLEX MICROSCOPIC - Abnormal; Notable for the following:       Result Value   Color, Urine RED (*)    APPearance TURBID (*)    Glucose, UA 100 (*)    Hgb urine dipstick LARGE (*)    Bilirubin Urine LARGE (*)    Ketones, ur 15 (*)    Protein, ur >=300 (*)    Nitrite POSITIVE (*)    Leukocytes, UA MODERATE (*)    Bacteria, UA MANY (*)    All other components within normal limits  BASIC METABOLIC PANEL - Abnormal; Notable for the following:    Sodium 125 (*)    Chloride 88 (*)    Glucose, Bld 149 (*)    BUN 26 (*)    Creatinine, Ser 1.38 (*)    Calcium 8.7 (*)    All other components within normal limits  CBC - Abnormal; Notable for the following:    WBC 25.2 (*)    RBC 3.73 (*)    Hemoglobin 10.8 (*)    HCT 31.0 (*)    All other components within normal limits  DIFFERENTIAL - Abnormal; Notable for the following:    Neutro Abs 22.9 (*)    Monocytes Absolute 1.3 (*)    All other components within normal limits  URINE CULTURE  CULTURE, BLOOD (ROUTINE X 2)  CULTURE, BLOOD (ROUTINE X 2)  MRSA PCR SCREENING  DIC (DISSEMINATED INTRAVASCULAR COAGULATION) PANEL  LACTIC ACID, PLASMA  LACTIC ACID, PLASMA  PROCALCITONIN  PROTIME-INR  APTT  MAGNESIUM  PHOSPHORUS  TSH  COMPREHENSIVE METABOLIC PANEL  CBC  CBC  CBC  I-STAT CG4 LACTIC ACID, ED  TYPE AND SCREEN  ABO/RH    EKG  EKG Interpretation None       Radiology Ct Abdomen Pelvis W Contrast  Result Date: 07/15/2016 CLINICAL DATA:  Acute onset of right flank pain and hematuria. Status post pyeloplasty. Initial encounter. EXAM: CT ABDOMEN AND PELVIS WITH CONTRAST TECHNIQUE: Multidetector CT imaging of the abdomen and pelvis was performed using the standard protocol following bolus administration of intravenous contrast. CONTRAST:  ISOVUE-300 IOPAMIDOL  (ISOVUE-300) INJECTION 61% COMPARISON:  CT of the abdomen and pelvis from 05/06/2016 FINDINGS: Lower chest: The visualized lung bases are grossly clear. The visualized portions of the mediastinum are unremarkable. Hepatobiliary: The liver is unremarkable in appearance. The gallbladder is unremarkable in appearance. The common bile duct remains normal in caliber. Pancreas: The pancreas is within normal limits. Spleen: The spleen is unremarkable in appearance. Adrenals/Urinary Tract: The adrenal glands are unremarkable in appearance. There is diffuse hemorrhage filling the markedly dilated left renal calyces and left renal pelvis, which may arise within a pocket of fluid directly medial to the proximal left ureter. Additional hemorrhage and free fluid are seen tracking inferiorly along the left lower quadrant, with free fluid seen in the pelvis, concerning for associated urine leak. Minimal underlying soft tissue air is thought to  be postoperative in nature. The left ureteral stent is noted in expected position. There is mildly decreased enhancement of the left side of the horseshoe kidney, concerning for mild disruption of blood supply to the left kidney. The right side of the horseshoe kidney is unremarkable in appearance. Stomach/Bowel: The stomach is unremarkable in appearance. The small bowel is within normal limits. The appendix is not visualized; there is no evidence for appendicitis. The colon is unremarkable in appearance. Mild stranding is noted about the descending colon, reflecting the adjacent hemorrhage and fluid tracking inferiorly from the left side of the horseshoe kidney. Vascular/Lymphatic: The abdominal aorta is unremarkable in appearance. The inferior vena cava is grossly unremarkable. No retroperitoneal lymphadenopathy is seen. No pelvic sidewall lymphadenopathy is identified. Reproductive: The bladder is mildly distended but otherwise unremarkable. The prostate remains normal in size. Other:  Diffuse soft tissue air is noted along the left anterior abdominal wall, tracking into the left side of the scrotum. This appears to reflect the patient's recent laparoscopic surgery. Musculoskeletal: No acute osseous abnormalities are identified. The visualized musculature is unremarkable in appearance. IMPRESSION: 1. Diffuse hemorrhage filling the markedly dilated left renal calyces and left renal pelvis, which may arise within a pocket of fluid directly medial to the proximal left ureter. Additional hemorrhage and free fluid tracking inferiorly along the left lower quadrant, with free fluid in the pelvis, concerning for associated urine leak. Minimal underlying soft tissue air is thought to be postoperative in nature. 2. Mildly decreased enhancement of the left side of the horseshoe kidney, concerning for mild disruption of blood supply to the left kidney. Left ureteral stent noted in expected position. 3. Diffuse soft tissue air along the left anterior abdominal wall, tracking to the left side of the scrotum. This appears reflect the patient's recent laparoscopic surgery. These results were called by telephone at the time of interpretation on 07/15/2016 at 8:29 pm to Dr. Lyndal PulleyANIEL Orlan Aversa, who verbally acknowledged these results. Electronically Signed   By: Roanna RaiderJeffery  Chang M.D.   On: 07/15/2016 20:30    Procedures Procedures (including critical care time)  Emergency Focused Ultrasound Exam Limited ultrasound of the Bladder.   Performed and interpreted by Dr. Clydene PughKnott Indication: evaluation for urinary retention Transverse and Sagittal views of the bladder are obtained and calculations are performed to determine an estimated bladder volume for signs of post-renal obstruction.  Findings: Bladder is empty Interpretation: no evidence of outlet obstruction at level of urethra Images archived electronically.  CPT Codes: [id number] and [phone]   Medications Ordered in ED Medications  fentaNYL (SUBLIMAZE) injection  50 mcg (50 mcg Intravenous Given 07/15/16 2116)  iopamidol (ISOVUE-300) 61 % injection (not administered)  HYDROmorphone (DILAUDID) injection 0.5 mg (not administered)  sodium chloride flush (NS) 0.9 % injection 3 mL (3 mLs Intravenous Given 07/16/16 0100)  acetaminophen (TYLENOL) tablet 650 mg (not administered)    Or  acetaminophen (TYLENOL) suppository 650 mg (not administered)  HYDROcodone-acetaminophen (NORCO/VICODIN) 5-325 MG per tablet 1-2 tablet (not administered)  ondansetron (ZOFRAN) tablet 4 mg (not administered)    Or  ondansetron (ZOFRAN) injection 4 mg (not administered)  piperacillin-tazobactam (ZOSYN) IVPB 3.375 g (3.375 g Intravenous Given 07/16/16 0209)  0.9 %  sodium chloride infusion ( Intravenous New Bag/Given 07/16/16 0031)  HYDROmorphone (DILAUDID) injection 0.5-1 mg (1 mg Intravenous Given 07/16/16 0021)  vancomycin (VANCOCIN) IVPB 750 mg/150 ml premix (not administered)  sodium chloride 0.9 % bolus 1,000 mL (0 mLs Intravenous Stopped 07/15/16 2122)  iopamidol (ISOVUE-300) 61 % injection  100 mL (100 mLs Intravenous Contrast Given 07/15/16 1945)  vancomycin (VANCOCIN) 2,000 mg in sodium chloride 0.9 % 500 mL IVPB (2,000 mg Intravenous Transfusing/Transfer 07/15/16 2311)  ceFEPIme (MAXIPIME) 1 g in dextrose 5 % 50 mL IVPB (0 g Intravenous Stopped 07/15/16 2123)     Initial Impression / Assessment and Plan / ED Course  I have reviewed the triage vital signs and the nursing notes.  Pertinent labs & imaging results that were available during my care of the patient were reviewed by me and considered in my medical decision making (see chart for details).  Clinical Course     36 y.o. male presents with left flank pain 1 week s/p pyeloplasty on left. Urine has been dark with gross blood. Appears ill, WBC dramatically elevated and feels he cannot urinate and bladder is empty. CT ordered to evaluate for complicated source of infection. Fluid resuscitated and IV ABx ordered broad  spectrum. Discussed with Dr Ronne Binning of urology who reviewed CT and was updated on radiology report regarding bleeding and urine leak into intraperitoneal space. He recommended treatment of infection, placement of foley and placement of percutaneous drain with IR when patient is stable with an effort to avoid important structures. Pt currently septic, discussed with hospitalist and will admit for IV ABx and pain control with continued monitoring. Urology to see Pt and consult with IR for management.   Final Clinical Impressions(s) / ED Diagnoses   Final diagnoses:  Urinary tract infection with hematuria, site unspecified  Sepsis, due to unspecified organism (HCC)  Leakage of urine from ureter    New Prescriptions New Prescriptions   No medications on file     Lyndal Pulley, MD 07/16/16 [phone]

## 2016-07-16 ENCOUNTER — Inpatient Hospital Stay (HOSPITAL_COMMUNITY): Payer: Self-pay

## 2016-07-16 LAB — PROTIME-INR
INR: 1.16
PROTHROMBIN TIME: 14.8 s (ref 11.4–15.2)

## 2016-07-16 LAB — CBC
HCT: 27.3 % — ABNORMAL LOW (ref 39.0–52.0)
HCT: 26.1 % — ABNORMAL LOW (ref 39.0–52.0)
HEMATOCRIT: 25.7 % — AB (ref 39.0–52.0)
Hemoglobin: 9.4 g/dL — ABNORMAL LOW (ref 13.0–17.0)
Hemoglobin: 8.9 g/dL — ABNORMAL LOW (ref 13.0–17.0)
Hemoglobin: 9 g/dL — ABNORMAL LOW (ref 13.0–17.0)
MCH: 28 pg (ref 26.0–34.0)
MCH: 28.6 pg (ref 26.0–34.0)
MCH: 28.5 pg (ref 26.0–34.0)
MCHC: 34.4 g/dL (ref 30.0–36.0)
MCHC: 34.6 g/dL (ref 30.0–36.0)
MCHC: 34.5 g/dL (ref 30.0–36.0)
MCV: 81.3 fL (ref 78.0–100.0)
MCV: 82.6 fL (ref 78.0–100.0)
MCV: 82.6 fL (ref 78.0–100.0)
PLATELETS: 373 10*3/uL (ref 150–400)
PLATELETS: 339 10*3/uL (ref 150–400)
Platelets: 350 10*3/uL (ref 150–400)
RBC: 3.36 MIL/uL — AB (ref 4.22–5.81)
RBC: 3.11 MIL/uL — AB (ref 4.22–5.81)
RBC: 3.16 MIL/uL — ABNORMAL LOW (ref 4.22–5.81)
RDW: 13.9 % (ref 11.5–15.5)
RDW: 14.2 % (ref 11.5–15.5)
RDW: 14.1 % (ref 11.5–15.5)
WBC: 24.3 10*3/uL — AB (ref 4.0–10.5)
WBC: 22.5 10*3/uL — AB (ref 4.0–10.5)
WBC: 18.2 10*3/uL — ABNORMAL HIGH (ref 4.0–10.5)

## 2016-07-16 LAB — COMPREHENSIVE METABOLIC PANEL
ALT: 79 U/L — AB (ref 17–63)
AST: 34 U/L (ref 15–41)
Albumin: 3 g/dL — ABNORMAL LOW (ref 3.5–5.0)
Alkaline Phosphatase: 265 U/L — ABNORMAL HIGH (ref 38–126)
Anion gap: 11 (ref 5–15)
BILIRUBIN TOTAL: 2.1 mg/dL — AB (ref 0.3–1.2)
BUN: 22 mg/dL — AB (ref 6–20)
CALCIUM: 8.3 mg/dL — AB (ref 8.9–10.3)
CO2: 24 mmol/L (ref 22–32)
CREATININE: 1.24 mg/dL (ref 0.61–1.24)
Chloride: 92 mmol/L — ABNORMAL LOW (ref 101–111)
Glucose, Bld: 138 mg/dL — ABNORMAL HIGH (ref 65–99)
Potassium: 4.3 mmol/L (ref 3.5–5.1)
Sodium: 127 mmol/L — ABNORMAL LOW (ref 135–145)
TOTAL PROTEIN: 7.5 g/dL (ref 6.5–8.1)

## 2016-07-16 LAB — TYPE AND SCREEN
ABO/RH(D): O POS
ANTIBODY SCREEN: NEGATIVE

## 2016-07-16 LAB — ABO/RH: ABO/RH(D): O POS

## 2016-07-16 LAB — DIC (DISSEMINATED INTRAVASCULAR COAGULATION) PANEL: PROTHROMBIN TIME: 14.3 s (ref 11.4–15.2)

## 2016-07-16 LAB — DIC (DISSEMINATED INTRAVASCULAR COAGULATION)PANEL
D-Dimer, Quant: 11.92 ug/mL-FEU — ABNORMAL HIGH (ref 0.00–0.50)
INR: 1.1
Platelets: 381 10*3/uL (ref 150–400)
aPTT: 36 seconds (ref 24–36)

## 2016-07-16 LAB — PROCALCITONIN: Procalcitonin: 0.74 ng/mL

## 2016-07-16 LAB — MRSA PCR SCREENING: MRSA by PCR: NEGATIVE

## 2016-07-16 LAB — LACTIC ACID, PLASMA
LACTIC ACID, VENOUS: 0.6 mmol/L (ref 0.5–1.9)
LACTIC ACID, VENOUS: 0.8 mmol/L (ref 0.5–1.9)

## 2016-07-16 LAB — MAGNESIUM: Magnesium: 2.2 mg/dL (ref 1.7–2.4)

## 2016-07-16 LAB — TSH: TSH: 0.327 u[IU]/mL — ABNORMAL LOW (ref 0.350–4.500)

## 2016-07-16 LAB — APTT: APTT: 37 s — AB (ref 24–36)

## 2016-07-16 LAB — PHOSPHORUS: Phosphorus: 3.6 mg/dL (ref 2.5–4.6)

## 2016-07-16 MED ORDER — MIDAZOLAM HCL 2 MG/2ML IJ SOLN
INTRAMUSCULAR | Status: AC
  Filled 2016-07-16: qty 6

## 2016-07-16 MED ORDER — ONDANSETRON HCL 4 MG/2ML IJ SOLN: 4.0000 mg | Freq: Four times a day (QID) | INTRAMUSCULAR | Status: DC | PRN

## 2016-07-16 MED ORDER — ONDANSETRON HCL 4 MG PO TABS: 4.0000 mg | ORAL_TABLET | Freq: Four times a day (QID) | ORAL | Status: DC | PRN

## 2016-07-16 MED ORDER — VANCOMYCIN HCL IN DEXTROSE 750-5 MG/150ML-% IV SOLN
750.0000 mg | Freq: Two times a day (BID) | INTRAVENOUS | Status: DC
  Administered 2016-07-16 – 2016-07-18 (×6): 750 mg via INTRAVENOUS
  Filled 2016-07-16 (×8): qty 150

## 2016-07-16 MED ORDER — SODIUM CHLORIDE 0.9 % IV SOLN: INTRAVENOUS | Status: AC

## 2016-07-16 MED ORDER — HYDROCODONE-ACETAMINOPHEN 5-325 MG PO TABS
1.0000 | ORAL_TABLET | ORAL | Status: DC | PRN
  Administered 2016-07-17 – 2016-07-18 (×3): 1 via ORAL
  Administered 2016-07-19 – 2016-07-23 (×5): 2 via ORAL
  Filled 2016-07-16 (×2): qty 2
  Filled 2016-07-16: qty 1
  Filled 2016-07-16: qty 2
  Filled 2016-07-16 (×2): qty 1
  Filled 2016-07-16 (×2): qty 2

## 2016-07-16 MED ORDER — ACETAMINOPHEN 325 MG PO TABS
650.0000 mg | ORAL_TABLET | Freq: Four times a day (QID) | ORAL | Status: DC | PRN
  Administered 2016-07-20: 650 mg via ORAL
  Filled 2016-07-16: qty 2

## 2016-07-16 MED ORDER — MIDAZOLAM HCL 2 MG/2ML IJ SOLN
INTRAMUSCULAR | Status: AC | PRN
  Administered 2016-07-16: 1 mg via INTRAVENOUS

## 2016-07-16 MED ORDER — PIPERACILLIN-TAZOBACTAM 3.375 G IVPB
3.3750 g | Freq: Three times a day (TID) | INTRAVENOUS | Status: DC
  Administered 2016-07-16 – 2016-07-19 (×10): 3.375 g via INTRAVENOUS
  Filled 2016-07-16 (×10): qty 50

## 2016-07-16 MED ORDER — FENTANYL CITRATE (PF) 100 MCG/2ML IJ SOLN
INTRAMUSCULAR | Status: AC
  Filled 2016-07-16: qty 6

## 2016-07-16 MED ORDER — ACETAMINOPHEN 650 MG RE SUPP: 650.0000 mg | Freq: Four times a day (QID) | RECTAL | Status: DC | PRN

## 2016-07-16 MED ORDER — SODIUM CHLORIDE 0.9 % IV SOLN
INTRAVENOUS | Status: AC
  Administered 2016-07-16 (×2): via INTRAVENOUS

## 2016-07-16 MED ORDER — FENTANYL CITRATE (PF) 100 MCG/2ML IJ SOLN
INTRAMUSCULAR | Status: AC | PRN
  Administered 2016-07-16 (×2): 50 ug via INTRAVENOUS

## 2016-07-16 MED ORDER — SODIUM CHLORIDE 0.9% FLUSH
3.0000 mL | Freq: Two times a day (BID) | INTRAVENOUS | Status: DC
  Administered 2016-07-16 – 2016-07-23 (×13): 3 mL via INTRAVENOUS

## 2016-07-16 NOTE — Sedation Documentation (Signed)
Patient is resting comfortably. 

## 2016-07-16 NOTE — Procedures (Signed)
Technically successful CT guided placed of a 10 Fr drainage catheter placement into the left lower quadrant presumed urinoma yielding 300 cc of bloody fluid.   60 cc of aspirated sample sent to the laboratory for analysis.   EBL: None No immediate post procedural complications.   Katherina RightJay Setsuko Robins, MD Pager #: [phone]

## 2016-07-16 NOTE — Consult Note (Signed)
Urology Consult  Referring physician: Dr. 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 His hospital course was uneventful and he was discharged home on 07/10/2015. Last night he presented to the ER with severe, sharp, constant, nonradiating left testicular pain and fevers for the past 2 days. He has associated nausea but no vomiting. He has associated urgency, frequency, dysuria and hematuria which has been present for 5 days. CT scan obtained in the ER shoes the stent in good position. 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   Past Medical History:  Diagnosis Date  . Hypertension    Past Surgical History:  Procedure Laterality Date  . CYSTOSCOPY W/ RETROGRADES Left 07/08/2016   Procedure: CYSTOSCOPY WITH RETROGRADE PYELOGRAM AND STENT PLACEMENT;  Surgeon: Alexis Frock, MD;  Location: [facility];  Service: Urology;  Laterality: Left;  . NO PAST SURGERIES    . ROBOT ASSISTED PYELOPLASTY Left 07/08/2016   Procedure: XI ROBOTIC ASSISTED PYELOPLASTY;  Surgeon: Alexis Frock, MD;  Location: [facility];  Service: Urology;  Laterality: Left;    Medications: I have reviewed the patient's current medications. Allergies:  Allergies  Allergen Reactions  . Doxycycline Rash    Family History  Problem Relation Age of Onset  . Hypertension Father    Social History:  reports that he has never smoked. He has never used smokeless tobacco. He reports that he drinks about 7.2 - 8.4 oz of alcohol per week . He reports that he does not use drugs.  Review of Systems  Constitutional: Positive for chills and fever.  Gastrointestinal: Positive for nausea.  Genitourinary: Positive for  dysuria, flank pain, frequency, hematuria and urgency.  Musculoskeletal: Positive for back pain.  Neurological: Positive for weakness.  All other systems reviewed and are negative.   Physical Exam:  Vital signs in last 24 hours: Temp:  [98.1 F (36.7 C)-100.5 F (38.1 C)] 98.1 F (36.7 C) (01/13 0015) Pulse Rate:  [89-117] 111 (01/13 0600) Resp:  [18-38] 29 (01/13 0600) BP: (94-146)/(59-77) 133/73 (01/13 0600) SpO2:  [94 %-100 %] 95 % (01/13 0600) FiO2 (%):  [21 %] 21 % (01/13 0014) Weight:  [103.1 kg (227 lb 4.7 oz)] 103.1 kg (227 lb 4.7 oz) (01/13 0015) Physical Exam  Constitutional: He is oriented to person, place, and time. He appears well-developed and well-nourished.  HENT:  Head: Normocephalic and atraumatic.  Eyes: EOM are normal. Pupils are equal, round, and reactive to light.  Neck: Normal range of motion. No thyromegaly present.  Cardiovascular: Normal rate and regular rhythm.   Respiratory: Effort normal. No respiratory distress.  GI: Soft. He exhibits no distension and no mass. There is no tenderness. There is no rebound and no guarding. Hernia confirmed negative in the right inguinal area and confirmed negative in the left inguinal area.  Genitourinary: Right testis shows no mass, no swelling and no tenderness. Right testis is descended. Cremasteric reflex is not absent on the right side. Left testis shows tenderness. Left testis shows no mass and no swelling. Left testis is descended. Cremasteric reflex is not absent on the left side. Circumcised. Penile tenderness present. No penile erythema. No discharge found.  Musculoskeletal: Normal range of motion. He exhibits no edema.  Lymphadenopathy:       Right: No inguinal adenopathy present.       Left: No inguinal adenopathy present.  Neurological: He is alert and oriented to person, place, and time.  Skin: Skin is warm and dry.  Psychiatric: He has a normal mood and affect. His behavior is normal. Judgment and thought  content normal.    Laboratory Data:  Results for orders placed or performed during the hospital encounter of 07/15/16 (from the past 72 hour(s))  Urinalysis, Routine w reflex microscopic- may I&O cath if menses     Status: Abnormal   Collection Time: 07/15/16  5:30 PM  Result Value Ref Range   Color, Urine RED (A) YELLOW    Comment: BIOCHEMICALS MAY BE AFFECTED BY COLOR   APPearance TURBID (A) CLEAR   Specific Gravity, Urine 1.025 1.005 - 1.030   pH 6.5 5.0 - 8.0   Glucose, UA 100 (A) NEGATIVE mg/dL   Hgb urine dipstick LARGE (A) NEGATIVE   Bilirubin Urine LARGE (A) NEGATIVE   Ketones, ur 15 (A) NEGATIVE mg/dL   Protein, ur >=300 (A) NEGATIVE mg/dL   Nitrite POSITIVE (A) NEGATIVE   Leukocytes, UA MODERATE (A) NEGATIVE   RBC / HPF TOO NUMEROUS TO COUNT 0 - 5 RBC/hpf   WBC, UA TOO NUMEROUS TO COUNT 0 - 5 WBC/hpf   Bacteria, UA MANY (A) NONE SEEN   Squamous Epithelial / LPF NONE SEEN NONE SEEN   WBC Clumps PRESENT    Mucous PRESENT   Basic metabolic panel     Status: Abnormal   Collection Time: 07/15/16  6:18 PM  Result Value Ref Range   Sodium 125 (L) 135 - 145 mmol/L   Potassium 4.6 3.5 - 5.1 mmol/L   Chloride 88 (L) 101 - 111 mmol/L   CO2 26 22 - 32 mmol/L   Glucose, Bld 149 (H) 65 - 99 mg/dL   BUN 26 (H) 6 - 20 mg/dL   Creatinine, Ser 1.38 (H) 0.61 - 1.24 mg/dL   Calcium 8.7 (L) 8.9 - 10.3 mg/dL   GFR calc non Af Amer >60 >60 mL/min   GFR calc Af Amer >60 >60 mL/min    Comment: (NOTE) The eGFR has been calculated using the CKD EPI equation. This calculation has not been validated in all clinical situations. eGFR's persistently <60 mL/min signify possible Chronic Kidney Disease.    Anion gap 11 5 - 15  CBC     Status: Abnormal   Collection Time: 07/15/16  6:18 PM  Result Value Ref Range   WBC 25.2 (H) 4.0 - 10.5 K/uL   RBC 3.73 (L) 4.22 - 5.81 MIL/uL   Hemoglobin 10.8 (L) 13.0 - 17.0 g/dL   HCT 31.0 (L) 39.0 - 52.0 %   MCV 83.1 78.0 - 100.0 fL   MCH 29.0 26.0 -  34.0 pg   MCHC 34.8 30.0 - 36.0 g/dL   RDW 13.8 11.5 - 15.5 %   Platelets 396 150 - 400 K/uL  Differential     Status: Abnormal   Collection Time: 07/15/16  6:18 PM  Result Value Ref Range   Neutrophils Relative % 91 %   Lymphocytes Relative 4 %   Monocytes Relative 5 %   Eosinophils Relative 0 %   Basophils Relative 0 %   Band Neutrophils 0 %   Metamyelocytes Relative 0 %   Myelocytes 0 %   Promyelocytes Absolute 0 %   Blasts 0 %   nRBC 0 0 /100  WBC   Other 0 %   Neutro Abs 22.9 (H) 1.7 - 7.7 K/uL   Lymphs Abs 1.0 0.7 - 4.0 K/uL   Monocytes Absolute 1.3 (H) 0.1 - 1.0 K/uL   Eosinophils Absolute 0.0 0.0 - 0.7 K/uL   Basophils Absolute 0.0 0.0 - 0.1 K/uL   RBC Morphology STOMATOCYTES    Smear Review LARGE PLATELETS PRESENT   I-Stat CG4 Lactic Acid, ED     Status: None   Collection Time: 07/15/16  8:33 PM  Result Value Ref Range   Lactic Acid, Venous 1.58 0.5 - 1.9 mmol/L  Type and screen [place]     Status: None   Collection Time: 07/16/16  1:31 AM  Result Value Ref Range   ABO/RH(D) O POS    Antibody Screen NEG    Sample Expiration 07/19/2016   ABO/Rh     Status: None   Collection Time: 07/16/16  1:31 AM  Result Value Ref Range   ABO/RH(D) O POS   Lactic acid, plasma     Status: None   Collection Time: 07/16/16  1:37 AM  Result Value Ref Range   Lactic Acid, Venous 0.6 0.5 - 1.9 mmol/L  Procalcitonin     Status: None   Collection Time: 07/16/16  1:37 AM  Result Value Ref Range   Procalcitonin 0.74 ng/mL    Comment:        Interpretation: PCT > 0.5 ng/mL and <= 2 ng/mL: Systemic infection (sepsis) is possible, but other conditions are known to elevate PCT as well. (NOTE)         ICU PCT Algorithm               Non ICU PCT Algorithm    ----------------------------     ------------------------------         PCT < 0.25 ng/mL                 PCT < 0.1 ng/mL     Stopping of antibiotics            Stopping of antibiotics       strongly  encouraged.               strongly encouraged.    ----------------------------     ------------------------------       PCT level decrease by               PCT < 0.25 ng/mL       >= 80% from peak PCT       OR PCT 0.25 - 0.5 ng/mL          Stopping of antibiotics                                             encouraged.     Stopping of antibiotics           encouraged.    ----------------------------     ------------------------------       PCT level decrease by              PCT >= 0.25 ng/mL       < 80% from peak PCT        AND PCT >= 0.5 ng/mL             Continuing antibiotics  encouraged.       Continuing antibiotics            encouraged.    ----------------------------     ------------------------------     PCT level increase compared          PCT > 0.5 ng/mL         with peak PCT AND          PCT >= 0.5 ng/mL             Escalation of antibiotics                                          strongly encouraged.      Escalation of antibiotics        strongly encouraged.   Protime-INR     Status: None   Collection Time: 07/16/16  1:37 AM  Result Value Ref Range   Prothrombin Time 14.8 11.4 - 15.2 seconds   INR 1.16   APTT     Status: Abnormal   Collection Time: 07/16/16  1:37 AM  Result Value Ref Range   aPTT 37 (H) 24 - 36 seconds    Comment:        IF BASELINE aPTT IS ELEVATED, SUGGEST PATIENT RISK ASSESSMENT BE USED TO DETERMINE APPROPRIATE ANTICOAGULANT THERAPY.   DIC (disseminated intravasc coag) panel     Status: Abnormal   Collection Time: 07/16/16  1:37 AM  Result Value Ref Range   Prothrombin Time 14.3 11.4 - 15.2 seconds   INR 1.10    aPTT 36 24 - 36 seconds   Fibrinogen >800 (H) 210 - 475 mg/dL   D-Dimer, Quant 11.92 (H) 0.00 - 0.50 ug/mL-FEU    Comment: (NOTE) At the manufacturer cut-off of 0.50 ug/mL FEU, this assay has been documented to exclude PE with a sensitivity and negative predictive value of 97 to 99%.   At this time, this assay has not been approved by the FDA to exclude DVT/VTE. Results should be correlated with clinical presentation.    Platelets 381 150 - 400 K/uL   Smear Review SMEAR STAINED AND AVAILABLE FOR REVIEW     Comment: NO SCHISTOCYTES SEEN  MRSA PCR Screening     Status: None   Collection Time: 07/16/16  2:47 AM  Result Value Ref Range   MRSA by PCR NEGATIVE NEGATIVE    Comment:        The GeneXpert MRSA Assay (FDA approved for NASAL specimens only), is one component of a comprehensive MRSA colonization surveillance program. It is not intended to diagnose MRSA infection nor to guide or monitor treatment for MRSA infections.   Lactic acid, plasma     Status: None   Collection Time: 07/16/16  3:44 AM  Result Value Ref Range   Lactic Acid, Venous 0.8 0.5 - 1.9 mmol/L  Magnesium     Status: None   Collection Time: 07/16/16  3:44 AM  Result Value Ref Range   Magnesium 2.2 1.7 - 2.4 mg/dL  Phosphorus     Status: None   Collection Time: 07/16/16  3:44 AM  Result Value Ref Range   Phosphorus 3.6 2.5 - 4.6 mg/dL  TSH     Status: Abnormal   Collection Time: 07/16/16  3:44 AM  Result Value Ref Range   TSH 0.327 (L) 0.350 - 4.500 uIU/mL    Comment: Performed by  a 3rd Generation assay with a functional sensitivity of <=0.01 uIU/mL.  Comprehensive metabolic panel     Status: Abnormal   Collection Time: 07/16/16  3:44 AM  Result Value Ref Range   Sodium 127 (L) 135 - 145 mmol/L   Potassium 4.3 3.5 - 5.1 mmol/L   Chloride 92 (L) 101 - 111 mmol/L   CO2 24 22 - 32 mmol/L   Glucose, Bld 138 (H) 65 - 99 mg/dL   BUN 22 (H) 6 - 20 mg/dL   Creatinine, Ser 1.24 0.61 - 1.24 mg/dL   Calcium 8.3 (L) 8.9 - 10.3 mg/dL   Total Protein 7.5 6.5 - 8.1 g/dL   Albumin 3.0 (L) 3.5 - 5.0 g/dL   AST 34 15 - 41 U/L   ALT 79 (H) 17 - 63 U/L   Alkaline Phosphatase 265 (H) 38 - 126 U/L   Total Bilirubin 2.1 (H) 0.3 - 1.2 mg/dL   GFR calc non Af Amer >60 >60 mL/min   GFR calc Af Amer >60  >60 mL/min    Comment: (NOTE) The eGFR has been calculated using the CKD EPI equation. This calculation has not been validated in all clinical situations. eGFR's persistently <60 mL/min signify possible Chronic Kidney Disease.    Anion gap 11 5 - 15  CBC     Status: Abnormal   Collection Time: 07/16/16  3:44 AM  Result Value Ref Range   WBC 24.3 (H) 4.0 - 10.5 K/uL   RBC 3.36 (L) 4.22 - 5.81 MIL/uL   Hemoglobin 9.4 (L) 13.0 - 17.0 g/dL   HCT 27.3 (L) 39.0 - 52.0 %   MCV 81.3 78.0 - 100.0 fL   MCH 28.0 26.0 - 34.0 pg   MCHC 34.4 30.0 - 36.0 g/dL   RDW 13.9 11.5 - 15.5 %   Platelets 373 150 - 400 K/uL   Recent Results (from the past 240 hour(s))  MRSA PCR Screening     Status: None   Collection Time: 07/16/16  2:47 AM  Result Value Ref Range Status   MRSA by PCR NEGATIVE NEGATIVE Final    Comment:        The GeneXpert MRSA Assay (FDA approved for NASAL specimens only), is one component of a comprehensive MRSA colonization surveillance program. It is not intended to diagnose MRSA infection nor to guide or monitor treatment for MRSA infections.    Creatinine:  Recent Labs  07/15/16 1818 07/16/16 0344  CREATININE 1.38* 1.24   Baseline Creatinine: 1  Impression/Assessment:  35yo with left pyelonephritis with associated gross hematuria and urinoma  Plan:  1. Sepsis management per ICU/hospitalist service 2. Please continue foley catheter to straight drain 3. I discussed the management of a urinoma with the patient which involved drain placement by interventional radiology. Please consult IR for placement of a drain placement into the urinoma. Urology to continue to follow  Nicolette Bang 07/16/2016, 7:56 AM

## 2016-07-16 NOTE — Progress Notes (Signed)
PROGRESS NOTE    [name] [name]  YQM:[id number] DOB: 11/18/1980 DOA: 07/15/2016 PCP: Jaclyn Shaggy, MD   Brief Narrative:  [name] [name] is a 36 y.o. 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 On 07/08/2016 patient underwent  a robotic assisted laparoscopic left dismembered pyeloplasty also had an insertion of left ureteral stent he was able to be discharged to home in good health but continued to have left scrotal pain at first mild 2 Days ago he developed pain that is constant and throbbing he also had associated low-grade temperature continued to have good urine output  But yesterday he developed severe left flank pain and hematuria. Pain is severe radiating to the testicles on the left side as well as left flank.  Regarding pertinent Chronic problems: Patient has known history of hypertension controlled with lisinopril and hydrochlorothiazide  IN ER:  Temp (24hrs), Avg:99.5 F (37.5 C), Min:98.5 F (36.9 C), Max:100.5 F (38.1 C) RR 18 sat 97% Hr 94 Bp 114/70 Lactic acid 1.58 Sodium 125 K4.6 chloride 88 bicarbonate 26 creatinine 1.38 which is up from baseline   AG 11 WBC 25.2 hemoglobin 10.8.  UA positive for white blood cells and red blood cells with bacteria and nitrites  CT showing: Diffuse hemorrhage feeling markedly dilated left renal calyces and left renal pelvis origin free fluid in the left lower quadrant free fluid in the pelvis possible urine leak mildly decreased enhancement of the left side of caution kidney worrisome for mild disruption of the blood supply.   Assessment & Plan:   Active Problems:   Hypertension   Hyperlipidemia   UPJ (ureteropelvic  junction) obstruction   UTI (urinary tract infection)   Hyponatremia   Anemia   Sepsis (HCC)   Renal hemorrhage with a urine leak resulting in sepsis -  Urology, IR and PCCM consulted - monitor serial H/H every 8 hours - NPO  - monitor kidney function - Admit per Sepsis protocol likely source being UTI vs intra-abdominal infection - rehydrate with 18ml/kg - initiate broad spectrum antibiotics Vancomycin and Zosyn -  obtain blood cultures - lactic acid of 0.8 this am - procalcitonin of 0.74 this am - Admit and monitor vital signs closely - per patient he is to go to the OR today    Hyperlipidemia - hold PO medications for now  Hypertension  -hold lisinopril due to AKI - hold HCTZ monitor for hypotension   UTI (urinary tract infection)  - urine culture pending  - continue vancomycin and zosyn  Hyponatremia - improved to 127 today - continue IVF - likely 2/2 dehydration  Anemia suspect acute blood loss secondary to postoperative bleeding - Type and screen ordered - Serial CBC   DVT prophylaxis:  SCD  Code Status:  FULL CODE as per patient   Family Communication:  male family member is bedside   Disposition Plan: guarded, continue to monitor closely in SDU   Consultants:   Urology  PCCM  General Surgery (pt discussed by EDP)  IR  Procedures:   none  Antimicrobials:   Vancomycin  Zosyn    Subjective: Patient is awake laying in bed.  Spanish speaking only so interpreter used- [place] on computer.  He states he feels  slightly better this morning than he did when he first came in but reports that he is still having left lower back pain that radiates to his left testicle.  No chest pain, chest pressure, shortness of breath or increased work of breathing. No fevers or chills.  States that the urologist has already seen him and will take him to put a catheter in his vein.  Patient did not have any questions.  Objective: Vitals:   07/16/16 0300  07/16/16 0400 07/16/16 0500 07/16/16 0600  BP: 129/75 (!) 142/68 (!) 146/68 133/73  Pulse: (!) 112 (!) 111 (!) 110 (!) 111  Resp: (!) 28 (!) 32 (!) 22 (!) 29  Temp:      TempSrc:      SpO2: 96% 96% 96% 95%  Weight:      Height:        Intake/Output Summary (Last 24 hours) at 07/16/16 0749 Last data filed at 07/16/16 0700  Gross per 24 hour  Intake          1648.33 ml  Output              680 ml  Net           968.33 ml   Filed Weights   07/16/16 0015  Weight: 103.1 kg (227 lb 4.7 oz)    Examination:  General exam: Appears calm and comfortable  Respiratory system: Clear to auscultation. Respiratory effort normal. Cardiovascular system: S1 & S2 heard, RRR. No JVD, rubs, gallops or clicks. No pedal edema. Gastrointestinal system: Abdomen is nondistended, soft and nontender. No organomegaly or masses felt. Normal bowel sounds heard.  Foley catheter in place draining pink urine Central nervous system: Alert and oriented. No focal neurological deficits. Extremities: Symmetric 5 x 5 power. Skin: No rashes, lesions or ulcers Psychiatry: Judgement and insight appear normal. Mood & affect appropriate.     Data Reviewed: I have personally reviewed following labs and imaging studies  CBC:  Recent Labs Lab 07/15/16 1818 07/16/16 0137 07/16/16 0344  WBC 25.2*  --  24.3*  NEUTROABS 22.9*  --   --   HGB 10.8*  --  9.4*  HCT 31.0*  --  27.3*  MCV 83.1  --  81.3  PLT 396 381 373   Basic Metabolic Panel:  Recent Labs Lab 07/15/16 1818 07/16/16 0344  NA 125* 127*  K 4.6 4.3  CL 88* 92*  CO2 26 24  GLUCOSE 149* 138*  BUN 26* 22*  CREATININE 1.38* 1.24  CALCIUM 8.7* 8.3*  MG  --  2.2  PHOS  --  3.6   GFR: Estimated Creatinine Clearance: 96.1 mL/min (by C-G formula based on SCr of 1.24 mg/dL). Liver Function Tests:  Recent Labs Lab 07/16/16 0344  AST 34  ALT 79*  ALKPHOS 265*  BILITOT 2.1*  PROT 7.5  ALBUMIN 3.0*   No results for input(s): LIPASE, AMYLASE  in the last 168 hours. No results for input(s): AMMONIA in the last 168 hours. Coagulation Profile:  Recent Labs Lab 07/16/16 0137  INR 1.10  1.16   Cardiac Enzymes: No results for input(s): CKTOTAL, CKMB, CKMBINDEX, TROPONINI in the last 168 hours. BNP (last 3 results) No results for input(s): PROBNP in the last 8760 hours. HbA1C: No results for input(s): HGBA1C in the last 72 hours. CBG: No results for input(s): GLUCAP in the last 168 hours. Lipid Profile: No results for input(s): CHOL, HDL, LDLCALC, TRIG, CHOLHDL, LDLDIRECT in the last 72 hours. Thyroid Function  Tests:  Recent Labs  07/16/16 0344  TSH 0.327*   Anemia Panel: No results for input(s): VITAMINB12, FOLATE, FERRITIN, TIBC, IRON, RETICCTPCT in the last 72 hours. Sepsis Labs:  Recent Labs Lab 07/15/16 2033 07/16/16 0137 07/16/16 0344  PROCALCITON  --  0.74  --   LATICACIDVEN 1.58 0.6 0.8    Recent Results (from the past 240 hour(s))  MRSA PCR Screening     Status: None   Collection Time: 07/16/16  2:47 AM  Result Value Ref Range Status   MRSA by PCR NEGATIVE NEGATIVE Final    Comment:        The GeneXpert MRSA Assay (FDA approved for NASAL specimens only), is one component of a comprehensive MRSA colonization surveillance program. It is not intended to diagnose MRSA infection nor to guide or monitor treatment for MRSA infections.          Radiology Studies: Ct Abdomen Pelvis W Contrast  Result Date: 07/15/2016 CLINICAL DATA:  Acute onset of right flank pain and hematuria. Status post pyeloplasty. Initial encounter. EXAM: CT ABDOMEN AND PELVIS WITH CONTRAST TECHNIQUE: Multidetector CT imaging of the abdomen and pelvis was performed using the standard protocol following bolus administration of intravenous contrast. CONTRAST:  ISOVUE-300 IOPAMIDOL (ISOVUE-300) INJECTION 61% COMPARISON:  CT of the abdomen and pelvis from 05/06/2016 FINDINGS: Lower chest: The visualized lung bases are  grossly clear. The visualized portions of the mediastinum are unremarkable. Hepatobiliary: The liver is unremarkable in appearance. The gallbladder is unremarkable in appearance. The common bile duct remains normal in caliber. Pancreas: The pancreas is within normal limits. Spleen: The spleen is unremarkable in appearance. Adrenals/Urinary Tract: The adrenal glands are unremarkable in appearance. There is diffuse hemorrhage filling the markedly dilated left renal calyces and left renal pelvis, which may arise within a pocket of fluid directly medial to the proximal left ureter. Additional hemorrhage and free fluid are seen tracking inferiorly along the left lower quadrant, with free fluid seen in the pelvis, concerning for associated urine leak. Minimal underlying soft tissue air is thought to be postoperative in nature. The left ureteral stent is noted in expected position. There is mildly decreased enhancement of the left side of the horseshoe kidney, concerning for mild disruption of blood supply to the left kidney. The right side of the horseshoe kidney is unremarkable in appearance. Stomach/Bowel: The stomach is unremarkable in appearance. The small bowel is within normal limits. The appendix is not visualized; there is no evidence for appendicitis. The colon is unremarkable in appearance. Mild stranding is noted about the descending colon, reflecting the adjacent hemorrhage and fluid tracking inferiorly from the left side of the horseshoe kidney. Vascular/Lymphatic: The abdominal aorta is unremarkable in appearance. The inferior vena cava is grossly unremarkable. No retroperitoneal lymphadenopathy is seen. No pelvic sidewall lymphadenopathy is identified. Reproductive: The bladder is mildly distended but otherwise unremarkable. The prostate remains normal in size. Other: Diffuse soft tissue air is noted along the left anterior abdominal wall, tracking into the left side of the scrotum. This appears to reflect  the patient's recent laparoscopic surgery. Musculoskeletal: No acute osseous abnormalities are identified. The visualized musculature is unremarkable in appearance. IMPRESSION: 1. Diffuse hemorrhage filling the markedly dilated left renal calyces and left renal pelvis, which may arise within a pocket of fluid directly medial to the proximal left ureter. Additional hemorrhage and free fluid tracking inferiorly along the left lower quadrant, with free fluid in the pelvis, concerning for associated urine leak. Minimal underlying soft tissue  air is thought to be postoperative in nature. 2. Mildly decreased enhancement of the left side of the horseshoe kidney, concerning for mild disruption of blood supply to the left kidney. Left ureteral stent noted in expected position. 3. Diffuse soft tissue air along the left anterior abdominal wall, tracking to the left side of the scrotum. This appears reflect the patient's recent laparoscopic surgery. These results were called by telephone at the time of interpretation on 07/15/2016 at 8:29 pm to Dr. Lyndal Pulley, who verbally acknowledged these results. Electronically Signed   By: Roanna Raider M.D.   On: 07/15/2016 20:30        Scheduled Meds: .  HYDROmorphone (DILAUDID) injection  0.5 mg Intravenous Once  . piperacillin-tazobactam  3.375 g Intravenous Q8H  . sodium chloride flush  3 mL Intravenous Q12H  . vancomycin  750 mg Intravenous Q12H   Continuous Infusions: . sodium chloride 100 mL/hr at 07/16/16 0031     LOS: 1 day    Time spent: 35 minutes    Katrinka Blazing, MD [facility] Pager [phone]  If 7PM-7AM, please contact night-coverage [email or web address] Password [facility] 07/16/2016, 7:49 AM

## 2016-07-16 NOTE — Progress Notes (Signed)
Pharmacy Antibiotic Note  [name] is a 36 y.o. male with hx of HTN and recent robotic assisted laparoscopic procedure (1/5) now with flank pain and hepaturia admitted on 07/15/2016 with sepsis/intra-abdominal infection.  Pharmacy has been consulted for zosyn/vancomycin dosing.  Plan: Zosyn 3.375g IV q8h (4 hour infusion).  Vancomycin 2 Gm x1 then 750 mg IV q12h (CrCl~76 N)   VT=15-20 mg/L F/u Scr/cultures/levels as needed  Height: 5\' 7"  (170.2 cm) Weight: 227 lb 4.7 oz (103.1 kg) IBW/kg (Calculated) : 66.1  Temp (24hrs), Avg:99 F (37.2 C), Min:98.1 F (36.7 C), Max:100.5 F (38.1 C)   Recent Labs Lab 07/09/16 0348 07/15/16 1818 07/15/16 2033  WBC  --  25.2*  --   CREATININE 0.91 1.38*  --   LATICACIDVEN  --   --  1.58    Estimated Creatinine Clearance: 86.3 mL/min (by C-G formula based on SCr of 1.38 mg/dL (H)).    Allergies  Allergen Reactions  . Doxycycline Rash    Antimicrobials this admission: 1/12 cefepime >> x1 ED 1/12 vancomycin >>  1/13 zosyn >>  Dose adjustments this admission:   Microbiology results:  BCx:   UCx:    Sputum:    MRSA PCR:   Thank you for allowing pharmacy to be a part of this patient's care.  Lorenza EvangelistGreen, Kenyah Luba R 07/16/2016 12:38 AM

## 2016-07-16 NOTE — Consult Note (Signed)
Chief Complaint: Patient was seen in consultation today for left low quadrant fluid collection drain placement Chief Complaint  Patient presents with  . Hematuria   at the request of Dr Janeece AgeeP McKenzie  Referring Physician(s): Dr Janeece AgeeP Mckenzie  Supervising Physician: Simonne ComeWatts, John  [name]  History of Present Illness: [name] [name] is a 36 y.o. male   S/P left robotic pyeloplasty---L UPJ obstruction Discharged 07/09/2016  Presented to ED 1/12 pm with left testicular pain; fever + dysuria; hematuria CT shows good placement of stent but clot in renal pelvis and large left retroperitoneal/pelvic fluid collection 1/12 CT IMPRESSION: 1. Diffuse hemorrhage filling the markedly dilated left renal calyces and left renal pelvis, which may arise within a pocket of fluid directly medial to the proximal left ureter. Additional hemorrhage and free fluid tracking inferiorly along the left lower quadrant, with free fluid in the pelvis, concerning for associated urine leak. Minimal underlying soft tissue air is thought to be postoperative in nature. 2. Mildly decreased enhancement of the left side of the horseshoe kidney, concerning for mild disruption of blood supply to the left kidney. Left ureteral stent noted in expected position. 3. Diffuse soft tissue air along the left anterior abdominal wall, tracking to the left side of the scrotum. This appears reflect the patient's recent laparoscopic surgery.  Request now for fluid collection drain placement Dr Grace IsaacWatts has reviewed imaging and approves procedure    Past Medical History:  Diagnosis Date  . Hypertension     Past Surgical History:  Procedure Laterality Date  . CYSTOSCOPY W/ RETROGRADES Left 07/08/2016   Procedure: CYSTOSCOPY WITH RETROGRADE PYELOGRAM AND STENT PLACEMENT;  Surgeon: Sebastian Acheheodore Manny, MD;  Location: [facility];  Service: Urology;  Laterality: Left;  . NO PAST SURGERIES    . ROBOT ASSISTED PYELOPLASTY Left  07/08/2016   Procedure: XI ROBOTIC ASSISTED PYELOPLASTY;  Surgeon: Sebastian Acheheodore Manny, MD;  Location: [facility];  Service: Urology;  Laterality: Left;    Allergies: Doxycycline  Medications: Prior to Admission medications   Medication Sig Start Date End Date Taking? Authorizing Provider  atorvastatin (LIPITOR) 10 MG tablet Take 1 tablet (10 mg total) by mouth daily. 06/03/16  Yes Jaclyn ShaggyEnobong Amao, MD  lisinopril-hydrochlorothiazide (PRINZIDE,ZESTORETIC) 10-12.5 MG tablet Take 1 tablet by mouth daily. 06/03/16  Yes Jaclyn ShaggyEnobong Amao, MD  oxyCODONE-acetaminophen (ROXICET) 5-325 MG tablet Take 1-2 tablets by mouth every 4 (four) hours as needed for severe pain. 07/08/16  Yes Carolin Coyroy A Sukhu, MD  tamsulosin (FLOMAX) 0.4 MG CAPS capsule Take 1 capsule (0.4 mg total) by mouth daily. 07/08/16  Yes Carolin Coyroy A Sukhu, MD  cyclobenzaprine (FLEXERIL) 10 MG tablet Take 1 tablet (10 mg total) by mouth 3 (three) times daily as needed for muscle spasms. Patient not taking: Reported on 07/15/2016 06/03/16   Jaclyn ShaggyEnobong Amao, MD     Family History  Problem Relation Age of Onset  . Hypertension Father     Social History   Social History  . Marital status: Legally Separated    Spouse name: N/A  . Number of children: N/A  . Years of education: N/A   Social History Main Topics  . Smoking status: Never Smoker  . Smokeless tobacco: Never Used  . Alcohol use 7.2 - 8.4 oz/week    12 - 14 Cans of beer per week  . Drug use: No  . Sexual activity: Not Asked   Other Topics Concern  . None   Social History Narrative  . None  Review of Systems: A 12 point ROS discussed and pertinent positives are indicated in the HPI above.  All other systems are negative.  Review of Systems  Constitutional: Positive for activity change, appetite change and fever. Negative for fatigue.  Respiratory: Negative for shortness of breath.   Gastrointestinal: Positive for nausea. Negative for abdominal distention and abdominal pain.  Genitourinary:  Positive for dysuria and hematuria.  Psychiatric/Behavioral: Negative for behavioral problems and confusion.    Vital Signs: BP 133/73   Pulse (!) 111   Temp 98.1 F (36.7 C) (Oral)   Resp (!) 29   Ht 5\' 7"  (1.702 m)   Wt 227 lb 4.7 oz (103.1 kg)   SpO2 95%   BMI 35.60 kg/m   Physical Exam  Constitutional: He is oriented to person, place, and time.  Cardiovascular: Normal rate, regular rhythm and normal heart sounds.   Pulmonary/Chest: Effort normal and breath sounds normal.  Abdominal: Soft. Bowel sounds are normal. There is no tenderness.  Musculoskeletal: Normal range of motion.  Neurological: He is alert and oriented to person, place, and time.  Skin: Skin is warm and dry.  Psychiatric: He has a normal mood and affect. His behavior is normal. Judgment and thought content normal.  Speaks only Spanish Interpreter used--via phone See consent for name and # of interpreter  Nursing note and vitals reviewed.   Mallampati Score:  MD Evaluation Airway: WNL Heart: WNL Abdomen: WNL Chest/ Lungs: WNL ASA  Classification: 2 Mallampati/Airway Score: Two  Imaging: Dg Abd 1 View  Result Date: 07/08/2016 CLINICAL DATA:  Left-sided cystoscopy with stenting. EXAM: ABDOMEN - 1 VIEW COMPARISON:  05/06/2016 abdominal CT FINDINGS: Left retrograde ureteropyelogram shows stenosis in the expected region of the UPJ (low from horseshoe kidney), with contrast dilution in the patulous renal pelvis. Subsequently a ureteral stent was placed. IMPRESSION: Left UPJ stenosis status post stenting. Electronically Signed   By: Marnee Spring M.D.   On: 07/08/2016 12:41   Ct Abdomen Pelvis W Contrast  Result Date: 07/15/2016 CLINICAL DATA:  Acute onset of right flank pain and hematuria. Status post pyeloplasty. Initial encounter. EXAM: CT ABDOMEN AND PELVIS WITH CONTRAST TECHNIQUE: Multidetector CT imaging of the abdomen and pelvis was performed using the standard protocol following bolus administration  of intravenous contrast. CONTRAST:  ISOVUE-300 IOPAMIDOL (ISOVUE-300) INJECTION 61% COMPARISON:  CT of the abdomen and pelvis from 05/06/2016 FINDINGS: Lower chest: The visualized lung bases are grossly clear. The visualized portions of the mediastinum are unremarkable. Hepatobiliary: The liver is unremarkable in appearance. The gallbladder is unremarkable in appearance. The common bile duct remains normal in caliber. Pancreas: The pancreas is within normal limits. Spleen: The spleen is unremarkable in appearance. Adrenals/Urinary Tract: The adrenal glands are unremarkable in appearance. There is diffuse hemorrhage filling the markedly dilated left renal calyces and left renal pelvis, which may arise within a pocket of fluid directly medial to the proximal left ureter. Additional hemorrhage and free fluid are seen tracking inferiorly along the left lower quadrant, with free fluid seen in the pelvis, concerning for associated urine leak. Minimal underlying soft tissue air is thought to be postoperative in nature. The left ureteral stent is noted in expected position. There is mildly decreased enhancement of the left side of the horseshoe kidney, concerning for mild disruption of blood supply to the left kidney. The right side of the horseshoe kidney is unremarkable in appearance. Stomach/Bowel: The stomach is unremarkable in appearance. The small bowel is within normal limits. The appendix  is not visualized; there is no evidence for appendicitis. The colon is unremarkable in appearance. Mild stranding is noted about the descending colon, reflecting the adjacent hemorrhage and fluid tracking inferiorly from the left side of the horseshoe kidney. Vascular/Lymphatic: The abdominal aorta is unremarkable in appearance. The inferior vena cava is grossly unremarkable. No retroperitoneal lymphadenopathy is seen. No pelvic sidewall lymphadenopathy is identified. Reproductive: The bladder is mildly distended but  otherwise unremarkable. The prostate remains normal in size. Other: Diffuse soft tissue air is noted along the left anterior abdominal wall, tracking into the left side of the scrotum. This appears to reflect the patient's recent laparoscopic surgery. Musculoskeletal: No acute osseous abnormalities are identified. The visualized musculature is unremarkable in appearance. IMPRESSION: 1. Diffuse hemorrhage filling the markedly dilated left renal calyces and left renal pelvis, which may arise within a pocket of fluid directly medial to the proximal left ureter. Additional hemorrhage and free fluid tracking inferiorly along the left lower quadrant, with free fluid in the pelvis, concerning for associated urine leak. Minimal underlying soft tissue air is thought to be postoperative in nature. 2. Mildly decreased enhancement of the left side of the horseshoe kidney, concerning for mild disruption of blood supply to the left kidney. Left ureteral stent noted in expected position. 3. Diffuse soft tissue air along the left anterior abdominal wall, tracking to the left side of the scrotum. This appears reflect the patient's recent laparoscopic surgery. These results were called by telephone at the time of interpretation on 07/15/2016 at 8:29 pm to Dr. Lyndal Pulley, who verbally acknowledged these results. Electronically Signed   By: Roanna Raider M.D.   On: 07/15/2016 20:30    Labs:  CBC:  Recent Labs  06/30/16 0948 07/08/16 1646 07/09/16 0348 07/15/16 1818 07/16/16 0137 07/16/16 0344  WBC 6.1  --   --  25.2*  --  24.3*  HGB 15.2 14.2 13.5 10.8*  --  9.4*  HCT 45.7 41.8 41.1 31.0*  --  27.3*  PLT 212  --   --  396 381 373    COAGS:  Recent Labs  07/16/16 0137  INR 1.10  1.16  APTT 36  37*    BMP:  Recent Labs  07/08/16 1817 07/09/16 0348 07/15/16 1818 07/16/16 0344  NA 137 134* 125* 127*  K 4.1 4.3 4.6 4.3  CL 101 99* 88* 92*  CO2 27 26 26 24   GLUCOSE 128* 131* 149* 138*  BUN 12 12  26* 22*  CALCIUM 9.1 9.0 8.7* 8.3*  CREATININE 0.99 0.91 1.38* 1.24  GFRNONAA >60 >60 >60 >60  GFRAA >60 >60 >60 >60    LIVER FUNCTION TESTS:  Recent Labs  03/28/16 1229 07/16/16 0344  BILITOT 0.5 2.1*  AST 26 34  ALT 35 79*  ALKPHOS 61 265*  PROT 7.5 7.5  ALBUMIN 4.7 3.0*    TUMOR MARKERS: No results for input(s): AFPTM, CEA, CA199, CHROMGRNA in the last 8760 hours.  Assessment and Plan:  Left retroperitoneal /pelvic fluid collection post left robotic pyeloplasty Now scheduled for drain placement Risks and Benefits discussed with the patient through phone interpreter including bleeding, infection, damage to adjacent structures, bowel perforation/fistula connection, and sepsis. All of the patient's questions were answered, patient is agreeable to proceed. Consent signed and in chart.   Thank you for this interesting consult.  I greatly enjoyed meeting Jasher Barkan and look forward to participating in their care.  A copy of this report was sent to the requesting provider  on this date.  Electronically Signed: Ralene Muskrat A 07/16/2016, 9:18 AM   I spent a total of 40 Minutes    in face to face in clinical consultation, greater than 50% of which was counseling/coordinating care for left retroperitoneal/pelvic fluid collection drain placement

## 2016-07-17 LAB — CBC
HCT: 24.9 % — ABNORMAL LOW (ref 39.0–52.0)
HCT: 24.9 % — ABNORMAL LOW (ref 39.0–52.0)
HCT: 25.3 % — ABNORMAL LOW (ref 39.0–52.0)
HEMOGLOBIN: 8.5 g/dL — AB (ref 13.0–17.0)
HEMOGLOBIN: 8.7 g/dL — AB (ref 13.0–17.0)
Hemoglobin: 8.6 g/dL — ABNORMAL LOW (ref 13.0–17.0)
MCH: 28.3 pg (ref 26.0–34.0)
MCH: 28.6 pg (ref 26.0–34.0)
MCH: 29 pg (ref 26.0–34.0)
MCHC: 34 g/dL (ref 30.0–36.0)
MCHC: 34.1 g/dL (ref 30.0–36.0)
MCHC: 34.9 g/dL (ref 30.0–36.0)
MCV: 83 fL (ref 78.0–100.0)
MCV: 83.2 fL (ref 78.0–100.0)
MCV: 83.8 fL (ref 78.0–100.0)
PLATELETS: 350 10*3/uL (ref 150–400)
PLATELETS: 388 10*3/uL (ref 150–400)
Platelets: 430 10*3/uL — ABNORMAL HIGH (ref 150–400)
RBC: 2.97 MIL/uL — ABNORMAL LOW (ref 4.22–5.81)
RBC: 3 MIL/uL — ABNORMAL LOW (ref 4.22–5.81)
RBC: 3.04 MIL/uL — ABNORMAL LOW (ref 4.22–5.81)
RDW: 14.3 % (ref 11.5–15.5)
RDW: 14.4 % (ref 11.5–15.5)
RDW: 14.4 % (ref 11.5–15.5)
WBC: 16.8 10*3/uL — ABNORMAL HIGH (ref 4.0–10.5)
WBC: 17.8 10*3/uL — AB (ref 4.0–10.5)
WBC: 19.8 10*3/uL — ABNORMAL HIGH (ref 4.0–10.5)

## 2016-07-17 LAB — BASIC METABOLIC PANEL
Anion gap: 10 (ref 5–15)
BUN: 21 mg/dL — AB (ref 6–20)
CO2: 24 mmol/L (ref 22–32)
CREATININE: 0.96 mg/dL (ref 0.61–1.24)
Calcium: 8.2 mg/dL — ABNORMAL LOW (ref 8.9–10.3)
Chloride: 97 mmol/L — ABNORMAL LOW (ref 101–111)
GFR calc Af Amer: >60 mL/min (ref >60)
Glucose, Bld: 107 mg/dL — ABNORMAL HIGH (ref 65–99)
Potassium: 4.1 mmol/L (ref 3.5–5.1)
SODIUM: 131 mmol/L — AB (ref 135–145)

## 2016-07-17 LAB — URINE CULTURE: Culture: NO GROWTH

## 2016-07-17 NOTE — Progress Notes (Signed)
Subjective: Patient reports improved pain control and less left flank and testicular pain. The pt has 1.3L from his pelvic drain yesterday. Creatinine improved.   Objective: Vital signs in last 24 hours: Temp:  [98.3 F (36.8 C)-99.5 F (37.5 C)] 99.1 F (37.3 C) (01/14 2000) Pulse Rate:  [87-100] 98 (01/14 2000) Resp:  [18-28] 25 (01/14 2000) BP: (105-129)/(63-80) 105/78 (01/14 2000) SpO2:  [92 %-98 %] 97 % (01/14 2000) Weight:  [104.1 kg (229 lb 8 oz)] 104.1 kg (229 lb 8 oz) (01/14 0000)  Intake/Output from previous day: 01/13 0701 - 01/14 0700 In: 1680 [P.O.:600; I.V.:805; IV Piggyback:250] Out: 4110 [Urine:2435; Drains:1675] Intake/Output this shift: Total I/O In: 200 [IV Piggyback:200] Out: -   Physical Exam:  General:alert, cooperative and appears stated age GI: soft, non tender, normal bowel sounds, no palpable masses, no organomegaly, no inguinal hernia Male genitalia: not done Extremities: extremities normal, atraumatic, no cyanosis or edema  Lab Results:  Recent Labs  07/17/16 0024 07/17/16 0717 07/17/16 1344  HGB 8.7* 8.6* 8.5*  HCT 24.9* 25.3* 24.9*   BMET  Recent Labs  07/16/16 0344 07/17/16 0845  NA 127* 131*  K 4.3 4.1  CL 92* 97*  CO2 24 24  GLUCOSE 138* 107*  BUN 22* 21*  CREATININE 1.24 0.96  CALCIUM 8.3* 8.2*    Recent Labs  07/16/16 0137  INR 1.10  1.16   No results for input(s): LABURIN in the last 72 hours. Results for orders placed or performed during the hospital encounter of 07/15/16  Urine culture     Status: None   Collection Time: 07/15/16  5:30 PM  Result Value Ref Range Status   Specimen Description URINE, CLEAN CATCH  Final   Special Requests NONE  Final   Culture NO GROWTH [facility]   Final   Report Status 07/17/2016 FINAL  Final  Blood culture (routine x 2)     Status: None (Preliminary result)   Collection Time: 07/15/16  8:34 PM  Result Value Ref Range Status   Specimen Description  BLOOD RIGHT ANTECUBITAL  Final   Special Requests BOTTLES DRAWN AEROBIC AND ANAEROBIC 5CC EACH  Final   Culture   Final    NO GROWTH 1 DAY [facility]    Report Status PENDING  Incomplete  Blood culture (routine x 2)     Status: None (Preliminary result)   Collection Time: 07/15/16  8:43 PM  Result Value Ref Range Status   Specimen Description BLOOD LEFT ANTECUBITAL  Final   Special Requests BOTTLES DRAWN AEROBIC AND ANAEROBIC 5CC EACH  Final   Culture   Final    NO GROWTH 1 DAY [facility]    Report Status PENDING  Incomplete  MRSA PCR Screening     Status: None   Collection Time: 07/16/16  2:47 AM  Result Value Ref Range Status   MRSA by PCR NEGATIVE NEGATIVE Final    Comment:        The GeneXpert MRSA Assay (FDA approved for NASAL specimens only), is one component of a comprehensive MRSA colonization surveillance program. It is not intended to diagnose MRSA infection nor to guide or monitor treatment for MRSA infections.   Aerobic/Anaerobic Culture (surgical/deep wound)     Status: None (Preliminary result)   Collection Time: 07/16/16 11:13 AM  Result Value Ref Range Status   Specimen Description DRAINAGE URINOMA  Final   Special Requests Normal  Final  Gram Stain   Final    ABUNDANT WBC PRESENT, PREDOMINANTLY MONONUCLEAR NO ORGANISMS SEEN    Culture   Final    NO GROWTH 1 DAY Performed at [facility]    Report Status PENDING  Incomplete    Studies/Results: Ct Image Guided Drainage By Percutaneous Catheter  Result Date: 07/16/2016 INDICATION: History of horseshoe kidney, post left-sided pyloroplasty and left-sided double-J ureteral stent placement, admitted with abdominal pain and fever found to have a serpiginous fluid collection within the left pericolic gutter worrisome for a urinoma. Please perform CT-guided percutaneous drainage catheter placement. EXAM: CT IMAGE GUIDED DRAINAGE BY PERCUTANEOUS CATHETER  COMPARISON:  CT abdomen pelvis - 07/15/2016; 05/06/2016 MEDICATIONS: The patient is currently admitted to the hospital and receiving intravenous antibiotics. The antibiotics were administered within an appropriate time frame prior to the initiation of the procedure. ANESTHESIA/SEDATION: Moderate (conscious) sedation was employed during this procedure. A total of Versed 1 mg and Fentanyl 100 mcg was administered intravenously. Moderate Sedation Time: 19 minutes. The patient's level of consciousness and vital signs were monitored continuously by radiology nursing throughout the procedure under my direct supervision. CONTRAST:  None COMPLICATIONS: None immediate. PROCEDURE: Informed written consent was obtained from the patient (via the use of a medical translator) after a discussion of the risks, benefits and alternatives to treatment. The patient was placed supine on the CT gantry and a pre procedural CT was performed re-demonstrating the known abscess/fluid collection within the left hemiabdomen/pelvis with dominant component measuring approximately 8.6 x 6.7 cm (image 62, series 3). Note is also made of persistent enhancement involving the left sided moiety of the horseshoe kidney (representative image 40, series 3). The procedure was planned. A timeout was performed prior to the initiation of the procedure. The skin overlying the left inferolateral abdomen was prepped and draped in the usual sterile fashion. The overlying soft tissues were anesthetized with 1% lidocaine with epinephrine. Appropriate trajectory was planned with the use of a 22 gauge spinal needle. An 18 gauge trocar needle was advanced into the abscess/fluid collection and a short Amplatz super stiff wire was coiled within the collection. Appropriate positioning was confirmed with a limited CT scan. The tract was serially dilated allowing placement of a 10 [place] all-purpose drainage catheter. Appropriate positioning was confirmed with a limited  postprocedural CT scan. Approximately 300 cc of bloody was aspirated. The tube was connected to a drainage bag and sutured in place. A dressing was placed. The patient tolerated the procedure well without immediate post procedural complication. IMPRESSION: 1. 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 Samples were sent to the laboratory as requested by the ordering clinical team. 2. Persistent contrast enhancement of the left-sided moiety of the horseshoe kidney, likely secondary to delayed excretion given the presence of large volume of clot and hemorrhage within the left collecting system. Electronically Signed   By: Simonne Come M.D.   On: 07/16/2016 11:50    Assessment/Plan: 35yo with pyelonephritis and urinoma s/p drain placement  1. COntinue foley catheter to straight drain 2. Change pelvic drain to straight drain since the high output may be be preventing the resolution of the renal pelvis leak 3. continue broad spectrum antibiotics   LOS: 2 days   Wilkie Aye 07/17/2016, 10:27 PM

## 2016-07-17 NOTE — Progress Notes (Signed)
PROGRESS NOTE    [name] [name]  ZOX:[id number] DOB: Apr 22, 1981 DOA: 07/15/2016 PCP: Jaclyn Shaggy, MD   Brief Narrative:  [name] [name] is a 36 y.o. 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 On 07/08/2016 patient underwent  a robotic assisted laparoscopic left dismembered pyeloplasty also had an insertion of left ureteral stent he was able to be discharged to home in good health but continued to have left scrotal pain at first mild 2 Days ago he developed pain that is constant and throbbing he also had associated low-grade temperature continued to have good urine output  But yesterday he developed severe left flank pain and hematuria. Pain is severe radiating to the testicles on the left side as well as left flank.  Regarding pertinent Chronic problems: Patient has known history of hypertension controlled with lisinopril and hydrochlorothiazide  IN ER:  Temp (24hrs), Avg:99.5 F (37.5 C), Min:98.5 F (36.9 C), Max:100.5 F (38.1 C) RR 18 sat 97% Hr 94 Bp 114/70 Lactic acid 1.58 Sodium 125 K4.6 chloride 88 bicarbonate 26 creatinine 1.38 which is up from baseline   AG 11 WBC 25.2 hemoglobin 10.8.  UA positive for white blood cells and red blood cells with bacteria and nitrites  CT showing: Diffuse hemorrhage feeling markedly dilated left renal calyces and left renal pelvis origin free fluid in the left lower quadrant free fluid in the pelvis possible urine leak mildly decreased enhancement of the left side of caution kidney worrisome for mild disruption of the blood supply.   Assessment & Plan:   Active Problems:   Hypertension   Hyperlipidemia   UPJ (ureteropelvic  junction) obstruction   UTI (urinary tract infection)   Hyponatremia   Anemia   Sepsis (HCC)   Renal hemorrhage with a urine leak resulting in sepsis -  Urology, IR and PCCM consulted - monitor serial H/H- can space out to every 12 hours - NPO  - monitor kidney function - Admit per Sepsis protocol likely source being UTI vs intra-abdominal infection - rehydrate with 44ml/kg - initiate broad spectrum antibiotics Vancomycin and Zosyn -  obtain blood cultures - lactic acid of 0.8 this am - procalcitonin of 0.74 this am    Hyperlipidemia - hold PO medications for now  Hypertension  -hold lisinopril due to AKI - hold HCTZ monitor for hypotension - blood pressures well controlled  UTI (urinary tract infection)  - urine culture pending  - continue vancomycin and zosyn  Hyponatremia - improved to 131 today - continue IVF - likely 2/2 dehydration - repeat BMP in am after patient eating diet all day  Anemia suspect acute blood loss secondary to postoperative bleeding - Type and screen ordered - H/H stable - repeat CBC in am   DVT prophylaxis:  SCD  Code Status:  FULL CODE as per patient   Family Communication:  male family member is bedside   Disposition Plan: will likely discharge back to previous environment when improved and medically stable   Consultants:   Urology  PCCM  General Surgery (pt discussed by EDP)  IR  Procedures:   CT guided placement of 10Fr drainage catheter/ Left robotic Pyeloplasty  Antimicrobials:   Vancomycin  Zosyn    Subjective: Patient  initially asleep this am but upon awaking denies pain. No events overnight noted.  Objective: Vitals:   07/17/16 1200 07/17/16 1300 07/17/16 1400 07/17/16 1500  BP: 129/80  119/69   Pulse: 98 95 92 94  Resp: (!) 22 (!) 26 (!) 26 (!) 24  Temp:      TempSrc:      SpO2: 98% 97% 97% 97%  Weight:      Height:        Intake/Output Summary (Last 24 hours) at 07/17/16 1614 Last data  filed at 07/17/16 1500  Gross per 24 hour  Intake             1670 ml  Output             3720 ml  Net            -2050 ml   Filed Weights   07/16/16 0015 07/17/16 0000  Weight: 103.1 kg (227 lb 4.7 oz) 104.1 kg (229 lb 8 oz)    Examination:  General exam: Appears calm and comfortable  Respiratory system: Clear to auscultation. Respiratory effort normal. Cardiovascular system: S1 & S2 heard, RRR. No JVD, rubs, gallops or clicks. No pedal edema. Gastrointestinal system: Abdomen is nondistended, soft and nontender. No organomegaly or masses felt. Normal bowel sounds heard.  Foley catheter in place draining pink urine Central nervous system: Alert and oriented. No focal neurological deficits. Extremities: Symmetric 5 x 5 power. Skin: No rashes, lesions or ulcers Psychiatry: Judgement and insight appear normal. Mood & affect appropriate.     Data Reviewed: I have personally reviewed following labs and imaging studies  CBC:  Recent Labs Lab 07/15/16 1818  07/16/16 1238 07/16/16 1910 07/17/16 0024 07/17/16 0717 07/17/16 1344  WBC 25.2*  < > 22.5* 18.2* 19.8* 17.8* 16.8*  NEUTROABS 22.9*  --   --   --   --   --   --   HGB 10.8*  < > 8.9* 9.0* 8.7* 8.6* 8.5*  HCT 31.0*  < > 25.7* 26.1* 24.9* 25.3* 24.9*  MCV 83.1  < > 82.6 82.6 83.0 83.2 83.8  PLT 396  < > 350 339 350 388 430*  < > = values in this interval not displayed. Basic Metabolic Panel:  Recent Labs Lab 07/15/16 1818 07/16/16 0344 07/17/16 0845  NA 125* 127* 131*  K 4.6 4.3 4.1  CL 88* 92* 97*  CO2 26 24 24   GLUCOSE 149* 138* 107*  BUN 26* 22* 21*  CREATININE 1.38* 1.24 0.96  CALCIUM 8.7* 8.3* 8.2*  MG  --  2.2  --   PHOS  --  3.6  --    GFR: Estimated Creatinine Clearance: 124.7 mL/min (by C-G formula based on SCr of 0.96 mg/dL). Liver Function Tests:  Recent Labs Lab 07/16/16 0344  AST 34  ALT 79*  ALKPHOS 265*  BILITOT 2.1*  PROT 7.5  ALBUMIN 3.0*   No results for input(s): LIPASE, AMYLASE  in the last 168 hours. No results for input(s): AMMONIA in the last 168 hours. Coagulation Profile:  Recent Labs Lab 07/16/16 0137  INR 1.10  1.16   Cardiac Enzymes: No results for input(s): CKTOTAL, CKMB, CKMBINDEX, TROPONINI in the last 168 hours. BNP (last 3 results) No results for input(s): PROBNP in the last 8760 hours. HbA1C: No results for input(s): HGBA1C in the last 72 hours. CBG: No results for input(s): GLUCAP in the last 168 hours. Lipid Profile: No results for input(s): CHOL, HDL, LDLCALC, TRIG,  CHOLHDL, LDLDIRECT in the last 72 hours. Thyroid Function Tests:  Recent Labs  07/16/16 0344  TSH 0.327*   Anemia Panel: No results for input(s): VITAMINB12, FOLATE, FERRITIN, TIBC, IRON, RETICCTPCT in the last 72 hours. Sepsis Labs:  Recent Labs Lab 07/15/16 2033 07/16/16 0137 07/16/16 0344  PROCALCITON  --  0.74  --   LATICACIDVEN 1.58 0.6 0.8    Recent Results (from the past 240 hour(s))  Urine culture     Status: None   Collection Time: 07/15/16  5:30 PM  Result Value Ref Range Status   Specimen Description URINE, CLEAN CATCH  Final   Special Requests NONE  Final   Culture NO GROWTH Performed at [facility]   Final   Report Status 07/17/2016 FINAL  Final  Blood culture (routine x 2)     Status: None (Preliminary result)   Collection Time: 07/15/16  8:34 PM  Result Value Ref Range Status   Specimen Description BLOOD RIGHT ANTECUBITAL  Final   Special Requests BOTTLES DRAWN AEROBIC AND ANAEROBIC 5CC EACH  Final   Culture   Final    NO GROWTH 1 DAY Performed at [facility]    Report Status PENDING  Incomplete  Blood culture (routine x 2)     Status: None (Preliminary result)   Collection Time: 07/15/16  8:43 PM  Result Value Ref Range Status   Specimen Description BLOOD LEFT ANTECUBITAL  Final   Special Requests BOTTLES DRAWN AEROBIC AND ANAEROBIC 5CC EACH  Final   Culture   Final    NO GROWTH 1 DAY Performed at [facility]    Report Status PENDING  Incomplete  MRSA PCR Screening     Status: None   Collection Time: 07/16/16  2:47 AM  Result Value Ref Range Status   MRSA by PCR NEGATIVE NEGATIVE Final    Comment:        The GeneXpert MRSA Assay (FDA approved for NASAL specimens only), is one component of a comprehensive MRSA colonization surveillance program. It is not intended to diagnose MRSA infection nor to guide or monitor treatment for MRSA infections.   Aerobic/Anaerobic Culture (surgical/deep wound)     Status: None (Preliminary result)   Collection Time: 07/16/16 11:13 AM  Result Value Ref Range Status   Specimen Description DRAINAGE URINOMA  Final   Special Requests Normal  Final   Gram Stain   Final    ABUNDANT WBC PRESENT, PREDOMINANTLY MONONUCLEAR NO ORGANISMS SEEN    Culture   Final    NO GROWTH 1 DAY Performed at [facility]    Report Status PENDING  Incomplete         Radiology Studies: Ct Abdomen Pelvis W Contrast  Result Date: 07/15/2016 CLINICAL DATA:  Acute onset of right flank pain and hematuria. Status post pyeloplasty. Initial encounter. EXAM: CT ABDOMEN AND PELVIS WITH CONTRAST TECHNIQUE: Multidetector CT imaging of the abdomen and pelvis was performed using the standard protocol following bolus administration of intravenous contrast. CONTRAST:  ISOVUE-300 IOPAMIDOL (ISOVUE-300) INJECTION 61% COMPARISON:  CT of the abdomen and pelvis from 05/06/2016 FINDINGS: Lower chest: The visualized lung bases are grossly clear. The visualized portions of the mediastinum are unremarkable. Hepatobiliary: The liver is unremarkable in appearance. The gallbladder is unremarkable in appearance. The common bile duct remains normal in caliber. Pancreas: The pancreas is within normal limits. Spleen: The spleen is unremarkable in appearance. Adrenals/Urinary Tract: The adrenal glands are unremarkable in appearance. There is diffuse  hemorrhage filling the markedly dilated  left renal calyces and left renal pelvis, which may arise within a pocket of fluid directly medial to the proximal left ureter. Additional hemorrhage and free fluid are seen tracking inferiorly along the left lower quadrant, with free fluid seen in the pelvis, concerning for associated urine leak. Minimal underlying soft tissue air is thought to be postoperative in nature. The left ureteral stent is noted in expected position. There is mildly decreased enhancement of the left side of the horseshoe kidney, concerning for mild disruption of blood supply to the left kidney. The right side of the horseshoe kidney is unremarkable in appearance. Stomach/Bowel: The stomach is unremarkable in appearance. The small bowel is within normal limits. The appendix is not visualized; there is no evidence for appendicitis. The colon is unremarkable in appearance. Mild stranding is noted about the descending colon, reflecting the adjacent hemorrhage and fluid tracking inferiorly from the left side of the horseshoe kidney. Vascular/Lymphatic: The abdominal aorta is unremarkable in appearance. The inferior vena cava is grossly unremarkable. No retroperitoneal lymphadenopathy is seen. No pelvic sidewall lymphadenopathy is identified. Reproductive: The bladder is mildly distended but otherwise unremarkable. The prostate remains normal in size. Other: Diffuse soft tissue air is noted along the left anterior abdominal wall, tracking into the left side of the scrotum. This appears to reflect the patient's recent laparoscopic surgery. Musculoskeletal: No acute osseous abnormalities are identified. The visualized musculature is unremarkable in appearance. IMPRESSION: 1. Diffuse hemorrhage filling the markedly dilated left renal calyces and left renal pelvis, which may arise within a pocket of fluid directly medial to the proximal left ureter. Additional hemorrhage and free fluid tracking inferiorly along the left lower quadrant, with free  fluid in the pelvis, concerning for associated urine leak. Minimal underlying soft tissue air is thought to be postoperative in nature. 2. Mildly decreased enhancement of the left side of the horseshoe kidney, concerning for mild disruption of blood supply to the left kidney. Left ureteral stent noted in expected position. 3. Diffuse soft tissue air along the left anterior abdominal wall, tracking to the left side of the scrotum. This appears reflect the patient's recent laparoscopic surgery. These results were called by telephone at the time of interpretation on 07/15/2016 at 8:29 pm to Dr. Lyndal Pulley, who verbally acknowledged these results. Electronically Signed   By: Roanna Raider M.D.   On: 07/15/2016 20:30   Ct Image Guided Drainage By Percutaneous Catheter  Result Date: 07/16/2016 INDICATION: History of horseshoe kidney, post left-sided pyloroplasty and left-sided double-J ureteral stent placement, admitted with abdominal pain and fever found to have a serpiginous fluid collection within the left pericolic gutter worrisome for a urinoma. Please perform CT-guided percutaneous drainage catheter placement. EXAM: CT IMAGE GUIDED DRAINAGE BY PERCUTANEOUS CATHETER COMPARISON:  CT abdomen pelvis - 07/15/2016; 05/06/2016 MEDICATIONS: The patient is currently admitted to the hospital and receiving intravenous antibiotics. The antibiotics were administered within an appropriate time frame prior to the initiation of the procedure. ANESTHESIA/SEDATION: Moderate (conscious) sedation was employed during this procedure. A total of Versed 1 mg and Fentanyl 100 mcg was administered intravenously. Moderate Sedation Time: 19 minutes. The patient's level of consciousness and vital signs were monitored continuously by radiology nursing throughout the procedure under my direct supervision. CONTRAST:  None COMPLICATIONS: None immediate. PROCEDURE: Informed written consent was obtained from the patient (via the use of a  medical translator) after a discussion of the risks, benefits and alternatives to treatment. The patient was placed supine  on the CT gantry and a pre procedural CT was performed re-demonstrating the known abscess/fluid collection within the left hemiabdomen/pelvis with dominant component measuring approximately 8.6 x 6.7 cm (image 62, series 3). Note is also made of persistent enhancement involving the left sided moiety of the horseshoe kidney (representative image 40, series 3). The procedure was planned. A timeout was performed prior to the initiation of the procedure. The skin overlying the left inferolateral abdomen was prepped and draped in the usual sterile fashion. The overlying soft tissues were anesthetized with 1% lidocaine with epinephrine. Appropriate trajectory was planned with the use of a 22 gauge spinal needle. An 18 gauge trocar needle was advanced into the abscess/fluid collection and a short Amplatz super stiff wire was coiled within the collection. Appropriate positioning was confirmed with a limited CT scan. The tract was serially dilated allowing placement of a 10 [place] all-purpose drainage catheter. Appropriate positioning was confirmed with a limited postprocedural CT scan. Approximately 300 cc of bloody was aspirated. The tube was connected to a drainage bag and sutured in place. A dressing was placed. The patient tolerated the procedure well without immediate post procedural complication. IMPRESSION: 1. 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 Samples were sent to the laboratory as requested by the ordering clinical team. 2. Persistent contrast enhancement of the left-sided moiety of the horseshoe kidney, likely secondary to delayed excretion given the presence of large volume of clot and hemorrhage within the left collecting system.  Electronically Signed   By: Simonne ComeJohn  Watts M.D.   On: 07/16/2016 11:50        Scheduled Meds: . piperacillin-tazobactam  3.375 g Intravenous Q8H  . sodium chloride flush  3 mL Intravenous Q12H  . vancomycin  750 mg Intravenous Q12H   Continuous Infusions:    LOS: 2 days    Time spent: 30 minutes    Katrinka BlazingAlex U Kadolph, MD [facility] Pager [phone]  If 7PM-7AM, please contact night-coverage [email or web address] Password [email or web address] 07/17/2016, 4:14 PM

## 2016-07-17 NOTE — Progress Notes (Signed)
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   60 cc of aspirated sample sent to the laboratory for analysis.    Feeling better today Output great Less pressure   Allergies: Doxycycline  Medications: Prior to Admission medications   Medication Sig Start Date End Date Taking? Authorizing Provider  atorvastatin (LIPITOR) 10 MG tablet Take 1 tablet (10 mg total) by mouth daily. 06/03/16  Yes Jaclyn Shaggy, MD  lisinopril-hydrochlorothiazide (PRINZIDE,ZESTORETIC) 10-12.5 MG tablet Take 1 tablet by mouth daily. 06/03/16  Yes Jaclyn Shaggy, MD  oxyCODONE-acetaminophen (ROXICET) 5-325 MG tablet Take 1-2 tablets by mouth every 4 (four) hours as needed for severe pain. 07/08/16  Yes Carolin Coy, MD  tamsulosin (FLOMAX) 0.4 MG CAPS capsule Take 1 capsule (0.4 mg total) by mouth daily. 07/08/16  Yes Carolin Coy, MD  cyclobenzaprine (FLEXERIL) 10 MG tablet Take 1 tablet (10 mg total) by mouth 3 (three) times daily as needed for muscle spasms. Patient not taking: Reported on 07/15/2016 06/03/16   Jaclyn Shaggy, MD     Vital Signs: BP 127/75   Pulse 89   Temp 99.5 F (37.5 C) (Oral)   Resp (!) 26   Ht 5\' 7"  (1.702 m)   Wt 229 lb 8 oz (104.1 kg)   SpO2 97%   BMI 35.94 kg/m   Physical Exam  Abdominal: Soft. Bowel sounds are normal.  Musculoskeletal: Normal range of motion.  Neurological: He is alert.  Skin: Skin is warm and dry.  Site of LLQ drain is clean and dry NT No bleeding Output bloody fluid 1.6 L yesterday 300 cc today 100 cc in bag NGTD  Low grade temp 99.5  Psychiatric: He has a normal mood and affect.  Nursing note and vitals reviewed.   Imaging: Ct  Abdomen Pelvis W Contrast  Result Date: 07/15/2016 CLINICAL DATA:  Acute onset of right flank pain and hematuria. Status post pyeloplasty. Initial encounter. EXAM: CT ABDOMEN AND PELVIS WITH CONTRAST TECHNIQUE: Multidetector CT imaging of the abdomen and pelvis was performed using the standard protocol following bolus administration of intravenous contrast. CONTRAST:  ISOVUE-300 IOPAMIDOL (ISOVUE-300) INJECTION 61% COMPARISON:  CT of the abdomen and pelvis from 05/06/2016 FINDINGS: Lower chest: The visualized lung bases are grossly clear. The visualized portions of the mediastinum are unremarkable. Hepatobiliary: The liver is unremarkable in appearance. The gallbladder is unremarkable in appearance. The common bile duct remains normal in caliber. Pancreas: The pancreas is within normal limits. Spleen: The spleen is unremarkable in appearance. Adrenals/Urinary Tract: The adrenal glands are unremarkable in appearance. There is diffuse hemorrhage filling the markedly dilated left renal calyces and left renal pelvis, which may arise within a pocket of fluid directly medial to the proximal left ureter. Additional hemorrhage and free fluid are seen tracking inferiorly along the left lower quadrant, with free fluid seen in the pelvis, concerning for associated urine leak. Minimal underlying soft tissue air is thought to be postoperative in nature. The left ureteral stent is noted in expected position. There is mildly decreased enhancement of the left side of the horseshoe kidney, concerning for mild disruption of blood supply to the left kidney. The right side of the horseshoe kidney is unremarkable  in appearance. Stomach/Bowel: The stomach is unremarkable in appearance. The small bowel is within normal limits. The appendix is not visualized; there is no evidence for appendicitis. The colon is unremarkable in appearance. Mild stranding is noted about the descending colon, reflecting the adjacent hemorrhage and  fluid tracking inferiorly from the left side of the horseshoe kidney. Vascular/Lymphatic: The abdominal aorta is unremarkable in appearance. The inferior vena cava is grossly unremarkable. No retroperitoneal lymphadenopathy is seen. No pelvic sidewall lymphadenopathy is identified. Reproductive: The bladder is mildly distended but otherwise unremarkable. The prostate remains normal in size. Other: Diffuse soft tissue air is noted along the left anterior abdominal wall, tracking into the left side of the scrotum. This appears to reflect the patient's recent laparoscopic surgery. Musculoskeletal: No acute osseous abnormalities are identified. The visualized musculature is unremarkable in appearance. IMPRESSION: 1. Diffuse hemorrhage filling the markedly dilated left renal calyces and left renal pelvis, which may arise within a pocket of fluid directly medial to the proximal left ureter. Additional hemorrhage and free fluid tracking inferiorly along the left lower quadrant, with free fluid in the pelvis, concerning for associated urine leak. Minimal underlying soft tissue air is thought to be postoperative in nature. 2. Mildly decreased enhancement of the left side of the horseshoe kidney, concerning for mild disruption of blood supply to the left kidney. Left ureteral stent noted in expected position. 3. Diffuse soft tissue air along the left anterior abdominal wall, tracking to the left side of the scrotum. This appears reflect the patient's recent laparoscopic surgery. These results were called by telephone at the time of interpretation on 07/15/2016 at 8:29 pm to Dr. Lyndal Pulley, who verbally acknowledged these results. Electronically Signed   By: Roanna Raider M.D.   On: 07/15/2016 20:30   Ct Image Guided Drainage By Percutaneous Catheter  Result Date: 07/16/2016 INDICATION: History of horseshoe kidney, post left-sided pyloroplasty and left-sided double-J ureteral stent placement, admitted with abdominal pain  and fever found to have a serpiginous fluid collection within the left pericolic gutter worrisome for a urinoma. Please perform CT-guided percutaneous drainage catheter placement. EXAM: CT IMAGE GUIDED DRAINAGE BY PERCUTANEOUS CATHETER COMPARISON:  CT abdomen pelvis - 07/15/2016; 05/06/2016 MEDICATIONS: The patient is currently admitted to the hospital and receiving intravenous antibiotics. The antibiotics were administered within an appropriate time frame prior to the initiation of the procedure. ANESTHESIA/SEDATION: Moderate (conscious) sedation was employed during this procedure. A total of Versed 1 mg and Fentanyl 100 mcg was administered intravenously. Moderate Sedation Time: 19 minutes. The patient's level of consciousness and vital signs were monitored continuously by radiology nursing throughout the procedure under my direct supervision. CONTRAST:  None COMPLICATIONS: None immediate. PROCEDURE: Informed written consent was obtained from the patient (via the use of a medical translator) after a discussion of the risks, benefits and alternatives to treatment. The patient was placed supine on the CT gantry and a pre procedural CT was performed re-demonstrating the known abscess/fluid collection within the left hemiabdomen/pelvis with dominant component measuring approximately 8.6 x 6.7 cm (image 62, series 3). Note is also made of persistent enhancement involving the left sided moiety of the horseshoe kidney (representative image 40, series 3). The procedure was planned. A timeout was performed prior to the initiation of the procedure. The skin overlying the left inferolateral abdomen was prepped and draped in the usual sterile fashion. The overlying soft tissues were anesthetized with 1% lidocaine with epinephrine. Appropriate trajectory was planned with the use of a 22 gauge spinal  needle. An 18 gauge trocar needle was advanced into the abscess/fluid collection and a short Amplatz super stiff wire was coiled  within the collection. Appropriate positioning was confirmed with a limited CT scan. The tract was serially dilated allowing placement of a 10 [place] all-purpose drainage catheter. Appropriate positioning was confirmed with a limited postprocedural CT scan. Approximately 300 cc of bloody was aspirated. The tube was connected to a drainage bag and sutured in place. A dressing was placed. The patient tolerated the procedure well without immediate post procedural complication. IMPRESSION: 1. 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 Samples were sent to the laboratory as requested by the ordering clinical team. 2. Persistent contrast enhancement of the left-sided moiety of the horseshoe kidney, likely secondary to delayed excretion given the presence of large volume of clot and hemorrhage within the left collecting system. Electronically Signed   By: Simonne ComeJohn  Watts M.D.   On: 07/16/2016 11:50    Labs:  CBC:  Recent Labs  07/16/16 1238 07/16/16 1910 07/17/16 0024 07/17/16 0717  WBC 22.5* 18.2* 19.8* 17.8*  HGB 8.9* 9.0* 8.7* 8.6*  HCT 25.7* 26.1* 24.9* 25.3*  PLT 350 339 350 388    COAGS:  Recent Labs  07/16/16 0137  INR 1.10  1.16  APTT 36  37*    BMP:  Recent Labs  07/09/16 0348 07/15/16 1818 07/16/16 0344 07/17/16 0845  NA 134* 125* 127* 131*  K 4.3 4.6 4.3 4.1  CL 99* 88* 92* 97*  CO2 26 26 24 24   GLUCOSE 131* 149* 138* 107*  BUN 12 26* 22* 21*  CALCIUM 9.0 8.7* 8.3* 8.2*  CREATININE 0.91 1.38* 1.24 0.96  GFRNONAA >60 >60 >60 >60  GFRAA >60 >60 >60 >60    LIVER FUNCTION TESTS:  Recent Labs  03/28/16 1229 07/16/16 0344  BILITOT 0.5 2.1*  AST 26 34  ALT 35 79*  ALKPHOS 61 265*  PROT 7.5 7.5  ALBUMIN 4.7 3.0*    Assessment and Plan:  LLQ abscess drain intact Will follow   Electronically Signed: Isis Costanza  A 07/17/2016, 11:26 AM   I spent a total of 15 Minutes at the the patient's bedside AND on the patient's hospital floor or unit, greater than 50% of which was counseling/coordinating care for LLQ abscess drain

## 2016-07-18 LAB — CBC
HEMATOCRIT: 25.5 % — AB (ref 39.0–52.0)
HEMOGLOBIN: 8.4 g/dL — AB (ref 13.0–17.0)
MCH: 27.9 pg (ref 26.0–34.0)
MCHC: 32.9 g/dL (ref 30.0–36.0)
MCV: 84.7 fL (ref 78.0–100.0)
Platelets: 463 10*3/uL — ABNORMAL HIGH (ref 150–400)
RBC: 3.01 MIL/uL — AB (ref 4.22–5.81)
RDW: 14.5 % (ref 11.5–15.5)
WBC: 15.7 10*3/uL — AB (ref 4.0–10.5)

## 2016-07-18 LAB — BASIC METABOLIC PANEL
ANION GAP: 9 (ref 5–15)
BUN: 20 mg/dL (ref 6–20)
CHLORIDE: 99 mmol/L — AB (ref 101–111)
CO2: 24 mmol/L (ref 22–32)
Calcium: 8.4 mg/dL — ABNORMAL LOW (ref 8.9–10.3)
Creatinine, Ser: 0.94 mg/dL (ref 0.61–1.24)
GFR calc non Af Amer: >60 mL/min (ref >60)
Glucose, Bld: 109 mg/dL — ABNORMAL HIGH (ref 65–99)
Potassium: 4.1 mmol/L (ref 3.5–5.1)
SODIUM: 132 mmol/L — AB (ref 135–145)

## 2016-07-18 LAB — CREATININE, FLUID (PLEURAL, PERITONEAL, JP DRAINAGE): Creat, Fluid: 39.3 mg/dL

## 2016-07-18 NOTE — Progress Notes (Signed)
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   2 - Left Perinephric / Pelvic Fluid Collection - new perinephric and psoas/ pelvic fluid collection by admit CT 07/15/16 and s/p IR drain 1/13. 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 JP Cr 1/15 pending.  1/15 - approx 75% volume via foley / 25% drain  3 - Fevers / Leukocytosis - fevers, bacteruria, leukocytosis on admit 1/12 c/w possible cystitis and or left pyelonephritis. UCX 1/12 NGTD (after ABX given), CX from left pelvic fluid collection 1/13 pending. Now on Vanc + Zosyn per pharmacy pending further data.   Today "[name] [name]" is stable. Pain improved. Some blood-tinged urine via foley (good sign). Fevers and leukocytosis also improving.   Objective: Vital signs in last 24 hours: Temp:  [97.7 F (36.5 C)-99.1 F (37.3 C)] 97.7 F (36.5 C) (01/15 0724) Pulse Rate:  [72-100] 79 (01/15 0400) Resp:  [18-28] 18 (01/15 0400) BP: (105-129)/(66-80) 121/69 (01/15 0724) SpO2:  [92 %-99 %] 95 % (01/15 0400) Last BM Date: 07/17/16  Intake/Output from previous day: 01/14 0701 - 01/15 0700 In: 1510 [P.O.:720; IV Piggyback:400] Out: 2725 [Urine:1600; Drains:1125] Intake/Output this shift: Total I/O In: -  Out: 115 [Urine:115]  General appearance: alert, cooperative and appears stated age Head: Normocephalic, without obvious abnormality, atraumatic Eyes: negative Nose: Nares normal. Septum midline. Mucosa normal. No drainage or sinus tenderness. Throat: lips, mucosa, and tongue normal; teeth and gums normal Back: symmetric, no curvature. ROM normal. No CVA tenderness. Resp: non-labored on room air.  Cardio: Nl rate by bedside monitor.  GI: soft, non-tender; bowel  sounds normal; no masses,  no organomegaly and LLQ drain. with bloody output that is non high output.  Male genitalia: normal Pelvic: external genitalia normal and foley in palce with tea colored urine.  Extremities: extremities normal, atraumatic, no cyanosis or edema Skin: Skin color, texture, turgor normal. No rashes or lesions Lymph nodes: Cervical, supraclavicular, and axillary nodes normal. Neurologic: Grossly normal Incision/Wound: recent port sites c/d/i.   Lab Results:   Recent Labs  07/17/16 1344 07/18/16 0342  WBC 16.8* 15.7*  HGB 8.5* 8.4*  HCT 24.9* 25.5*  PLT 430* 463*   BMET  Recent Labs  07/17/16 0845 07/18/16 0342  NA 131* 132*  K 4.1 4.1  CL 97* 99*  CO2 24 24  GLUCOSE 107* 109*  BUN 21* 20  CREATININE 0.96 0.94  CALCIUM 8.2* 8.4*   PT/INR  Recent Labs  07/16/16 0137  LABPROT 14.3  14.8  INR 1.10  1.16   ABG No results for input(s): PHART, HCO3 in the last 72 hours.  Invalid input(s): PCO2, PO2  Studies/Results: Ct Image Guided Drainage By Percutaneous Catheter  Result Date: 07/16/2016 INDICATION: History of horseshoe kidney, post left-sided pyloroplasty and left-sided double-J ureteral stent placement, admitted with abdominal pain and fever found to have a serpiginous fluid collection within the left pericolic gutter worrisome for a urinoma. Please perform CT-guided percutaneous drainage catheter placement. EXAM: CT IMAGE GUIDED DRAINAGE BY PERCUTANEOUS CATHETER COMPARISON:  CT abdomen pelvis - 07/15/2016; 05/06/2016 MEDICATIONS: The patient is currently admitted to the hospital and receiving intravenous antibiotics. The antibiotics were administered within an appropriate time frame prior to the initiation of the procedure. ANESTHESIA/SEDATION: Moderate (conscious) sedation was employed during  this procedure. A total of Versed 1 mg and Fentanyl 100 mcg was administered intravenously. Moderate Sedation Time: 19 minutes. The patient's level of  consciousness and vital signs were monitored continuously by radiology nursing throughout the procedure under my direct supervision. CONTRAST:  None COMPLICATIONS: None immediate. PROCEDURE: Informed written consent was obtained from the patient (via the use of a medical translator) after a discussion of the risks, benefits and alternatives to treatment. The patient was placed supine on the CT gantry and a pre procedural CT was performed re-demonstrating the known abscess/fluid collection within the left hemiabdomen/pelvis with dominant component measuring approximately 8.6 x 6.7 cm (image 62, series 3). Note is also made of persistent enhancement involving the left sided moiety of the horseshoe kidney (representative image 40, series 3). The procedure was planned. A timeout was performed prior to the initiation of the procedure. The skin overlying the left inferolateral abdomen was prepped and draped in the usual sterile fashion. The overlying soft tissues were anesthetized with 1% lidocaine with epinephrine. Appropriate trajectory was planned with the use of a 22 gauge spinal needle. An 18 gauge trocar needle was advanced into the abscess/fluid collection and a short Amplatz super stiff wire was coiled within the collection. Appropriate positioning was confirmed with a limited CT scan. The tract was serially dilated allowing placement of a 10 [place] all-purpose drainage catheter. Appropriate positioning was confirmed with a limited postprocedural CT scan. Approximately 300 cc of bloody was aspirated. The tube was connected to a drainage bag and sutured in place. A dressing was placed. The patient tolerated the procedure well without immediate post procedural complication. IMPRESSION: 1. 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 Samples were sent to the laboratory as  requested by the ordering clinical team. 2. Persistent contrast enhancement of the left-sided moiety of the horseshoe kidney, likely secondary to delayed excretion given the presence of large volume of clot and hemorrhage within the left collecting system. Electronically Signed   By: Simonne Come M.D.   On: 07/16/2016 11:50    Anti-infectives: Anti-infectives    Start     Dose/Rate Route Frequency Ordered Stop   07/16/16 1000  vancomycin (VANCOCIN) IVPB 750 mg/150 ml premix     750 mg 150 mL/hr over 60 Minutes Intravenous Every 12 hours 07/16/16 0053     07/16/16 0200  piperacillin-tazobactam (ZOSYN) IVPB 3.375 g     3.375 g 12.5 mL/hr over 240 Minutes Intravenous Every 8 hours 07/16/16 0013     07/15/16 1945  vancomycin (VANCOCIN) 2,000 mg in sodium chloride 0.9 % 500 mL IVPB     2,000 mg 250 mL/hr over 120 Minutes Intravenous  Once 07/15/16 1938 07/16/16 0700   07/15/16 1945  ceFEPIme (MAXIPIME) 1 g in dextrose 5 % 50 mL IVPB     1 g 100 mL/hr over 30 Minutes Intravenous  Once 07/15/16 1938 07/15/16 2123      Assessment/Plan:  1 - 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   2 - Left Perinephric / Pelvic Fluid Collection - likely due to some relative obstruction from blood / clot in renal pelvis, then subsequent extravasation. Rec keep foley and pelvic drain until pelvic drain output essentially nil, then remove foley and monitor pelvic drain output. Drain Cr today as well.  This will resolve with time and by keeping drains positioned to antegrade intraluminal flow is favored over non-luminal flow.   We will manage these drains.   3 - Fevers / Leukocytosis - likely early pyelo by relative obstruction above. Agree with current ABX pending further CX data. Appreciate medical comnagement.  I will try to arrange visit with interpreter / pt / family for this afternoon to explain current issues /  pathophysiology / and plan.   Please call me directly with questions.   [facility] CenterMANNY, Kyuss Hale 07/18/2016

## 2016-07-18 NOTE — Progress Notes (Signed)
Referring Physician(s):   Dr. Debbra Riding  Supervising Physician: Oley Balm  Patient Status:  [facility] - In-pt  Chief Complaint:  LLQ fluid collection  Subjective: Patient resting comfortably.  Wants to get out of bed. Pain improved.   Allergies: Doxycycline  Medications: Prior to Admission medications   Medication Sig Start Date End Date Taking? Authorizing Provider  atorvastatin (LIPITOR) 10 MG tablet Take 1 tablet (10 mg total) by mouth daily. 06/03/16  Yes Jaclyn Shaggy, MD  lisinopril-hydrochlorothiazide (PRINZIDE,ZESTORETIC) 10-12.5 MG tablet Take 1 tablet by mouth daily. 06/03/16  Yes Jaclyn Shaggy, MD  oxyCODONE-acetaminophen (ROXICET) 5-325 MG tablet Take 1-2 tablets by mouth every 4 (four) hours as needed for severe pain. 07/08/16  Yes Carolin Coy, MD  tamsulosin (FLOMAX) 0.4 MG CAPS capsule Take 1 capsule (0.4 mg total) by mouth daily. 07/08/16  Yes Carolin Coy, MD  cyclobenzaprine (FLEXERIL) 10 MG tablet Take 1 tablet (10 mg total) by mouth 3 (three) times daily as needed for muscle spasms. Patient not taking: Reported on 07/15/2016 06/03/16   Jaclyn Shaggy, MD     Vital Signs: BP 127/68   Pulse 81   Temp 97.7 F (36.5 C)   Resp (!) 25   Ht 5\' 7"  (1.702 m)   Wt 229 lb 8 oz (104.1 kg)   SpO2 98%   BMI 35.94 kg/m   Physical Exam  Constitutional: He appears well-developed.  Abdominal: Soft. He exhibits no distension. There is no tenderness.  Musculoskeletal: He exhibits tenderness (mild tenderness at insertion site which has improved since admission).  Drain in place with 210 mL bloody output so far today.  Dressing in place. Site c/d/i.  Note healing incisions from robotic pyeloplasty.  Nursing note and vitals reviewed.   Imaging: Ct Abdomen Pelvis W Contrast  Result Date: 07/15/2016 CLINICAL DATA:  Acute onset of right flank pain and hematuria. Status post pyeloplasty. Initial encounter. EXAM: CT ABDOMEN AND PELVIS WITH CONTRAST TECHNIQUE:  Multidetector CT imaging of the abdomen and pelvis was performed using the standard protocol following bolus administration of intravenous contrast. CONTRAST:  ISOVUE-300 IOPAMIDOL (ISOVUE-300) INJECTION 61% COMPARISON:  CT of the abdomen and pelvis from 05/06/2016 FINDINGS: Lower chest: The visualized lung bases are grossly clear. The visualized portions of the mediastinum are unremarkable. Hepatobiliary: The liver is unremarkable in appearance. The gallbladder is unremarkable in appearance. The common bile duct remains normal in caliber. Pancreas: The pancreas is within normal limits. Spleen: The spleen is unremarkable in appearance. Adrenals/Urinary Tract: The adrenal glands are unremarkable in appearance. There is diffuse hemorrhage filling the markedly dilated left renal calyces and left renal pelvis, which may arise within a pocket of fluid directly medial to the proximal left ureter. Additional hemorrhage and free fluid are seen tracking inferiorly along the left lower quadrant, with free fluid seen in the pelvis, concerning for associated urine leak. Minimal underlying soft tissue air is thought to be postoperative in nature. The left ureteral stent is noted in expected position. There is mildly decreased enhancement of the left side of the horseshoe kidney, concerning for mild disruption of blood supply to the left kidney. The right side of the horseshoe kidney is unremarkable in appearance. Stomach/Bowel: The stomach is unremarkable in appearance. The small bowel is within normal limits. The appendix is not visualized; there is no evidence for appendicitis. The colon is unremarkable in appearance. Mild stranding is noted about the descending colon, reflecting the adjacent hemorrhage and fluid tracking inferiorly from the  left side of the horseshoe kidney. Vascular/Lymphatic: The abdominal aorta is unremarkable in appearance. The inferior vena cava is grossly unremarkable. No retroperitoneal  lymphadenopathy is seen. No pelvic sidewall lymphadenopathy is identified. Reproductive: The bladder is mildly distended but otherwise unremarkable. The prostate remains normal in size. Other: Diffuse soft tissue air is noted along the left anterior abdominal wall, tracking into the left side of the scrotum. This appears to reflect the patient's recent laparoscopic surgery. Musculoskeletal: No acute osseous abnormalities are identified. The visualized musculature is unremarkable in appearance. IMPRESSION: 1. Diffuse hemorrhage filling the markedly dilated left renal calyces and left renal pelvis, which may arise within a pocket of fluid directly medial to the proximal left ureter. Additional hemorrhage and free fluid tracking inferiorly along the left lower quadrant, with free fluid in the pelvis, concerning for associated urine leak. Minimal underlying soft tissue air is thought to be postoperative in nature. 2. Mildly decreased enhancement of the left side of the horseshoe kidney, concerning for mild disruption of blood supply to the left kidney. Left ureteral stent noted in expected position. 3. Diffuse soft tissue air along the left anterior abdominal wall, tracking to the left side of the scrotum. This appears reflect the patient's recent laparoscopic surgery. These results were called by telephone at the time of interpretation on 07/15/2016 at 8:29 pm to Dr. Lyndal Pulley, who verbally acknowledged these results. Electronically Signed   By: Roanna Raider M.D.   On: 07/15/2016 20:30   Ct Image Guided Drainage By Percutaneous Catheter  Result Date: 07/16/2016 INDICATION: History of horseshoe kidney, post left-sided pyloroplasty and left-sided double-J ureteral stent placement, admitted with abdominal pain and fever found to have a serpiginous fluid collection within the left pericolic gutter worrisome for a urinoma. Please perform CT-guided percutaneous drainage catheter placement. EXAM: CT IMAGE GUIDED  DRAINAGE BY PERCUTANEOUS CATHETER COMPARISON:  CT abdomen pelvis - 07/15/2016; 05/06/2016 MEDICATIONS: The patient is currently admitted to the hospital and receiving intravenous antibiotics. The antibiotics were administered within an appropriate time frame prior to the initiation of the procedure. ANESTHESIA/SEDATION: Moderate (conscious) sedation was employed during this procedure. A total of Versed 1 mg and Fentanyl 100 mcg was administered intravenously. Moderate Sedation Time: 19 minutes. The patient's level of consciousness and vital signs were monitored continuously by radiology nursing throughout the procedure under my direct supervision. CONTRAST:  None COMPLICATIONS: None immediate. PROCEDURE: Informed written consent was obtained from the patient (via the use of a medical translator) after a discussion of the risks, benefits and alternatives to treatment. The patient was placed supine on the CT gantry and a pre procedural CT was performed re-demonstrating the known abscess/fluid collection within the left hemiabdomen/pelvis with dominant component measuring approximately 8.6 x 6.7 cm (image 62, series 3). Note is also made of persistent enhancement involving the left sided moiety of the horseshoe kidney (representative image 40, series 3). The procedure was planned. A timeout was performed prior to the initiation of the procedure. The skin overlying the left inferolateral abdomen was prepped and draped in the usual sterile fashion. The overlying soft tissues were anesthetized with 1% lidocaine with epinephrine. Appropriate trajectory was planned with the use of a 22 gauge spinal needle. An 18 gauge trocar needle was advanced into the abscess/fluid collection and a short Amplatz super stiff wire was coiled within the collection. Appropriate positioning was confirmed with a limited CT scan. The tract was serially dilated allowing placement of a 10 [place] all-purpose drainage catheter. Appropriate  positioning was  confirmed with a limited postprocedural CT scan. Approximately 300 cc of bloody was aspirated. The tube was connected to a drainage bag and sutured in place. A dressing was placed. The patient tolerated the procedure well without immediate post procedural complication. IMPRESSION: 1. 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 Samples were sent to the laboratory as requested by the ordering clinical team. 2. Persistent contrast enhancement of the left-sided moiety of the horseshoe kidney, likely secondary to delayed excretion given the presence of large volume of clot and hemorrhage within the left collecting system. Electronically Signed   By: Simonne ComeJohn  Watts M.D.   On: 07/16/2016 11:50    Labs:  CBC:  Recent Labs  07/17/16 0024 07/17/16 0717 07/17/16 1344 07/18/16 0342  WBC 19.8* 17.8* 16.8* 15.7*  HGB 8.7* 8.6* 8.5* 8.4*  HCT 24.9* 25.3* 24.9* 25.5*  PLT 350 388 430* 463*    COAGS:  Recent Labs  07/16/16 0137  INR 1.10  1.16  APTT 36  37*    BMP:  Recent Labs  07/15/16 1818 07/16/16 0344 07/17/16 0845 07/18/16 0342  NA 125* 127* 131* 132*  K 4.6 4.3 4.1 4.1  CL 88* 92* 97* 99*  CO2 26 24 24 24   GLUCOSE 149* 138* 107* 109*  BUN 26* 22* 21* 20  CALCIUM 8.7* 8.3* 8.2* 8.4*  CREATININE 1.38* 1.24 0.96 0.94  GFRNONAA >60 >60 >60 >60  GFRAA >60 >60 >60 >60    LIVER FUNCTION TESTS:  Recent Labs  03/28/16 1229 07/16/16 0344  BILITOT 0.5 2.1*  AST 26 34  ALT 35 79*  ALKPHOS 61 265*  PROT 7.5 7.5  ALBUMIN 4.7 3.0*    Assessment and Plan: LLQ abscess/urinoma s/p drain placement 1/13 by Dr. Grace IsaacWatts.  Drain intact with increased bloody output.   Continue with routine drain care.  IR to follow.   Electronically Signed: Hoyt KochKacie Sue-Ellen Matthews 07/18/2016, 4:14 PM   I spent a total of 15 Minutes at the  the patient's bedside AND on the patient's hospital floor or unit, greater than 50% of which was counseling/coordinating care for LLQ abscess drain

## 2016-07-18 NOTE — Progress Notes (Signed)
PROGRESS NOTE    [name] [name]  NWG:[id number] DOB: 04/25/1981 DOA: 07/15/2016 PCP: Jaclyn Shaggy, MD   Brief Narrative:  [name] [name] is a 36 y.o. 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 On 07/08/2016 patient underwent  a robotic assisted laparoscopic left dismembered pyeloplasty also had an insertion of left ureteral stent he was able to be discharged to home in good health but continued to have left scrotal pain at first mild 2 Days ago he developed pain that is constant and throbbing he also had associated low-grade temperature continued to have good urine output  But yesterday he developed severe left flank pain and hematuria. Pain is severe radiating to the testicles on the left side as well as left flank.  Regarding pertinent Chronic problems: Patient has known history of hypertension controlled with lisinopril and hydrochlorothiazide  IN ER:  Temp (24hrs), Avg:99.5 F (37.5 C), Min:98.5 F (36.9 C), Max:100.5 F (38.1 C) RR 18 sat 97% Hr 94 Bp 114/70 Lactic acid 1.58 Sodium 125 K4.6 chloride 88 bicarbonate 26 creatinine 1.38 which is up from baseline   AG 11 WBC 25.2 hemoglobin 10.8.  UA positive for white blood cells and red blood cells with bacteria and nitrites  CT showing: Diffuse hemorrhage feeling markedly dilated left renal calyces and left renal pelvis origin free fluid in the left lower quadrant free fluid in the pelvis possible urine leak mildly decreased enhancement of the left side of caution kidney worrisome for mild disruption of the blood supply.  Urology and interventional radiology consulted and are following. Patient underwent CT guided  catheter/drain placement on 1/13. Patient tolerated procedure well and vital signs slowly returned to normal.  H/H stabilized and Cr improved.     Assessment & Plan:   Active Problems:   Hypertension   Hyperlipidemia   UPJ (ureteropelvic junction) obstruction   UTI (urinary tract infection)   Hyponatremia   Anemia   Sepsis (HCC)   Renal hemorrhage with a urine leak resulting in sepsis -  Urology, IR and PCCM consulted - monitor serial H/H- can space out to every 12 hours - heart healthy diet- patient tolerating this well - monitor kidney function - Admit per Sepsis protocol likely source being UTI vs intra-abdominal infection - rehydrate with 57ml/kg initially- IVF d/c when patient tolerating PO - initiate broad spectrum antibiotics Vancomycin and Zosyn - Blood cultures- NG x 1 day - patient slowly improving- drain still with drainage - urinoma drainage culture: ABUNDANT WBC PRESENT, PREDOMINANTLY MONONUCLEAR NO ORGANISMS SEEN - can transfer to med surg bed today    Hyperlipidemia - restart home medications  Hypertension  -hold lisinopril due to AKI - hold HCTZ monitor for hypotension - blood pressures well controlled  UTI (urinary tract infection)  - urine culture showing NG x 1 day - continue vancomycin and zosyn  Hyponatremia - improved to 132 today - likely attributed to dehydration  Anemia suspect acute blood loss secondary to postoperative bleeding - Type and screen ordered - H/H stable - continue to monitor - urine less blood tinged in foley tubing and bag   DVT prophylaxis:  SCD  Code Status:  FULL CODE as per patient   Family Communication:  male family  member is bedside   Disposition Plan: will likely discharge back to previous environment when improved and medically stable   Consultants:   Urology  PCCM  General Surgery (pt discussed by EDP)  IR  Procedures:   CT guided placement of 10Fr drainage catheter/ Left robotic  Pyeloplasty  Antimicrobials:   Vancomycin  Zosyn    Subjective: Patient reports no pain this morning.  He is asking if he can get up and walk around.  Vital signs have been stable overnight and no acute events noted.  He tolerated diet well yesterday.  Objective: Vitals:   07/18/16 0000 07/18/16 0200 07/18/16 0400 07/18/16 0724  BP: 128/66 112/70 123/70 121/69  Pulse: 72 79 79   Resp: 19 20 18    Temp: 98.2 F (36.8 C)   97.7 F (36.5 C)  TempSrc: Oral     SpO2: 99% 96% 95%   Weight:      Height:        Intake/Output Summary (Last 24 hours) at 07/18/16 0829 Last data filed at 07/18/16 0725  Gross per 24 hour  Intake             1405 ml  Output             2840 ml  Net            -1435 ml   Filed Weights   07/16/16 0015 07/17/16 0000  Weight: 103.1 kg (227 lb 4.7 oz) 104.1 kg (229 lb 8 oz)    Examination:  General exam: Appears calm and comfortable  Respiratory system: Clear to auscultation. Respiratory effort normal. Cardiovascular system: S1 & S2 heard, RRR. No JVD, rubs, gallops or clicks. No pedal edema. Gastrointestinal system: Abdomen is nondistended, soft and nontender. No organomegaly or masses felt. Normal bowel sounds heard.  Foley catheter in place draining yellow urine with a slight pink tinge, drain with bloody drainage Central nervous system: Alert and oriented. No focal neurological deficits. Extremities: Symmetric 5 x 5 power. Skin: No rashes, lesions or ulcers Psychiatry: Judgement and insight appear normal. Mood & affect appropriate.     Data Reviewed: I have personally reviewed following labs and imaging studies  CBC:  Recent Labs Lab 07/15/16 1818  07/16/16 1910 07/17/16 0024 07/17/16 0717 07/17/16 1344 07/18/16 0342  WBC 25.2*  < > 18.2* 19.8* 17.8* 16.8* 15.7*  NEUTROABS 22.9*  --   --   --   --   --   --   HGB 10.8*  < > 9.0* 8.7* 8.6* 8.5* 8.4*  HCT 31.0*  < > 26.1* 24.9* 25.3* 24.9* 25.5*  MCV 83.1  < > 82.6 83.0 83.2 83.8  84.7  PLT 396  < > 339 350 388 430* 463*  < > = values in this interval not displayed. Basic Metabolic Panel:  Recent Labs Lab 07/15/16 1818 07/16/16 0344 07/17/16 0845 07/18/16 0342  NA 125* 127* 131* 132*  K 4.6 4.3 4.1 4.1  CL 88* 92* 97* 99*  CO2 26 24 24 24   GLUCOSE 149* 138* 107* 109*  BUN 26* 22* 21* 20  CREATININE 1.38* 1.24 0.96 0.94  CALCIUM 8.7* 8.3* 8.2* 8.4*  MG  --  2.2  --   --   PHOS  --  3.6  --   --    GFR: Estimated Creatinine Clearance: 127.3 mL/min (by C-G formula based on SCr of 0.94 mg/dL). Liver Function Tests:  Recent Labs Lab 07/16/16 0344  AST 34  ALT 79*  ALKPHOS 265*  BILITOT 2.1*  PROT 7.5  ALBUMIN 3.0*   No results for input(s): LIPASE, AMYLASE in the last 168 hours. No results for input(s): AMMONIA in the last 168 hours. Coagulation Profile:  Recent Labs Lab 07/16/16 0137  INR 1.10  1.16   Cardiac Enzymes: No results for input(s): CKTOTAL, CKMB, CKMBINDEX, TROPONINI in the last 168 hours. BNP (last 3 results) No results for input(s): PROBNP in the last 8760 hours. HbA1C: No results for input(s): HGBA1C in the last 72 hours. CBG: No results for input(s): GLUCAP in the last 168 hours. Lipid Profile: No results for input(s): CHOL, HDL, LDLCALC, TRIG, CHOLHDL, LDLDIRECT in the last 72 hours. Thyroid Function Tests:  Recent Labs  07/16/16 0344  TSH 0.327*   Anemia Panel: No results for input(s): VITAMINB12, FOLATE, FERRITIN, TIBC, IRON, RETICCTPCT in the last 72 hours. Sepsis Labs:  Recent Labs Lab 07/15/16 2033 07/16/16 0137 07/16/16 0344  PROCALCITON  --  0.74  --   LATICACIDVEN 1.58 0.6 0.8    Recent Results (from the past 240 hour(s))  Urine culture     Status: None   Collection Time: 07/15/16  5:30 PM  Result Value Ref Range Status   Specimen Description URINE, CLEAN CATCH  Final   Special Requests NONE  Final   Culture NO GROWTH [facility]   Final   Report Status 07/17/2016  FINAL  Final  Blood culture (routine x 2)     Status: None (Preliminary result)   Collection Time: 07/15/16  8:34 PM  Result Value Ref Range Status   Specimen Description BLOOD RIGHT ANTECUBITAL  Final   Special Requests BOTTLES DRAWN AEROBIC AND ANAEROBIC 5CC EACH  Final   Culture   Final    NO GROWTH 1 DAY [facility]formed at [facility]    Report Status PENDING  Incomplete  Blood culture (routine x 2)     Status: None (Preliminary result)   Collection Time: 07/15/16  8:43 PM  Result Value Ref Range Status   Specimen Description BLOOD LEFT ANTECUBITAL  Final   Special Requests BOTTLES DRAWN AEROBIC AND ANAEROBIC 5CC EACH  Final   Culture   Final    NO GROWTH 1 DAY Performed at [facility]    Report Status PENDING  Incomplete  MRSA PCR Screening     Status: None   Collection Time: 07/16/16  2:47 AM  Result Value Ref Range Status   MRSA by PCR NEGATIVE NEGATIVE Final    Comment:        The GeneXpert MRSA Assay (FDA approved for NASAL specimens only), is one component of a comprehensive MRSA colonization surveillance program. It is not intended to diagnose MRSA infection nor to guide or monitor treatment for MRSA infections.   Aerobic/Anaerobic Culture (surgical/deep wound)     Status: None (Preliminary result)   Collection Time: 07/16/16 11:13 AM  Result Value Ref Range Status   Specimen Description DRAINAGE URINOMA  Final   Special Requests Normal  Final   Gram Stain   Final    ABUNDANT WBC PRESENT, PREDOMINANTLY MONONUCLEAR NO ORGANISMS SEEN    Culture   Final    NO GROWTH 1 DAY Performed at [facility]     Report Status PENDING  Incomplete         Radiology Studies: Ct Image Guided Drainage By Percutaneous Catheter  Result Date: 07/16/2016 INDICATION: History of horseshoe kidney, post left-sided pyloroplasty and left-sided double-J ureteral stent placement, admitted  with abdominal pain and fever found to have a serpiginous fluid collection  within the left pericolic gutter worrisome for a urinoma. Please perform CT-guided percutaneous drainage catheter placement. EXAM: CT IMAGE GUIDED DRAINAGE BY PERCUTANEOUS CATHETER COMPARISON:  CT abdomen pelvis - 07/15/2016; 05/06/2016 MEDICATIONS: The patient is currently admitted to the hospital and receiving intravenous antibiotics. The antibiotics were administered within an appropriate time frame prior to the initiation of the procedure. ANESTHESIA/SEDATION: Moderate (conscious) sedation was employed during this procedure. A total of Versed 1 mg and Fentanyl 100 mcg was administered intravenously. Moderate Sedation Time: 19 minutes. The patient's level of consciousness and vital signs were monitored continuously by radiology nursing throughout the procedure under my direct supervision. CONTRAST:  None COMPLICATIONS: None immediate. PROCEDURE: Informed written consent was obtained from the patient (via the use of a medical translator) after a discussion of the risks, benefits and alternatives to treatment. The patient was placed supine on the CT gantry and a pre procedural CT was performed re-demonstrating the known abscess/fluid collection within the left hemiabdomen/pelvis with dominant component measuring approximately 8.6 x 6.7 cm (image 62, series 3). Note is also made of persistent enhancement involving the left sided moiety of the horseshoe kidney (representative image 40, series 3). The procedure was planned. A timeout was performed prior to the initiation of the procedure. The skin overlying the left inferolateral abdomen was prepped and draped in the usual sterile fashion. The overlying soft tissues were anesthetized with 1% lidocaine with epinephrine. Appropriate trajectory was planned with the use of a 22 gauge spinal needle. An 18 gauge trocar needle was advanced into the abscess/fluid collection and a short Amplatz super stiff wire was coiled within the collection. Appropriate positioning was  confirmed with a limited CT scan. The tract was serially dilated allowing placement of a 10 [place] all-purpose drainage catheter. Appropriate positioning was confirmed with a limited postprocedural CT scan. Approximately 300 cc of bloody was aspirated. The tube was connected to a drainage bag and sutured in place. A dressing was placed. The patient tolerated the procedure well without immediate post procedural complication. IMPRESSION: 1. 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 Samples were sent to the laboratory as requested by the ordering clinical team. 2. Persistent contrast enhancement of the left-sided moiety of the horseshoe kidney, likely secondary to delayed excretion given the presence of large volume of clot and hemorrhage within the left collecting system. Electronically Signed   By: Simonne Come M.D.   On: 07/16/2016 11:50        Scheduled Meds: . piperacillin-tazobactam  3.375 g Intravenous Q8H  . sodium chloride flush  3 mL Intravenous Q12H  . vancomycin  750 mg Intravenous Q12H   Continuous Infusions:    LOS: 3 days    Time spent: 30 minutes    Katrinka Blazing, MD [facility] Pager [phone]  If 7PM-7AM, please contact night-coverage [email or web address] Password [email or web address] 07/18/2016, 8:29 AM

## 2016-07-19 LAB — CBC
HEMATOCRIT: 26.6 % — AB (ref 39.0–52.0)
Hemoglobin: 9 g/dL — ABNORMAL LOW (ref 13.0–17.0)
MCH: 28.8 pg (ref 26.0–34.0)
MCHC: 33.8 g/dL (ref 30.0–36.0)
MCV: 85 fL (ref 78.0–100.0)
PLATELETS: 529 10*3/uL — AB (ref 150–400)
RBC: 3.13 MIL/uL — ABNORMAL LOW (ref 4.22–5.81)
RDW: 14.4 % (ref 11.5–15.5)
WBC: 16.4 10*3/uL — AB (ref 4.0–10.5)

## 2016-07-19 LAB — BASIC METABOLIC PANEL
ANION GAP: 10 (ref 5–15)
BUN: 19 mg/dL (ref 6–20)
CALCIUM: 8.7 mg/dL — AB (ref 8.9–10.3)
CO2: 24 mmol/L (ref 22–32)
CREATININE: 0.84 mg/dL (ref 0.61–1.24)
Chloride: 101 mmol/L (ref 101–111)
GFR calc Af Amer: >60 mL/min (ref >60)
GLUCOSE: 98 mg/dL (ref 65–99)
Potassium: 4.2 mmol/L (ref 3.5–5.1)
Sodium: 135 mmol/L (ref 135–145)

## 2016-07-19 MED ORDER — METRONIDAZOLE 500 MG PO TABS
500.0000 mg | ORAL_TABLET | Freq: Three times a day (TID) | ORAL | Status: DC
  Administered 2016-07-19 – 2016-07-23 (×12): 500 mg via ORAL
  Filled 2016-07-19 (×12): qty 1

## 2016-07-19 MED ORDER — ATORVASTATIN CALCIUM 10 MG PO TABS
10.0000 mg | ORAL_TABLET | Freq: Every day | ORAL | Status: DC
Start: 2016-07-19 — End: 2016-07-23
  Administered 2016-07-19 – 2016-07-23 (×5): 10 mg via ORAL
  Filled 2016-07-19 (×6): qty 1

## 2016-07-19 MED ORDER — CIPROFLOXACIN HCL 500 MG PO TABS
500.0000 mg | ORAL_TABLET | Freq: Two times a day (BID) | ORAL | Status: DC
  Administered 2016-07-19 – 2016-07-23 (×9): 500 mg via ORAL
  Filled 2016-07-19 (×9): qty 1

## 2016-07-19 NOTE — Progress Notes (Signed)
Patient ID: [name] [name], male   DOB: February 22, 1981, 36 y.o.   MRN: [id number]    Referring Physician(s): Rinaldo Ratel, A./Manny,T  Supervising Physician: Simonne Come  Patient Status:  [facility] - In-pt  Chief Complaint: LLQ Fluid collection with drainage placement.   Subjective: Patient denies any abdominal pain. Has mild irritation at drain site but that is expected. Denies nausea or vomiting.  Allergies: Doxycycline  Medications: Prior to Admission medications   Medication Sig Start Date End Date Taking? Authorizing Provider  atorvastatin (LIPITOR) 10 MG tablet Take 1 tablet (10 mg total) by mouth daily. 06/03/16  Yes Jaclyn Shaggy, MD  lisinopril-hydrochlorothiazide (PRINZIDE,ZESTORETIC) 10-12.5 MG tablet Take 1 tablet by mouth daily. 06/03/16  Yes Jaclyn Shaggy, MD  oxyCODONE-acetaminophen (ROXICET) 5-325 MG tablet Take 1-2 tablets by mouth every 4 (four) hours as needed for severe pain. 07/08/16  Yes Carolin Coy, MD  tamsulosin (FLOMAX) 0.4 MG CAPS capsule Take 1 capsule (0.4 mg total) by mouth daily. 07/08/16  Yes Carolin Coy, MD  cyclobenzaprine (FLEXERIL) 10 MG tablet Take 1 tablet (10 mg total) by mouth 3 (three) times daily as needed for muscle spasms. Patient not taking: Reported on 07/15/2016 06/03/16   Jaclyn Shaggy, MD     Vital Signs: BP (!) 121/54   Pulse 93   Temp 98.7 F (37.1 C) (Oral)   Resp (!) 27   Ht 5\' 7"  (1.702 m)   Wt 229 lb 8 oz (104.1 kg)   SpO2 98%   BMI 35.94 kg/m   Physical Exam Awake and alert and in no distress. Heart is RRR. Abdomen is soft with no distension or tenderness. Mild tenderness at drain site. Site is clean, dry, and intact with some blood clot around tube.   Imaging: Ct Abdomen Pelvis W Contrast  Result Date: 07/15/2016 CLINICAL DATA:  Acute onset of right flank pain and hematuria. Status post pyeloplasty. Initial encounter. EXAM: CT ABDOMEN AND PELVIS WITH CONTRAST TECHNIQUE: Multidetector CT imaging of the abdomen and pelvis was performed  using the standard protocol following bolus administration of intravenous contrast. CONTRAST:  ISOVUE-300 IOPAMIDOL (ISOVUE-300) INJECTION 61% COMPARISON:  CT of the abdomen and pelvis from 05/06/2016 FINDINGS: Lower chest: The visualized lung bases are grossly clear. The visualized portions of the mediastinum are unremarkable. Hepatobiliary: The liver is unremarkable in appearance. The gallbladder is unremarkable in appearance. The common bile duct remains normal in caliber. Pancreas: The pancreas is within normal limits. Spleen: The spleen is unremarkable in appearance. Adrenals/Urinary Tract: The adrenal glands are unremarkable in appearance. There is diffuse hemorrhage filling the markedly dilated left renal calyces and left renal pelvis, which may arise within a pocket of fluid directly medial to the proximal left ureter. Additional hemorrhage and free fluid are seen tracking inferiorly along the left lower quadrant, with free fluid seen in the pelvis, concerning for associated urine leak. Minimal underlying soft tissue air is thought to be postoperative in nature. The left ureteral stent is noted in expected position. There is mildly decreased enhancement of the left side of the horseshoe kidney, concerning for mild disruption of blood supply to the left kidney. The right side of the horseshoe kidney is unremarkable in appearance. Stomach/Bowel: The stomach is unremarkable in appearance. The small bowel is within normal limits. The appendix is not visualized; there is no evidence for appendicitis. The colon is unremarkable in appearance. Mild stranding is noted about the descending colon, reflecting the adjacent hemorrhage and fluid tracking inferiorly from the left side of  the horseshoe kidney. Vascular/Lymphatic: The abdominal aorta is unremarkable in appearance. The inferior vena cava is grossly unremarkable. No retroperitoneal lymphadenopathy is seen. No pelvic sidewall lymphadenopathy is identified.  Reproductive: The bladder is mildly distended but otherwise unremarkable. The prostate remains normal in size. Other: Diffuse soft tissue air is noted along the left anterior abdominal wall, tracking into the left side of the scrotum. This appears to reflect the patient's recent laparoscopic surgery. Musculoskeletal: No acute osseous abnormalities are identified. The visualized musculature is unremarkable in appearance. IMPRESSION: 1. Diffuse hemorrhage filling the markedly dilated left renal calyces and left renal pelvis, which may arise within a pocket of fluid directly medial to the proximal left ureter. Additional hemorrhage and free fluid tracking inferiorly along the left lower quadrant, with free fluid in the pelvis, concerning for associated urine leak. Minimal underlying soft tissue air is thought to be postoperative in nature. 2. Mildly decreased enhancement of the left side of the horseshoe kidney, concerning for mild disruption of blood supply to the left kidney. Left ureteral stent noted in expected position. 3. Diffuse soft tissue air along the left anterior abdominal wall, tracking to the left side of the scrotum. This appears reflect the patient's recent laparoscopic surgery. These results were called by telephone at the time of interpretation on 07/15/2016 at 8:29 pm to Dr. Lyndal Pulley, who verbally acknowledged these results. Electronically Signed   By: Roanna Raider M.D.   On: 07/15/2016 20:30   Ct Image Guided Drainage By Percutaneous Catheter  Result Date: 07/16/2016 INDICATION: History of horseshoe kidney, post left-sided pyloroplasty and left-sided double-J ureteral stent placement, admitted with abdominal pain and fever found to have a serpiginous fluid collection within the left pericolic gutter worrisome for a urinoma. Please perform CT-guided percutaneous drainage catheter placement. EXAM: CT IMAGE GUIDED DRAINAGE BY PERCUTANEOUS CATHETER COMPARISON:  CT abdomen pelvis - 07/15/2016;  05/06/2016 MEDICATIONS: The patient is currently admitted to the hospital and receiving intravenous antibiotics. The antibiotics were administered within an appropriate time frame prior to the initiation of the procedure. ANESTHESIA/SEDATION: Moderate (conscious) sedation was employed during this procedure. A total of Versed 1 mg and Fentanyl 100 mcg was administered intravenously. Moderate Sedation Time: 19 minutes. The patient's level of consciousness and vital signs were monitored continuously by radiology nursing throughout the procedure under my direct supervision. CONTRAST:  None COMPLICATIONS: None immediate. PROCEDURE: Informed written consent was obtained from the patient (via the use of a medical translator) after a discussion of the risks, benefits and alternatives to treatment. The patient was placed supine on the CT gantry and a pre procedural CT was performed re-demonstrating the known abscess/fluid collection within the left hemiabdomen/pelvis with dominant component measuring approximately 8.6 x 6.7 cm (image 62, series 3). Note is also made of persistent enhancement involving the left sided moiety of the horseshoe kidney (representative image 40, series 3). The procedure was planned. A timeout was performed prior to the initiation of the procedure. The skin overlying the left inferolateral abdomen was prepped and draped in the usual sterile fashion. The overlying soft tissues were anesthetized with 1% lidocaine with epinephrine. Appropriate trajectory was planned with the use of a 22 gauge spinal needle. An 18 gauge trocar needle was advanced into the abscess/fluid collection and a short Amplatz super stiff wire was coiled within the collection. Appropriate positioning was confirmed with a limited CT scan. The tract was serially dilated allowing placement of a 10 [place] all-purpose drainage catheter. Appropriate positioning was confirmed with a limited  postprocedural CT scan. Approximately 300 cc of  bloody was aspirated. The tube was connected to a drainage bag and sutured in place. A dressing was placed. The patient tolerated the procedure well without immediate post procedural complication. IMPRESSION: 1. 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 Samples were sent to the laboratory as requested by the ordering clinical team. 2. Persistent contrast enhancement of the left-sided moiety of the horseshoe kidney, likely secondary to delayed excretion given the presence of large volume of clot and hemorrhage within the left collecting system. Electronically Signed   By: Simonne ComeJohn  Watts M.D.   On: 07/16/2016 11:50    Labs:  CBC:  Recent Labs  07/17/16 0717 07/17/16 1344 07/18/16 0342 07/19/16 0849  WBC 17.8* 16.8* 15.7* 16.4*  HGB 8.6* 8.5* 8.4* 9.0*  HCT 25.3* 24.9* 25.5* 26.6*  PLT 388 430* 463* 529*    COAGS:  Recent Labs  07/16/16 0137  INR 1.10  1.16  APTT 36  37*    BMP:  Recent Labs  07/16/16 0344 07/17/16 0845 07/18/16 0342 07/19/16 0849  NA 127* 131* 132* 135  K 4.3 4.1 4.1 4.2  CL 92* 97* 99* 101  CO2 24 24 24 24   GLUCOSE 138* 107* 109* 98  BUN 22* 21* 20 19  CALCIUM 8.3* 8.2* 8.4* 8.7*  CREATININE 1.24 0.96 0.94 0.84  GFRNONAA >60 >60 >60 >60  GFRAA >60 >60 >60 >60    LIVER FUNCTION TESTS:  Recent Labs  03/28/16 1229 07/16/16 0344  BILITOT 0.5 2.1*  AST 26 34  ALT 35 79*  ALKPHOS 61 265*  PROT 7.5 7.5  ALBUMIN 4.7 3.0*    Assessment and Plan:  S/P LLQ drain for fluid collection. Drained 335 ml over last 12 hrs. Patient appears stable with no signs of site infection. Cultures are negative so far but patient still maintains a WBC count. Serum creat nl; Continue with current treatment. Radiology will continue to follow. 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 Definitive plans per  urology.  Electronically Signed: D. Jeananne RamaKevin Derrin Currey 07/19/2016, 11:15 AM   I spent a total of 15 Minutes at the the patient's bedside AND on the patient's hospital floor or unit, greater than 50% of which was counseling/coordinating care for LLQ JP drain.

## 2016-07-19 NOTE — Progress Notes (Signed)
PROGRESS NOTE    [name] Ostrand  ZOX:[id number] DOB: 05-12-1981 DOA: 07/15/2016 PCP: Jaclyn Shaggy, MD   Brief Narrative:  Burrel Legrand is a 36 y.o. 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 On 07/08/2016 patient underwent  a robotic assisted laparoscopic left dismembered pyeloplasty also had an insertion of left ureteral stent he was able to be discharged to home in good health but continued to have left scrotal pain at first mild 2 Days ago he developed pain that is constant and throbbing he also had associated low-grade temperature continued to have good urine output  But yesterday he developed severe left flank pain and hematuria. Pain is severe radiating to the testicles on the left side as well as left flank.  Regarding pertinent Chronic problems: Patient has known history of hypertension controlled with lisinopril and hydrochlorothiazide  IN ER:  Temp (24hrs), Avg:99.5 F (37.5 C), Min:98.5 F (36.9 C), Max:100.5 F (38.1 C) RR 18 sat 97% Hr 94 Bp 114/70 Lactic acid 1.58 Sodium 125 K4.6 chloride 88 bicarbonate 26 creatinine 1.38 which is up from baseline   AG 11 WBC 25.2 hemoglobin 10.8.  UA positive for white blood cells and red blood cells with bacteria and nitrites  CT showing: Diffuse hemorrhage feeling markedly dilated left renal calyces and left renal pelvis origin free fluid in the left lower quadrant free fluid in the pelvis possible urine leak mildly decreased enhancement of the left side of caution kidney worrisome for mild disruption of the blood supply.  Urology and interventional radiology consulted and are following. Patient underwent CT guided  catheter/drain placement on 1/13. Patient tolerated procedure well and vital signs slowly returned to normal.  H/H stabilized and Cr improved.     Assessment & Plan:   Active Problems:   Hypertension   Hyperlipidemia   UPJ (ureteropelvic junction) obstruction   UTI (urinary tract infection)   Hyponatremia   Anemia   Sepsis (HCC)   Renal hemorrhage with a urine leak resulting in sepsis -  Urology, IR and PCCM consulted - H/H stabilized - heart healthy diet- patient tolerating this well - Kidney function WNL - rehydrate with 68ml/kg initially- IVF d/c as patient tolerating PO - initiate broad spectrum antibiotics Vancomycin and Zosyn - Blood cultures- NG x 2 day - patient slowly improving- drain still with drainage - urinoma drainage culture: ABUNDANT WBC PRESENT, PREDOMINANTLY MONONUCLEAR NO ORGANISMS SEEN - change to PO medications today - will look to urology for length of antibiotic course    Hyperlipidemia - restart home statin  Hypertension  -hold lisinopril due to AKI - hold HCTZ monitor for hypotension - blood pressures well controlled  UTI (urinary tract infection)  - urine culture showing NG x 2 day - change vanco and zosyn to cipro and flagyl  Hyponatremia - improved to 135 today - likely attributed to dehydration  Anemia suspect acute blood loss secondary to postoperative bleeding - Type and screen ordered - H/H stable - continue to monitor - urine less blood tinged in foley tubing and bag   DVT prophylaxis:  SCD  Code Status:  FULL CODE as per patient   Family Communication:  male family member is bedside   Disposition Plan: will  likely discharge back to previous environment when improved and medically stable   Consultants:   Urology  PCCM  General Surgery (pt discussed by EDP)  IR  Procedures:   CT guided placement of 10Fr drainage catheter/ Left robotic Pyeloplasty  Antimicrobials:   Vancomycin  Zosyn     Subjective: Patient asking if he can get up and walk today.  He mentions he only has some discomfort at sight of drain.  He is asking to get up and go to restroom.  Eating well.  Blood pressure controlled.    Objective: Vitals:   07/19/16 0700 07/19/16 0800 07/19/16 0900 07/19/16 1000  BP:  (!) 121/53  (!) 121/54  Pulse: 62 67 85 93  Resp: (!) 23 (!) 22 (!) 29 (!) 27  Temp:  98.7 F (37.1 C)    TempSrc:  Oral    SpO2: 98% 95% 97% 98%  Weight:      Height:        Intake/Output Summary (Last 24 hours) at 07/19/16 1137 Last data filed at 07/19/16 1610  Gross per 24 hour  Intake              410 ml  Output             2275 ml  Net            -1865 ml   Filed Weights   07/16/16 0015 07/17/16 0000  Weight: 103.1 kg (227 lb 4.7 oz) 104.1 kg (229 lb 8 oz)    Examination:  General exam: Appears calm and comfortable  Respiratory system: Clear to auscultation. Respiratory effort normal. Cardiovascular system: S1 & S2 heard, RRR. No JVD, rubs, gallops or clicks. No pedal edema. Gastrointestinal system: Abdomen is nondistended, soft and nontender. No organomegaly or masses felt. Normal bowel sounds heard.  Foley catheter in place draining tea colored urine, drain with bloody tea colored drainage Central nervous system: Alert and oriented. No focal neurological deficits. Extremities: Symmetric 5 x 5 power. Skin: No rashes, lesions or ulcers Psychiatry: Judgement and insight appear normal. Mood & affect appropriate.     Data Reviewed: I have personally reviewed following labs and imaging studies  CBC:  Recent Labs Lab 07/15/16 1818  07/17/16 0024 07/17/16 0717 07/17/16 1344 07/18/16 0342 07/19/16 0849  WBC 25.2*  < > 19.8* 17.8* 16.8* 15.7* 16.4*  NEUTROABS 22.9*  --   --   --   --   --   --   HGB 10.8*  < > 8.7* 8.6* 8.5* 8.4* 9.0*  HCT 31.0*  < > 24.9* 25.3* 24.9* 25.5* 26.6*  MCV 83.1  < > 83.0 83.2 83.8 84.7 85.0  PLT 396  < > 350 388 430* 463* 529*  < > =  values in this interval not displayed. Basic Metabolic Panel:  Recent Labs Lab 07/15/16 1818 07/16/16 0344 07/17/16 0845 07/18/16 0342 07/19/16 0849  NA 125* 127* 131* 132* 135  K 4.6 4.3 4.1 4.1 4.2  CL 88* 92* 97* 99* 101  CO2 26 24 24 24 24   GLUCOSE 149* 138* 107* 109* 98  BUN 26* 22* 21* 20 19  CREATININE 1.38* 1.24 0.96 0.94 0.84  CALCIUM 8.7* 8.3* 8.2* 8.4* 8.7*  MG  --  2.2  --   --   --   PHOS  --  3.6  --   --   --    GFR: Estimated Creatinine Clearance: 142.5 mL/min (by C-G formula based on SCr of 0.84  mg/dL). Liver Function Tests:  Recent Labs Lab 07/16/16 0344  AST 34  ALT 79*  ALKPHOS 265*  BILITOT 2.1*  PROT 7.5  ALBUMIN 3.0*   No results for input(s): LIPASE, AMYLASE in the last 168 hours. No results for input(s): AMMONIA in the last 168 hours. Coagulation Profile:  Recent Labs Lab 07/16/16 0137  INR 1.10  1.16   Cardiac Enzymes: No results for input(s): CKTOTAL, CKMB, CKMBINDEX, TROPONINI in the last 168 hours. BNP (last 3 results) No results for input(s): PROBNP in the last 8760 hours. HbA1C: No results for input(s): HGBA1C in the last 72 hours. CBG: No results for input(s): GLUCAP in the last 168 hours. Lipid Profile: No results for input(s): CHOL, HDL, LDLCALC, TRIG, CHOLHDL, LDLDIRECT in the last 72 hours. Thyroid Function Tests: No results for input(s): TSH, T4TOTAL, FREET4, T3FREE, THYROIDAB in the last 72 hours. Anemia Panel: No results for input(s): VITAMINB12, FOLATE, FERRITIN, TIBC, IRON, RETICCTPCT in the last 72 hours. Sepsis Labs:  Recent Labs Lab 07/15/16 2033 07/16/16 0137 07/16/16 0344  PROCALCITON  --  0.74  --   LATICACIDVEN 1.58 0.6 0.8    Recent Results (from the past 240 hour(s))  Urine culture     Status: None   Collection Time: 07/15/16  5:30 PM  Result Value Ref Range Status   Specimen Description URINE, CLEAN CATCH  Final   Special Requests NONE  Final   Culture NO GROWTH Performed [facility]   Final   Report Status 07/17/2016 FINAL  Final  Blood culture (routine x 2)     Status: None (Preliminary result)   Collection Time: 07/15/16  8:34 PM  Result Value Ref Range Status   Specimen Description BLOOD RIGHT ANTECUBITAL  Final   Special Requests BOTTLES DRAWN AEROBIC AND ANAEROBIC 5CC EACH  Final   Culture   Final    NO GROWTH 2 DAYS [facility]formed [facility]    Report Status PENDING  Incomplete  Blood culture (routine x 2)     Status: None (Preliminary result)   Collection Time: 07/15/16  8:43 PM  Result Value Ref Range Status   Specimen Description BLOOD LEFT ANTECUBITAL  Final   Special Requests BOTTLES DRAWN AEROBIC AND ANAEROBIC 5CC EACH  Final   Culture   Final    NO GROWTH 2 DAYS Performed at [facility]    Report Status PENDING  Incomplete  MRSA PCR Screening     Status: None   Collection Time: 07/16/16  2:47 AM  Result Value Ref Range Status   MRSA by PCR NEGATIVE NEGATIVE Final    Comment:        The GeneXpert MRSA Assay (FDA approved for NASAL specimens only), is one component of a comprehensive MRSA colonization surveillance program. It is not intended to diagnose MRSA infection nor to guide or monitor treatment for MRSA infections.   Aerobic/Anaerobic Culture (surgical/deep wound)     Status: None (Preliminary result)   Collection Time: 07/16/16 11:13 AM  Result Value Ref Range Status   Specimen Description DRAINAGE URINOMA  Final   Special Requests Normal  Final   Gram Stain   Final    ABUNDANT WBC PRESENT, PREDOMINANTLY MONONUCLEAR NO ORGANISMS SEEN    Culture   Final    NO GROWTH 2 DAYS NO ANAEROBES ISOLATED; CULTURE IN PROGRESS FOR 5 DAYS [facility]formed [facility]     Report Status PENDING  Incomplete  Radiology Studies: No results found.      Scheduled Meds: . ciprofloxacin  500 mg Oral BID  . metroNIDAZOLE  500 mg Oral Q8H  . sodium chloride flush  3 mL Intravenous Q12H   Continuous  Infusions:    LOS: 4 days    Time spent: 30 minutes    Katrinka Blazing, MD [facility] Pager [phone]  If 7PM-7AM, please contact night-coverage [email or web address] Password [facility] 07/19/2016, 11:37 AM

## 2016-07-20 DIAGNOSIS — I10 Essential (primary) hypertension: Secondary | ICD-10-CM

## 2016-07-20 DIAGNOSIS — D62 Acute posthemorrhagic anemia: Secondary | ICD-10-CM

## 2016-07-20 DIAGNOSIS — N3001 Acute cystitis with hematuria: Secondary | ICD-10-CM

## 2016-07-20 DIAGNOSIS — E871 Hypo-osmolality and hyponatremia: Secondary | ICD-10-CM

## 2016-07-20 DIAGNOSIS — A419 Sepsis, unspecified organism: Principal | ICD-10-CM

## 2016-07-20 DIAGNOSIS — N135 Crossing vessel and stricture of ureter without hydronephrosis: Secondary | ICD-10-CM

## 2016-07-20 DIAGNOSIS — E781 Pure hyperglyceridemia: Secondary | ICD-10-CM

## 2016-07-20 LAB — COMPREHENSIVE METABOLIC PANEL
ALBUMIN: 3 g/dL — AB (ref 3.5–5.0)
ALT: 98 U/L — ABNORMAL HIGH (ref 17–63)
ANION GAP: 8 (ref 5–15)
AST: 44 U/L — AB (ref 15–41)
Alkaline Phosphatase: 345 U/L — ABNORMAL HIGH (ref 38–126)
BILIRUBIN TOTAL: 0.5 mg/dL (ref 0.3–1.2)
BUN: 19 mg/dL (ref 6–20)
CHLORIDE: 105 mmol/L (ref 101–111)
CO2: 25 mmol/L (ref 22–32)
Calcium: 8.9 mg/dL (ref 8.9–10.3)
Creatinine, Ser: 0.94 mg/dL (ref 0.61–1.24)
GFR calc Af Amer: >60 mL/min (ref >60)
GFR calc non Af Amer: >60 mL/min (ref >60)
GLUCOSE: 149 mg/dL — AB (ref 65–99)
POTASSIUM: 4 mmol/L (ref 3.5–5.1)
Sodium: 138 mmol/L (ref 135–145)
Total Protein: 7.1 g/dL (ref 6.5–8.1)

## 2016-07-20 LAB — MAGNESIUM: MAGNESIUM: 2.1 mg/dL (ref 1.7–2.4)

## 2016-07-20 LAB — CBC WITH DIFFERENTIAL/PLATELET
BASOS ABS: 0 10*3/uL (ref 0.0–0.1)
BASOS PCT: 0 %
EOS ABS: 0.6 10*3/uL (ref 0.0–0.7)
EOS PCT: 4 %
HCT: 27.2 % — ABNORMAL LOW (ref 39.0–52.0)
HEMOGLOBIN: 9 g/dL — AB (ref 13.0–17.0)
Lymphocytes Relative: 14 %
Lymphs Abs: 2.1 10*3/uL (ref 0.7–4.0)
MCH: 28.6 pg (ref 26.0–34.0)
MCHC: 33.1 g/dL (ref 30.0–36.0)
MCV: 86.3 fL (ref 78.0–100.0)
Monocytes Absolute: 0.4 10*3/uL (ref 0.1–1.0)
Monocytes Relative: 3 %
NEUTROS PCT: 79 %
Neutro Abs: 11.4 10*3/uL — ABNORMAL HIGH (ref 1.7–7.7)
PLATELETS: 555 10*3/uL — AB (ref 150–400)
RBC: 3.15 MIL/uL — AB (ref 4.22–5.81)
RDW: 14.6 % (ref 11.5–15.5)
WBC: 14.5 10*3/uL — AB (ref 4.0–10.5)

## 2016-07-20 LAB — PHOSPHORUS: PHOSPHORUS: 3.6 mg/dL (ref 2.5–4.6)

## 2016-07-20 NOTE — Progress Notes (Signed)
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 On 07/08/2016 patient underwent a robotic assisted laparoscopic left dismembered pyeloplastyalso had an insertion of left ureteral stent he was able to be discharged to home in good health but continued to have left scrotal pain at first which was mild. Approximately 2 Days prior to admission he developed pain that isconstant and throbbing he also had associated low-grade temperature continued to have good urine output But the day before admission he developed severe left flank pain and hematuria. Pain is severe radiating to the testicles on the left side as well as left flank. CT Abd/Pelvis was done and showing: Diffuse hemorrhage feeling markedly dilated left renal calyces and left renal pelvis origin free fluid in the left lower quadrant free fluid in the pelvis possible urine leak mildly decreased enhancement of the left side of caution kidney worrisome for mild disruption of the blood supply. Patient was admitted to SDU because of Sepsis and PCCM was notified. Urology and Interventional Radiology consulted and are following. Patient underwent CT guided catheter/drain placement on 1/13. Patient tolerated procedure well and vital signs slowly returned to normal.  H/H stabilized and Cr improved. He is improving and still has some serosanguinous drainage from drain.   Assessment & Plan:   Active Problems:   Hypertension   Hyperlipidemia   UPJ (ureteropelvic junction) obstruction   UTI  (urinary tract infection)   Hyponatremia   Anemia   Sepsis (HCC)   Sepsis 2/2 to UTI and Renal Hemorrhage with Urine Leak poA -U/A on 07/15/2016 showed Many Bacteria, Moderate Leukocytes, Positive Nitrite and TNTC WBC; Urine Cx showed No Growth  -Blood Cx x 2 showed NGTD at 3 Days -Sepsis Physiology resolving; Given 30 cc/kg Initially; IVF now D/C'd as patient tolerating po well.  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 Grace Isaac on 07/16/16 -JP Creatinine on 07/18/16 was 39.3 consistent with Urine Leak -Urinoma drainage Gram Stain: ABUNDANT WBC PRESENT, PREDOMINANTLY MONONUCLEAR NO ORGANISMS SEEN; Urinoma Cx showed No Growth 2 days and No Anaerobes Isolated. -Urology following and Managing  Renal hemorrhage with a urine leak resulting Hematuria and contributing to Sepsis from UTI -CT Scan of Abd/Pelvis showed Diffuse hemorrhage filling the markedly dilated left renal calyces and left renal pelvis, which may arise within a pocket of fluid directly medial to the proximal left ureter. Additional hemorrhage and free fluid tracking inferiorly along the left lower quadrant, with free fluid in the pelvis, concerning for associated urine leak. Minimal underlying soft tissue air is thought to be postoperative in nature.  Mildly decreased enhancement of the left side of the horseshoe kidney, concerning for mild disruption  of blood supply to the left kidney. Left ureteral stent noted in expected position. Diffuse soft tissue air along the  left anterior abdominal wall, tracking to the left side of the scrotum. This appears reflect the patient's recent laparoscopic surgery. -Urology, IR and PCCM consulted - Hb/Hct Stable at 9.0/26.6 - heart healthy diet- patient tolerating this well - Kidney function WNL: Last BUN/Cr was 19/0.84 -Patient slowly improving- drain still with serosanginous drainage - Urology Managing -Urinoma drainage Gram Stain: ABUNDANT WBC PRESENT, PREDOMINANTLY MONONUCLEAR NO ORGANISMS SEEN -Changed to PO medications yesterday - Ciprofloxacin 500 mg po BID and Metronidazole 500 mg q8h -Will discuss with Urology about Abx Length of Tx  Hyperlipidemia -C/w Home Statin of Atorvastatin 10 mg po Daily  Hypertension  -Hold lisinopril due to AKI -Hold HCTZ monitor for hypotension -Blood pressures well controlled -Continue to Monitor Closely  UTI (urinary tract infection)  -Urine culture showing NG x 3 day -Changed vanco and zosyn to cipro and flagyl -As above  Hyponatremia -Improved to 135 today -Likely attributed to dehydration -Repeat CMP pending  Anemiasuspect ABL 2/2 to Postoperative bleeding/Renal Hemorrhage -Type and screen ordered -CBC showed Hb/Hct going from 8.4/25.5 -> 9.0/26.6 -Continue to monitor  DVT prophylaxis: SCD's Code Status: FULL CODE Family Communication: No Family present at bedside Disposition Plan: Home once Stable from Urology [place]  Consultants:   Urology  PCCM  General Surgery (by EDP)  Interventional Radiology   Procedures: CT guided placement of 10Fr drainage catheter/ Left robotic Pyeloplasty   Antimicrobials: IV Vancomycin and Zosyn  Subjective: Seen and examined at bedside and had Interpreter [place] interpreted while Nurse and I were in the room. Patient states he has been doing good and that his drain is putting out a little bit. No Abdominal Pain or back pain and feels better than yesterday. No other concerns or complaints at this  time.   Objective: Vitals:   07/19/16 1808 07/19/16 1852 07/19/16 2219 07/20/16 0514  BP: 123/73 124/72 117/64 117/70  Pulse: (!) 109 (!) 108 96 92  Resp: (!) 26 20 20 20   Temp:  99.4 F (37.4 C) 99.5 F (37.5 C) 99.1 F (37.3 C)  TempSrc:  Oral Oral Oral  SpO2: 95% 97% 98% 97%  Weight:      Height:        Intake/Output Summary (Last 24 hours) at 07/20/16 0916 Last data filed at 07/20/16 0518  Gross per 24 hour  Intake                0 ml  Output             2050 ml  Net            -2050 ml   Filed Weights   07/16/16 0015 07/17/16 0000  Weight: 103.1 kg (227 lb 4.7 oz) 104.1 kg (229 lb 8 oz)   Examination: Physical Exam:  Constitutional: WN/WD, NAD and appears calm and comfortable laying in bed Eyes: Lids and conjunctivae normal, sclerae anicteric  ENMT: External Ears, Nose appear normal. Grossly normal hearing.  Neck: Appears normal, supple, no cervical masses, normal ROM, no appreciable thyromegaly, no visible JVD Respiratory: Clear to auscultation bilaterally, no wheezing, rales, rhonchi or crackles. Normal respiratory effort and patient is not tachypenic. No accessory muscle use.  Cardiovascular: RRR, no murmurs / rubs / gallops. S1 and S2 auscultated. No extremity edema..  Abdomen: Soft, non-tender, non-distended. No masses palpated. No appreciable hepatosplenomegaly. Bowel sounds positive.  GU: Deferred. Foley Catheter  in place draining dark brown color urine Musculoskeletal: No clubbing / cyanosis of digits/nails. No joint deformity upper and lower extremities.  Skin: No rashes, lesions, ulcers. No induration; Warm and dry. Renal Drain in place draining Serosangious fluid Neurologic: CN 2-12 grossly intact with no focal deficits. Sensation intact in all 4 Extremities. Romberg sign cerebellar reflexes not assessed.  Psychiatric: Normal judgment and insight. Alert and oriented x 3. Normal mood and appropriate affect.   Data Reviewed: I have personally reviewed  following labs and imaging studies  CBC:  Recent Labs Lab 07/15/16 1818  07/17/16 0024 07/17/16 0717 07/17/16 1344 07/18/16 0342 07/19/16 0849  WBC 25.2*  < > 19.8* 17.8* 16.8* 15.7* 16.4*  NEUTROABS 22.9*  --   --   --   --   --   --   HGB 10.8*  < > 8.7* 8.6* 8.5* 8.4* 9.0*  HCT 31.0*  < > 24.9* 25.3* 24.9* 25.5* 26.6*  MCV 83.1  < > 83.0 83.2 83.8 84.7 85.0  PLT 396  < > 350 388 430* 463* 529*  < > = values in this interval not displayed. Basic Metabolic Panel:  Recent Labs Lab 07/15/16 1818 07/16/16 0344 07/17/16 0845 07/18/16 0342 07/19/16 0849  NA 125* 127* 131* 132* 135  K 4.6 4.3 4.1 4.1 4.2  CL 88* 92* 97* 99* 101  CO2 26 24 24 24 24   GLUCOSE 149* 138* 107* 109* 98  BUN 26* 22* 21* 20 19  CREATININE 1.38* 1.24 0.96 0.94 0.84  CALCIUM 8.7* 8.3* 8.2* 8.4* 8.7*  MG  --  2.2  --   --   --   PHOS  --  3.6  --   --   --    GFR: Estimated Creatinine Clearance: 142.5 mL/min (by C-G formula based on SCr of 0.84 mg/dL). Liver Function Tests:  Recent Labs Lab 07/16/16 0344  AST 34  ALT 79*  ALKPHOS 265*  BILITOT 2.1*  PROT 7.5  ALBUMIN 3.0*   No results for input(s): LIPASE, AMYLASE in the last 168 hours. No results for input(s): AMMONIA in the last 168 hours. Coagulation Profile:  Recent Labs Lab 07/16/16 0137  INR 1.10  1.16   Cardiac Enzymes: No results for input(s): CKTOTAL, CKMB, CKMBINDEX, TROPONINI in the last 168 hours. BNP (last 3 results) No results for input(s): PROBNP in the last 8760 hours. HbA1C: No results for input(s): HGBA1C in the last 72 hours. CBG: No results for input(s): GLUCAP in the last 168 hours. Lipid Profile: No results for input(s): CHOL, HDL, LDLCALC, TRIG, CHOLHDL, LDLDIRECT in the last 72 hours. Thyroid Function Tests: No results for input(s): TSH, T4TOTAL, FREET4, T3FREE, THYROIDAB in the last 72 hours. Anemia Panel: No results for input(s): VITAMINB12, FOLATE, FERRITIN, TIBC, IRON, RETICCTPCT in the last 72  hours. Sepsis Labs:  Recent Labs Lab 07/15/16 2033 07/16/16 0137 07/16/16 0344  PROCALCITON  --  0.74  --   LATICACIDVEN 1.58 0.6 0.8    Recent Results (from the past 240 hour(s))  Urine culture     Status: None   Collection Time: 07/15/16  5:30 PM  Result Value Ref Range Status   Specimen Description URINE, CLEAN CATCH  Final   Special Requests NONE  Final   Culture NO GROWTH [facility]   Final   Report Status 07/17/2016 FINAL  Final  Blood culture (routine x 2)     Status: None (Preliminary result)   Collection Time: 07/15/16  8:34  PM  Result Value Ref Range Status   Specimen Description BLOOD RIGHT ANTECUBITAL  Final   Special Requests BOTTLES DRAWN AEROBIC AND ANAEROBIC 5CC EACH  Final   Culture   Final    NO GROWTH 3 DAYS Performed at [facility], [address] [place], [place] [address]    Report Status PENDING  Incomplete  Blood culture (routine x 2)     Status: None (Preliminary result)   Collection Time: 07/15/16  8:43 PM  Result Value Ref Range Status   Specimen Description BLOOD LEFT ANTECUBITAL  Final   Special Requests BOTTLES DRAWN AEROBIC AND ANAEROBIC 5CC EACH  Final   Culture   Final    NO GROWTH 3 DAYS Performed at [facility], [address] [place], [place] [address]    Report Status PENDING  Incomplete  MRSA PCR Screening     Status: None   Collection Time: 07/16/16  2:47 AM  Result Value Ref Range Status   MRSA by PCR NEGATIVE NEGATIVE Final    Comment:        The GeneXpert MRSA Assay (FDA approved for NASAL specimens only), is one component of a comprehensive MRSA colonization surveillance program. It is not intended to diagnose MRSA infection nor to guide or monitor treatment for MRSA infections.   Aerobic/Anaerobic Culture (surgical/deep wound)     Status: None (Preliminary result)   Collection Time: 07/16/16 11:13 AM  Result Value Ref Range Status   Specimen Description DRAINAGE URINOMA  Final    Special Requests Normal  Final   Gram Stain   Final    ABUNDANT WBC PRESENT, PREDOMINANTLY MONONUCLEAR NO ORGANISMS SEEN    Culture   Final    NO GROWTH 2 DAYS NO ANAEROBES ISOLATED; CULTURE IN PROGRESS FOR 5 DAYS Performed at [facility]    Report Status PENDING  Incomplete    Radiology Studies: No results found.  Scheduled Meds: . atorvastatin  10 mg Oral Daily  . ciprofloxacin  500 mg Oral BID  . metroNIDAZOLE  500 mg Oral Q8H  . sodium chloride flush  3 mL Intravenous Q12H   Continuous Infusions:   LOS: 5 days   Merlene Laughter, DO [facility] Pager [phone]  If 7PM-7AM, please contact night-coverage [email or web address] Password [email or web address] 07/20/2016, 9:16 AM

## 2016-07-20 NOTE — Progress Notes (Signed)
Patient ID: [name] [name], male   DOB: 12-26-1980, 36 y.o.   MRN: [id number]     Referring Physician(s):  Dr. Sebastian Acheheodore Manny  Supervising Physician: Simonne ComeWatts, John  Patient Status:  [facility]  Chief Complaint:  LLQ fluid collection s/p drain placement  Subjective: Denies pain today.  Eating lunch at time of visit.   Allergies: Doxycycline  Medications: Prior to Admission medications   Medication Sig Start Date End Date Taking? Authorizing Provider  atorvastatin (LIPITOR) 10 MG tablet Take 1 tablet (10 mg total) by mouth daily. 06/03/16  Yes Jaclyn ShaggyEnobong Amao, MD  lisinopril-hydrochlorothiazide (PRINZIDE,ZESTORETIC) 10-12.5 MG tablet Take 1 tablet by mouth daily. 06/03/16  Yes Jaclyn ShaggyEnobong Amao, MD  oxyCODONE-acetaminophen (ROXICET) 5-325 MG tablet Take 1-2 tablets by mouth every 4 (four) hours as needed for severe pain. 07/08/16  Yes Carolin Coyroy A Sukhu, MD  tamsulosin (FLOMAX) 0.4 MG CAPS capsule Take 1 capsule (0.4 mg total) by mouth daily. 07/08/16  Yes Carolin Coyroy A Sukhu, MD  cyclobenzaprine (FLEXERIL) 10 MG tablet Take 1 tablet (10 mg total) by mouth 3 (three) times daily as needed for muscle spasms. Patient not taking: Reported on 07/15/2016 06/03/16   Jaclyn ShaggyEnobong Amao, MD     Vital Signs: BP 117/70 (BP Location: Left Arm)   Pulse 92   Temp 99.1 F (37.3 C) (Oral)   Resp 20   Ht 5\' 7"  (1.702 m)   Wt 229 lb 8 oz (104.1 kg)   SpO2 97%   BMI 35.94 kg/m   Physical Exam  Constitutional: He appears well-developed. No distress.  Abdominal: Soft. He exhibits no distension. There is no tenderness.  LLQ drain in place.  Site is c/d/i.  Continued serosanguinous output.  Nontender today.   Nursing note and vitals reviewed.   Imaging: No results found.  Labs:  CBC:  Recent Labs  07/17/16 0717 07/17/16 1344 07/18/16 0342 07/19/16 0849  WBC 17.8* 16.8* 15.7* 16.4*  HGB 8.6* 8.5* 8.4* 9.0*  HCT 25.3* 24.9* 25.5* 26.6*  PLT 388 430* 463* 529*    COAGS:  Recent Labs  07/16/16 0137  INR 1.10   1.16  APTT 36  37*    BMP:  Recent Labs  07/16/16 0344 07/17/16 0845 07/18/16 0342 07/19/16 0849  NA 127* 131* 132* 135  K 4.3 4.1 4.1 4.2  CL 92* 97* 99* 101  CO2 24 24 24 24   GLUCOSE 138* 107* 109* 98  BUN 22* 21* 20 19  CALCIUM 8.3* 8.2* 8.4* 8.7*  CREATININE 1.24 0.96 0.94 0.84  GFRNONAA >60 >60 >60 >60  GFRAA >60 >60 >60 >60    LIVER FUNCTION TESTS:  Recent Labs  03/28/16 1229 07/16/16 0344  BILITOT 0.5 2.1*  AST 26 34  ALT 35 79*  ALKPHOS 61 265*  PROT 7.5 7.5  ALBUMIN 4.7 3.0*    Assessment and Plan: LLQ fluid collection/urinoma s/p drain placement 1/13 by Dr. Grace IsaacWatts.  Drain intact with continued serosanguinous output- 30 mL so far today. No growth in culture thus far, awaiting final results. WBC remains elevated.  Definitive plans per Urology.  IR to follow.   Electronically Signed: Hoyt KochKacie Sue-Ellen Matthews 07/20/2016, 11:41 AM   I spent a total of 15 Minutes at the the patient's bedside AND on the patient's hospital floor or unit, greater than 50% of which was counseling/coordinating care for LLQ fluid collection/urinoma.

## 2016-07-20 NOTE — Progress Notes (Signed)
Subjective:   1 - Left UPJ Obstruction In Horseshoe Kidney - s/p left dismemebered robotic pyeloplasty 07/08/16.   2 - Left Perinephric / Pelvic Fluid Collection - new perinephric and psoas/ pelvic fluid collection by admit CT 07/15/16 and s/p IR drain 1/13. 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 JP Cr 1/15 39 c/w some urine extrav.  1/15 - approx 75% volume via foley / 25% drain 1/16 - approx 90% volume via foley / 10% drain  3 - Fevers / Leukocytosis - fevers, bacteruria, leukocytosis on admit 1/12 c/w possible cystitis and or left pyelonephritis. UCX 1/12 negative (after ABX given), CX from left pelvic fluid collection 1/13  negative. Initially on Vanc + Zosyn per pharmacy then narrowed to Cipro / Flagyl.  Today "[name]" is stable. Now on med-surg floor, ambulatory, tollerating PO. Fever curve continues to trend down.   Objective: Vital signs in last 24 hours: Temp:  [97.7 F (36.5 C)-99.5 F (37.5 C)] 99.1 F (37.3 C) (01/17 0514) Pulse Rate:  [90-110] 92 (01/17 0514) Resp:  [20-33] 20 (01/17 0514) BP: (117-128)/(54-78) 117/70 (01/17 0514) SpO2:  [94 %-98 %] 97 % (01/17 0514) Last BM Date: 07/19/16  Intake/Output from previous day: 01/16 0701 - 01/17 0700 In: -  Out: 2050 [Urine:2050] Intake/Output this shift: No intake/output data recorded.  General appearance: alert, cooperative, appears stated age and at baseline.  Eyes: negative Nose: Nares normal. Septum midline. Mucosa normal. No drainage or sinus tenderness. Throat: lips, mucosa, and tongue normal; teeth and gums normal Neck: supple, symmetrical, trachea midline Back: symmetric, no curvature. ROM normal. No CVA tenderness. Cardio: Nl rate at present.  GI: soft, non-tender; bowel sounds normal; no  masses,  no organomegaly Male genitalia: normal, foley c/d/i with mostly yellow urine with some small debris c/w resolvign clot material.  Extremities: extremities normal, atraumatic, no cyanosis or edema Skin: Skin color, texture, turgor normal. No rashes or lesions Lymph nodes: Cervical, supraclavicular, and axillary nodes normal. Neurologic: Grossly normal Incision/Wound: LLQ drain with decreased serosanguinous output that is non-foul.   Lab Results:   Recent Labs  07/18/16 0342 07/19/16 0849  WBC 15.7* 16.4*  HGB 8.4* 9.0*  HCT 25.5* 26.6*  PLT 463* 529*   BMET  Recent Labs  07/18/16 0342 07/19/16 0849  NA 132* 135  K 4.1 4.2  CL 99* 101  CO2 24 24  GLUCOSE 109* 98  BUN 20 19  CREATININE 0.94 0.84  CALCIUM 8.4* 8.7*   PT/INR No results for input(s): LABPROT, INR in the last 72 hours. ABG No results for input(s): PHART, HCO3 in the last 72 hours.  Invalid input(s): PCO2, PO2  Studies/Results: No results found.  Anti-infectives: Anti-infectives    Start     Dose/Rate Route Frequency Ordered Stop   07/19/16 1400  metroNIDAZOLE (FLAGYL) tablet 500 mg     500 mg Oral Every 8 hours 07/19/16 0828     07/19/16 1000  ciprofloxacin (CIPRO) tablet 500 mg     500 mg Oral 2 times daily 07/19/16 0828     07/16/16 1000  vancomycin (VANCOCIN) IVPB 750 mg/150 ml premix  Status:  Discontinued     750 mg 150 mL/hr over 60 Minutes Intravenous Every 12 hours 07/16/16 0053 07/19/16 0828   07/16/16 0200  piperacillin-tazobactam (ZOSYN) IVPB 3.375 g  Status:  Discontinued     3.375 g 12.5 mL/hr over 240 Minutes Intravenous Every 8 hours 07/16/16 0013 07/19/16 0828   07/15/16 1945  vancomycin (VANCOCIN) 2,000 mg in sodium chloride 0.9 % 500 mL IVPB     2,000 mg 250 mL/hr over 120 Minutes Intravenous  Once 07/15/16 1938 07/16/16 0700   07/15/16 1945  ceFEPIme (MAXIPIME) 1 g in dextrose 5 % 50 mL IVPB     1 g 100 mL/hr over 30 Minutes Intravenous  Once 07/15/16 1938 07/15/16  2123      Assessment/Plan:  1 - 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   2 - Left Perinephric / Pelvic Fluid Collection - Improving by differential drain output (more from foley relative to pelvic drain). Stressed importance of strict I/O measurements of JP v. Foley with patient and care team.  Likely due to some relative obstruction from blood / clot in renal pelvis, then subsequent extravasation. Rec keep foley and pelvic drain until pelvic drain output essentially nil, then remove foley and monitor pelvic drain output.  We will manage these drains.   3 - Fevers / Leukocytosis - likely early pyelo by relative obstruction above. Agree with current ABX as no further CX data to guide. Appreciate medical comnagement.  Please call me directly with questions.   [facility], Laia Wiley 07/20/2016

## 2016-07-20 NOTE — Progress Notes (Signed)
Interpreter # [id number] used for morning PA.

## 2016-07-21 ENCOUNTER — Inpatient Hospital Stay (HOSPITAL_COMMUNITY): Payer: Self-pay

## 2016-07-21 LAB — COMPREHENSIVE METABOLIC PANEL
ALK PHOS: 317 U/L — AB (ref 38–126)
ALT: 84 U/L — AB (ref 17–63)
AST: 34 U/L (ref 15–41)
Albumin: 3.1 g/dL — ABNORMAL LOW (ref 3.5–5.0)
Anion gap: 9 (ref 5–15)
BILIRUBIN TOTAL: 0.6 mg/dL (ref 0.3–1.2)
BUN: 21 mg/dL — AB (ref 6–20)
CALCIUM: 9 mg/dL (ref 8.9–10.3)
CHLORIDE: 106 mmol/L (ref 101–111)
CO2: 25 mmol/L (ref 22–32)
CREATININE: 0.75 mg/dL (ref 0.61–1.24)
Glucose, Bld: 103 mg/dL — ABNORMAL HIGH (ref 65–99)
Potassium: 4 mmol/L (ref 3.5–5.1)
Sodium: 140 mmol/L (ref 135–145)
TOTAL PROTEIN: 7.1 g/dL (ref 6.5–8.1)

## 2016-07-21 LAB — CBC WITH DIFFERENTIAL/PLATELET
BASOS ABS: 0 10*3/uL (ref 0.0–0.1)
Basophils Relative: 0 %
EOS PCT: 4 %
Eosinophils Absolute: 0.6 10*3/uL (ref 0.0–0.7)
HEMATOCRIT: 27.1 % — AB (ref 39.0–52.0)
Hemoglobin: 8.9 g/dL — ABNORMAL LOW (ref 13.0–17.0)
LYMPHS ABS: 2.5 10*3/uL (ref 0.7–4.0)
LYMPHS PCT: 14 %
MCH: 28.4 pg (ref 26.0–34.0)
MCHC: 32.8 g/dL (ref 30.0–36.0)
MCV: 86.6 fL (ref 78.0–100.0)
MONO ABS: 0.9 10*3/uL (ref 0.1–1.0)
Monocytes Relative: 5 %
Neutro Abs: 13.2 10*3/uL — ABNORMAL HIGH (ref 1.7–7.7)
Neutrophils Relative %: 77 %
PLATELETS: 578 10*3/uL — AB (ref 150–400)
RBC: 3.13 MIL/uL — AB (ref 4.22–5.81)
RDW: 14.7 % (ref 11.5–15.5)
WBC: 17.2 10*3/uL — AB (ref 4.0–10.5)

## 2016-07-21 LAB — CULTURE, BLOOD (ROUTINE X 2)
CULTURE: NO GROWTH
Culture: NO GROWTH

## 2016-07-21 LAB — AEROBIC/ANAEROBIC CULTURE W GRAM STAIN (SURGICAL/DEEP WOUND): Special Requests: NORMAL

## 2016-07-21 LAB — AEROBIC/ANAEROBIC CULTURE (SURGICAL/DEEP WOUND): CULTURE: NO GROWTH

## 2016-07-21 LAB — PHOSPHORUS: PHOSPHORUS: 4.1 mg/dL (ref 2.5–4.6)

## 2016-07-21 LAB — MAGNESIUM: MAGNESIUM: 2 mg/dL (ref 1.7–2.4)

## 2016-07-21 MED ORDER — IOPAMIDOL (ISOVUE-300) INJECTION 61%
100.0000 mL | Freq: Once | INTRAVENOUS | Status: AC | PRN
  Administered 2016-07-21: 100 mL via INTRAVENOUS

## 2016-07-21 MED ORDER — IOPAMIDOL (ISOVUE-300) INJECTION 61%
INTRAVENOUS | Status: AC
  Administered 2016-07-21: 17:00:00
  Filled 2016-07-21: qty 100

## 2016-07-21 NOTE — Progress Notes (Signed)
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 On 07/08/2016 patient underwent a robotic assisted laparoscopic left dismembered pyeloplastyalso had an insertion of left ureteral stent he was able to be discharged to home in good health but continued to have left scrotal pain at first which was mild. Approximately 2 Days prior to admission he developed pain that isconstant and throbbing he also had associated low-grade temperature continued to have good urine output But the day before admission he developed severe left flank pain and hematuria. Pain is severe radiating to the testicles on the left side as well as left flank. CT Abd/Pelvis was done and showing: Diffuse hemorrhage feeling markedly dilated left renal calyces and left renal pelvis origin free fluid in the left lower quadrant free fluid in the pelvis possible urine leak mildly decreased enhancement of the left side of caution kidney worrisome for mild disruption of the blood supply. Patient was admitted to SDU because of Sepsis and PCCM was notified. Urology and Interventional Radiology consulted and are following. Patient underwent CT guided catheter/drain placement on 1/13. Patient tolerated procedure well and vital signs slowly returned to normal.  H/H stabilized and Cr improved. He is improving and still has some serosanguinous drainage from drain. Foley Catheter was now removed by Urology and repeat CT of Abdomen and Pelvis was recommended by Interventional Radiology prior to  removing drain.   Assessment & Plan:   Active Problems:   Hypertension   Hyperlipidemia   UPJ (ureteropelvic junction) obstruction   UTI (urinary tract infection)   Hyponatremia   Anemia   Sepsis (HCC)  Sepsis 2/2 to UTI and Renal Hemorrhage with Urine Leak poA -Sepsis Physiology is improving and resolving -U/A on 07/15/2016 showed Many Bacteria, Moderate Leukocytes, Positive Nitrite and TNTC WBC; Urine Cx showed No Growth  -Blood Cx x 2 showed NGTD at 4 Days -Given 30 cc/kg Initially; IVF now D/C'd as patient tolerating po well.  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 Grace IsaacWatts on 07/16/16 -JP Creatinine on 07/18/16 was 39.3 consistent with Urine Leak -Urinoma drainage Gram Stain: ABUNDANT WBC PRESENT, PREDOMINANTLY MONONUCLEAR NO ORGANISMS SEEN; Urinoma Cx showed No Growth 2 days and No Anaerobes Isolated. -Urology and IR following and Managing -Will repeat CT Scan of Abdomen and Pelvis to re-evaluate Urinoma and assess for additional Urine Leak prior to drain removal   Renal hemorrhage with a urine leak resulting Hematuria and contributing to Sepsis from UTI -CT Scan of Abd/Pelvis showed Diffuse hemorrhage filling the markedly dilated left renal calyces and left renal pelvis, which may arise within a pocket of fluid directly medial to the proximal left ureter. Additional  hemorrhage and free fluid tracking inferiorly along the left lower quadrant, with free fluid in the pelvis, concerning for associated  urine leak. Minimal underlying soft tissue air is thought to be postoperative in nature.  Mildly decreased enhancement of the left side of the horseshoe kidney, concerning for mild disruption of blood supply to the left kidney. Left ureteral stent noted in expected position. Diffuse soft tissue air along the left anterior abdominal wall, tracking to the left side of the scrotum. This appears reflect the patient's recent laparoscopic surgery. -Urology, IR and PCCM consulted - Hb/Hct Stable at 8.9/27.1 - heart healthy diet- patient tolerating this well - Kidney function WNL: Last BUN/Cr was 21/0.75 -Patient slowly improving- drain still with serosanginous drainage - Urology Managing -Urinoma drainage Gram Stain: ABUNDANT WBC PRESENT, PREDOMINANTLY MONONUCLEAR NO ORGANISMS SEEN -C/w Ciprofloxacin 500 mg po BID and Metronidazole 500 mg q8h -Will discuss with Urology about Abx Length of Tx -Repeat CT Scan of Abd/Pelvis  Hyperlipidemia -C/w Home Statin of Atorvastatin 10 mg po Daily  Hypertension  -Held Lisinopril due to AKI -Held HCTZ monitor for hypotension -Blood pressures well controlled currently with out medication -Continue to Monitor Closely  UTI (urinary tract infection)  -Urine culture showing NG x 3 day -C/w Cipro and Flagyl -As above  Hyponatremia, improved -Improved to 140 today -Likely attributed to dehydration -Repeat CMP in AM  Anemiasuspect ABL 2/2 to Postoperative bleeding/Renal Hemorrhage -Stable -Type and screen ordered -CBC showed Hb/Hct going from 8.4/25.5 -> 9.0/26.6 -> 8.9/27.1 -Continue to monitor  DVT prophylaxis: SCD's Code Status: FULL CODE Family Communication: No Family present at bedside Disposition Plan: Home once Stable from Urology Standpoint; Likely tomorrow  Consultants:   Urology  PCCM  General Surgery (by EDP)  Interventional Radiology   Procedures: CT guided placement of 10Fr drainage catheter/ Left robotic  Pyeloplasty   Antimicrobials: Anti-infectives    Start     Dose/Rate Route Frequency Ordered Stop   07/19/16 1400  metroNIDAZOLE (FLAGYL) tablet 500 mg     500 mg Oral Every 8 hours 07/19/16 0828     07/19/16 1000  ciprofloxacin (CIPRO) tablet 500 mg     500 mg Oral 2 times daily 07/19/16 0828     07/16/16 1000  vancomycin (VANCOCIN) IVPB 750 mg/150 ml premix  Status:  Discontinued     750 mg 150 mL/hr over 60 Minutes Intravenous Every 12 hours 07/16/16 0053 07/19/16 0828   07/16/16 0200  piperacillin-tazobactam (ZOSYN) IVPB 3.375 g  Status:  Discontinued     3.375 g 12.5 mL/hr over 240 Minutes Intravenous Every 8 hours 07/16/16 0013 07/19/16 0828   07/15/16 1945  vancomycin (VANCOCIN) 2,000 mg in sodium chloride 0.9 % 500 mL IVPB     2,000 mg 250 mL/hr over 120 Minutes Intravenous  Once 07/15/16 1938 07/16/16 0700   07/15/16 1945  ceFEPIme (MAXIPIME) 1 g in dextrose 5 % 50 mL IVPB     1 g 100 mL/hr over 30 Minutes Intravenous  Once 07/15/16 1938 07/15/16 2123       Subjective: Seen and examined at bedside and had Interpreter Alexis [phone] interpreted while Nurse and I were in the room. Patient had foley removed this AM and states he has not yet urinated as foley was just removed. No complaints and states no Abdominal Pain or N/V. Has been ambulating the halls.  Objective: Vitals:   07/20/16 1425 07/20/16 1941 07/21/16 0600 07/21/16 1308  BP: 121/71 123/67 118/61 111/62  Pulse: 95 98 79 78  Resp: 20 18 16 18   Temp: 99 F (37.2 C) 98.7 F (37.1 C) 97.7 F (36.5 C) 98.4 F (36.9 C)  TempSrc: Oral Oral Oral Oral  SpO2: 98% 97% 95% 97%  Weight:      Height:        Intake/Output Summary (Last 24 hours) at 07/21/16 1346 Last data filed at 07/21/16 1300  Gross per 24 hour  Intake              560 ml  Output             2645 ml  Net            -2085 ml   Filed Weights   07/16/16 0015 07/17/16 0000  Weight: 103.1 kg (227 lb 4.7 oz) 104.1 kg (229 lb 8 oz)    Examination: Physical Exam:  Constitutional: WN/WD, NAD and appears calm and comfortable sitting in chair bedside Eyes: Lids and conjunctivae normal, sclerae anicteric  ENMT: External Ears, Nose appear normal. Grossly normal hearing.  Neck: Appears normal, supple, no cervical masses, normal ROM, no appreciable thyromegaly, no visible JVD Respiratory: Clear to auscultation bilaterally, no wheezing, rales, rhonchi or crackles. Normal respiratory effort and patient is not tachypenic. No accessory muscle use.  Cardiovascular: RRR, no murmurs / rubs / gallops. S1 and S2 auscultated. No extremity edema..  Abdomen: Soft, non-tender, non-distended. No masses palpated. No appreciable hepatosplenomegaly. Bowel sounds positive x4.  GU: Deferred.  Musculoskeletal: No clubbing / cyanosis of digits/nails. No joint deformity upper and lower extremities.  Skin: No rashes, lesions, ulcers. No induration; Warm and dry. Renal Drain in place draining some Serosangious fluid Neurologic: CN 2-12 grossly intact with no focal deficits. Sensation intact in all 4 Extremities. Romberg sign cerebellar reflexes not assessed.  Psychiatric: Normal judgment and insight. Alert and oriented x 3. Normal mood and appropriate affect.   Data Reviewed: I have personally reviewed following labs and imaging studies  CBC:  Recent Labs Lab 07/15/16 1818  07/17/16 1344 07/18/16 0342 07/19/16 0849 07/20/16 1349 07/21/16 0518  WBC 25.2*  < > 16.8* 15.7* 16.4* 14.5* 17.2*  NEUTROABS 22.9*  --   --   --   --  11.4* 13.2*  HGB 10.8*  < > 8.5* 8.4* 9.0* 9.0* 8.9*  HCT 31.0*  < > 24.9* 25.5* 26.6* 27.2* 27.1*  MCV 83.1  < > 83.8 84.7 85.0 86.3 86.6  PLT 396  < > 430* 463* 529* 555* 578*  < > = values in this interval not displayed. Basic Metabolic Panel:  Recent Labs Lab 07/16/16 0344 07/17/16 0845 07/18/16 0342 07/19/16 0849 07/20/16 1349 07/21/16 0518  NA 127* 131* 132* 135 138 140  K 4.3 4.1 4.1 4.2 4.0 4.0  CL  92* 97* 99* 101 105 106  CO2 24 24 24 24 25 25   GLUCOSE 138* 107* 109* 98 149* 103*  BUN 22* 21* 20 19 19  21*  CREATININE 1.24 0.96 0.94 0.84 0.94 0.75  CALCIUM 8.3* 8.2* 8.4* 8.7* 8.9 9.0  MG 2.2  --   --   --  2.1 2.0  PHOS 3.6  --   --   --  3.6 4.1   GFR: Estimated Creatinine Clearance: 149.6 mL/min (by C-G formula based on SCr of 0.75 mg/dL). Liver Function Tests:  Recent Labs Lab 07/16/16 0344 07/20/16 1349 07/21/16 0518  AST 34 44* 34  ALT 79* 98* 84*  ALKPHOS 265* 345* 317*  BILITOT 2.1* 0.5 0.6  PROT 7.5  7.1 7.1  ALBUMIN 3.0* 3.0* 3.1*   No results for input(s): LIPASE, AMYLASE in the last 168 hours. No results for input(s): AMMONIA in the last 168 hours. Coagulation Profile:  Recent Labs Lab 07/16/16 0137  INR 1.10  1.16   Cardiac Enzymes: No results for input(s): CKTOTAL, CKMB, CKMBINDEX, TROPONINI in the last 168 hours. BNP (last 3 results) No results for input(s): PROBNP in the last 8760 hours. HbA1C: No results for input(s): HGBA1C in the last 72 hours. CBG: No results for input(s): GLUCAP in the last 168 hours. Lipid Profile: No results for input(s): CHOL, HDL, LDLCALC, TRIG, CHOLHDL, LDLDIRECT in the last 72 hours. Thyroid Function Tests: No results for input(s): TSH, T4TOTAL, FREET4, T3FREE, THYROIDAB in the last 72 hours. Anemia Panel: No results for input(s): VITAMINB12, FOLATE, FERRITIN, TIBC, IRON, RETICCTPCT in the last 72 hours. Sepsis Labs:  Recent Labs Lab 07/15/16 2033 07/16/16 0137 07/16/16 0344  PROCALCITON  --  0.74  --   LATICACIDVEN 1.58 0.6 0.8    Recent Results (from the past 240 hour(s))  Urine culture     Status: None   Collection Time: 07/15/16  5:30 PM  Result Value Ref Range Status   Specimen Description URINE, CLEAN CATCH  Final   Special Requests NONE  Final   Culture NO GROWTH Performed at [facility]   Final   Report Status 07/17/2016 FINAL  Final  Blood culture (routine x 2)     Status: None  (Preliminary result)   Collection Time: 07/15/16  8:34 PM  Result Value Ref Range Status   Specimen Description BLOOD RIGHT ANTECUBITAL  Final   Special Requests BOTTLES DRAWN AEROBIC AND ANAEROBIC 5CC EACH  Final   Culture   Final    NO GROWTH 4 DAYS Performed at [facility], [address] [place], [place] [address]    Report Status PENDING  Incomplete  Blood culture (routine x 2)     Status: None (Preliminary result)   Collection Time: 07/15/16  8:43 PM  Result Value Ref Range Status   Specimen Description BLOOD LEFT ANTECUBITAL  Final   Special Requests BOTTLES DRAWN AEROBIC AND ANAEROBIC 5CC EACH  Final   Culture   Final    NO GROWTH 4 DAYS Performed at [facility], [address], [place], [place] [address]    Report Status PENDING  Incomplete  MRSA PCR Screening     Status: None   Collection Time: 07/16/16  2:47 AM  Result Value Ref Range Status   MRSA by PCR NEGATIVE NEGATIVE Final    Comment:        The GeneXpert MRSA Assay (FDA approved for NASAL specimens only), is one component of a comprehensive MRSA colonization surveillance program. It is not intended to diagnose MRSA infection nor to guide or monitor treatment for MRSA infections.   Aerobic/Anaerobic Culture (surgical/deep wound)     Status: None   Collection Time: 07/16/16 11:13 AM  Result Value Ref Range Status   Specimen Description DRAINAGE URINOMA  Final   Special Requests Normal  Final   Gram Stain   Final    ABUNDANT WBC PRESENT, PREDOMINANTLY MONONUCLEAR NO ORGANISMS SEEN    Culture   Final    No growth aerobically or anaerobically. Performed at [facility], [address], [place], [place] [address]    Report Status 07/21/2016 FINAL  Final    Radiology Studies: No results found.  Scheduled Meds: . atorvastatin  10 mg Oral Daily  . ciprofloxacin  500 mg Oral BID  . metroNIDAZOLE  500 mg Oral Q8H  . sodium chloride flush  3 mL Intravenous Q12H   Continuous  Infusions:   LOS: 6 days   Merlene Laughter, DO [facility] Pager [phone]  If 7PM-7AM, please contact night-coverage [email or web address] Password [email or web address] 07/21/2016, 1:46 PM

## 2016-07-21 NOTE — Progress Notes (Signed)
    Referring Physician(s): Manny,T  Supervising Physician: Simonne ComeWatts, John  [facility]  Chief Complaint: LLQ fluid collection/urinoma   Subjective:  Pt doing ok; denies sig abd pain,N/V; has ambulated; tol diet ok  Allergies: Doxycycline  Medications: Prior to Admission medications   Medication Sig Start Date End Date Taking? Authorizing Provider  atorvastatin (LIPITOR) 10 MG tablet Take 1 tablet (10 mg total) by mouth daily. 06/03/16  Yes Jaclyn ShaggyEnobong Amao, MD  lisinopril-hydrochlorothiazide (PRINZIDE,ZESTORETIC) 10-12.5 MG tablet Take 1 tablet by mouth daily. 06/03/16  Yes Jaclyn ShaggyEnobong Amao, MD  oxyCODONE-acetaminophen (ROXICET) 5-325 MG tablet Take 1-2 tablets by mouth every 4 (four) hours as needed for severe pain. 07/08/16  Yes Carolin Coyroy A Sukhu, MD  tamsulosin (FLOMAX) 0.4 MG CAPS capsule Take 1 capsule (0.4 mg total) by mouth daily. 07/08/16  Yes Carolin Coyroy A Sukhu, MD  cyclobenzaprine (FLEXERIL) 10 MG tablet Take 1 tablet (10 mg total) by mouth 3 (three) times daily as needed for muscle spasms. Patient not taking: Reported on 07/15/2016 06/03/16   Jaclyn ShaggyEnobong Amao, MD     Vital Signs: BP 118/61 (BP Location: Right Arm)   Pulse 79   Temp 97.7 F (36.5 C) (Oral)   Resp 16   Ht 5\' 7"  (1.702 m)   Wt 229 lb 8 oz (104.1 kg)   SpO2 95%   BMI 35.94 kg/m   Physical Exam LLQ drain intact, insertion site ok; output 40 cc blood-tinged fluid/urine  Imaging: No results found.  Labs:  CBC:  Recent Labs  07/18/16 0342 07/19/16 0849 07/20/16 1349 07/21/16 0518  WBC 15.7* 16.4* 14.5* 17.2*  HGB 8.4* 9.0* 9.0* 8.9*  HCT 25.5* 26.6* 27.2* 27.1*  PLT 463* 529* 555* 578*    COAGS:  Recent Labs  07/16/16 0137  INR 1.10  1.16  APTT 36  37*    BMP:  Recent Labs  07/18/16 0342 07/19/16 0849 07/20/16 1349 07/21/16 0518  NA 132* 135 138 140  K 4.1 4.2 4.0 4.0  CL 99* 101 105 106  CO2 24 24 25 25   GLUCOSE 109* 98 149* 103*  BUN 20 19 19  21*  CALCIUM 8.4* 8.7* 8.9  9.0  CREATININE 0.94 0.84 0.94 0.75  GFRNONAA >60 >60 >60 >60  GFRAA >60 >60 >60 >60    LIVER FUNCTION TESTS:  Recent Labs  03/28/16 1229 07/16/16 0344 07/20/16 1349 07/21/16 0518  BILITOT 0.5 2.1* 0.5 0.6  AST 26 34 44* 34  ALT 35 79* 98* 84*  ALKPHOS 61 265* 345* 317*  PROT 7.5 7.5 7.1 7.1  ALBUMIN 4.7 3.0* 3.0* 3.1*    Assessment and Plan: S/p drainage of LLQ fluid collection/urinoma 1/13; foley cath removed today; AF; WBC 17.2(14.5), hgb 8.9 (9.0), creat .75; cx's neg; rec follow up CT before consideration of drain removal; other plans as per urology/IM   Electronically Signed: D. Jeananne RamaKevin Marline Morace 07/21/2016, 9:44 AM   I spent a total of 15 minutes at the the patient's bedside AND on the patient's hospital floor or unit, greater than 50% of which was counseling/coordinating care for left pelvic fluid collection    Patient ID: [name] [name], male   DOB: 14-Jul-1980, 36 y.o.   MRN: [id number]

## 2016-07-21 NOTE — Progress Notes (Signed)
Spanish interpreter [phone] used with Dr Marland McalpineSheikh during morning rounds.

## 2016-07-21 NOTE — Progress Notes (Signed)
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   2 - Left Perinephric / Pelvic Fluid Collection - new perinephric and psoas/ pelvic fluid collection by admit CT 07/15/16 and s/p IR drain 1/13. 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 JP Cr 1/15 39 c/w some urine extrav.  1/15 - approx 75% volume via foley / 25% drain 1/16 - approx 90% volume via foley / 10% drain 1/17 - 98% volume via foley / 2% drain 1/18 - Foley removed.   3 - Fevers / Leukocytosis - fevers, bacteruria, leukocytosis on admit 1/12 c/w possible cystitis and or left pyelonephritis. UCX 1/12 negative (after ABX given), CX from left pelvic fluid collection 1/13  negative. Initially on Vanc + Zosyn per pharmacy then narrowed to Cipro / Flagyl.  Today "[name]" is w/o complaints. No high grade fevers. Very scant drain output past 24 hrs.   Objective: Vital signs in last 24 hours: Temp:  [97.7 F (36.5 C)-99 F (37.2 C)] 97.7 F (36.5 C) (01/18 0600) Pulse Rate:  [79-98] 79 (01/18 0600) Resp:  [16-20] 16 (01/18 0600) BP: (118-123)/(61-71) 118/61 (01/18 0600) SpO2:  [95 %-98 %] 95 % (01/18 0600) Last BM Date: 07/19/16  Intake/Output from previous day: 01/17 0701 - 01/18 0700 In: 740 [P.O.:720] Out: 2590 [Urine:2550; Drains:40] Intake/Output this shift: No intake/output data recorded.  General appearance: alert, cooperative and appears stated age Eyes: negative Nose: Nares normal. Septum midline. Mucosa normal. No drainage or sinus tenderness. Throat: lips, mucosa, and tongue normal; teeth and gums normal Neck: supple, symmetrical, trachea midline Back: symmetric, no curvature. ROM normal. No CVA tenderness. Resp: non-labored on room air.   Cardio: Nl rate GI: soft, non-tender; bowel sounds normal; no masses,  no organomegaly Male genitalia: normal, foley in palce with medium yellow urine, no clots/ debris. Removed.  Extremities: extremities normal, atraumatic, no cyanosis or edema Pulses: 2+ and symmetric Skin: Skin color, texture, turgor normal. No rashes or lesions Neurologic: Grossly normal Incision/Wound: recent surgical sites c/d/i. LLQ drain with scan serosanguinous output that is non-foul.   Lab Results:   Recent Labs  07/20/16 1349 07/21/16 0518  WBC 14.5* 17.2*  HGB 9.0* 8.9*  HCT 27.2* 27.1*  PLT 555* 578*   BMET  Recent Labs  07/20/16 1349 07/21/16 0518  NA 138 140  K 4.0 4.0  CL 105 106  CO2 25 25  GLUCOSE 149* 103*  BUN 19 21*  CREATININE 0.94 0.75  CALCIUM 8.9 9.0   PT/INR No results for input(s): LABPROT, INR in the last 72 hours. ABG No results for input(s): PHART, HCO3 in the last 72 hours.  Invalid input(s): PCO2, PO2  Studies/Results: No results found.  Anti-infectives: Anti-infectives    Start     Dose/Rate Route Frequency Ordered Stop   07/19/16 1400  metroNIDAZOLE (FLAGYL) tablet 500 mg     500 mg Oral Every 8 hours 07/19/16 0828     07/19/16 1000  ciprofloxacin (CIPRO) tablet 500 mg     500 mg Oral 2 times daily 07/19/16 0828     07/16/16 1000  vancomycin (VANCOCIN) IVPB 750 mg/150 ml premix  Status:  Discontinued     750 mg 150 mL/hr over 60 Minutes Intravenous Every 12 hours 07/16/16 0053 07/19/16 [id number]  07/16/16 0200  piperacillin-tazobactam (ZOSYN) IVPB 3.375 g  Status:  Discontinued     3.375 g 12.5 mL/hr over 240 Minutes Intravenous Every 8 hours 07/16/16 0013 07/19/16 0828   07/15/16 1945  vancomycin (VANCOCIN) 2,000 mg in sodium chloride 0.9 % 500 mL IVPB     2,000 mg 250 mL/hr over 120 Minutes Intravenous  Once 07/15/16 1938 07/16/16 0700   07/15/16 1945  ceFEPIme (MAXIPIME) 1 g in dextrose 5 % 50 mL IVPB     1 g 100 mL/hr over 30 Minutes Intravenous  Once  07/15/16 1938 07/15/16 2123      Assessment/Plan:  1 - 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   2 - Left Perinephric / Pelvic Fluid Collection - Likely due to relative obstruction from blood / clot in renal pelvis, then subsequent extravasation.  As drain output now essentially nil, foley removed today. Will watch drain output to make sure does not increase (if so, will replace foley).    We will manage these drains.   3 - Fevers / Leukocytosis - likely early pyelo by relative obstruction above. Agree with current ABX as no further CX data to guide until completely afebrile at least 24 hrs.   Appreciate medical comnagement.  Please call me directly with questions.   [facility], Tinsley Lomas 07/21/2016

## 2016-07-22 LAB — COMPREHENSIVE METABOLIC PANEL
ALK PHOS: 248 U/L — AB (ref 38–126)
ALT: 68 U/L — ABNORMAL HIGH (ref 17–63)
AST: 27 U/L (ref 15–41)
Albumin: 3 g/dL — ABNORMAL LOW (ref 3.5–5.0)
Anion gap: 8 (ref 5–15)
BUN: 17 mg/dL (ref 6–20)
CALCIUM: 8.8 mg/dL — AB (ref 8.9–10.3)
CO2: 24 mmol/L (ref 22–32)
CREATININE: 0.83 mg/dL (ref 0.61–1.24)
Chloride: 107 mmol/L (ref 101–111)
GFR calc Af Amer: >60 mL/min (ref >60)
GFR calc non Af Amer: >60 mL/min (ref >60)
Glucose, Bld: 101 mg/dL — ABNORMAL HIGH (ref 65–99)
POTASSIUM: 3.9 mmol/L (ref 3.5–5.1)
Sodium: 139 mmol/L (ref 135–145)
TOTAL PROTEIN: 6.9 g/dL (ref 6.5–8.1)
Total Bilirubin: 0.5 mg/dL (ref 0.3–1.2)

## 2016-07-22 LAB — CBC WITH DIFFERENTIAL/PLATELET
BASOS ABS: 0 10*3/uL (ref 0.0–0.1)
Basophils Relative: 0 %
Eosinophils Absolute: 0.7 10*3/uL (ref 0.0–0.7)
Eosinophils Relative: 5 %
HCT: 27.1 % — ABNORMAL LOW (ref 39.0–52.0)
HEMOGLOBIN: 8.9 g/dL — AB (ref 13.0–17.0)
Lymphocytes Relative: 18 %
Lymphs Abs: 2.6 10*3/uL (ref 0.7–4.0)
MCH: 28.6 pg (ref 26.0–34.0)
MCHC: 32.8 g/dL (ref 30.0–36.0)
MCV: 87.1 fL (ref 78.0–100.0)
MONO ABS: 0.9 10*3/uL (ref 0.1–1.0)
MONOS PCT: 6 %
NEUTROS ABS: 10.3 10*3/uL — AB (ref 1.7–7.7)
Neutrophils Relative %: 71 %
Platelets: 594 10*3/uL — ABNORMAL HIGH (ref 150–400)
RBC: 3.11 MIL/uL — ABNORMAL LOW (ref 4.22–5.81)
RDW: 15 % (ref 11.5–15.5)
WBC: 14.5 10*3/uL — ABNORMAL HIGH (ref 4.0–10.5)

## 2016-07-22 LAB — MAGNESIUM: MAGNESIUM: 1.9 mg/dL (ref 1.7–2.4)

## 2016-07-22 LAB — PHOSPHORUS: Phosphorus: 4.7 mg/dL — ABNORMAL HIGH (ref 2.5–4.6)

## 2016-07-22 NOTE — Progress Notes (Signed)
Referring Physician(s): Berneice Heinrich, T Supervising Physician: Oley Balm  Patient Status:  [facility] - In-pt  Chief Complaint: LLQ fluid collection/urinoma  Subjective:  Patient is doing well; denies any pain, nausea or vomiting.   Allergies: Doxycycline  Medications: Prior to Admission medications   Medication Sig Start Date End Date Taking? Authorizing Provider  atorvastatin (LIPITOR) 10 MG tablet Take 1 tablet (10 mg total) by mouth daily. 06/03/16  Yes Jaclyn Shaggy, MD  lisinopril-hydrochlorothiazide (PRINZIDE,ZESTORETIC) 10-12.5 MG tablet Take 1 tablet by mouth daily. 06/03/16  Yes Jaclyn Shaggy, MD  oxyCODONE-acetaminophen (ROXICET) 5-325 MG tablet Take 1-2 tablets by mouth every 4 (four) hours as needed for severe pain. 07/08/16  Yes Carolin Coy, MD  tamsulosin (FLOMAX) 0.4 MG CAPS capsule Take 1 capsule (0.4 mg total) by mouth daily. 07/08/16  Yes Carolin Coy, MD  cyclobenzaprine (FLEXERIL) 10 MG tablet Take 1 tablet (10 mg total) by mouth 3 (three) times daily as needed for muscle spasms. Patient not taking: Reported on 07/15/2016 06/03/16   Jaclyn Shaggy, MD     Vital Signs: BP 111/63 (BP Location: Right Arm)   Pulse 74   Temp 98.4 F (36.9 C) (Oral)   Resp 18   Ht 5\' 7"  (1.702 m)   Wt 229 lb 8 oz (104.1 kg)   SpO2 97%   BMI 35.94 kg/m   Physical Exam LLQ drain is intact. Site is clean, dry, and without erythema. 35 cc drained in total.  Imaging: Ct Abdomen Pelvis W Contrast  Addendum Date: 07/21/2016   ADDENDUM REPORT: 07/21/2016 19:50 ADDENDUM: These results will be called to the ordering clinician or representative by the Radiologist Assistant, and communication documented in the PACS or zVision Dashboard. Electronically Signed   By: Gerome Sam III M.D   On: 07/21/2016 19:50   Result Date: 07/21/2016 CLINICAL DATA:  The patient has a horseshoe kidney. The patient had a pyeloplasty of the left renal pelvis July 08, 2016. A urine leak and hematoma developed  and a drain was placed July 16, 2016. Follow-up. EXAM: CT ABDOMEN AND PELVIS WITH CONTRAST TECHNIQUE: Multidetector CT imaging of the abdomen and pelvis was performed using the standard protocol following bolus administration of intravenous contrast. CONTRAST:  ISOVUE-300 IOPAMIDOL (ISOVUE-300) INJECTION 61% COMPARISON:  May 06, 2016, July 15, 2016, July 16, 2016 FINDINGS: Lower chest: There is a tiny left pleural effusion. The lung bases are otherwise normal. Hepatobiliary: The gallbladder is decompressed limiting evaluation. The portal vein and liver are normal. Pancreas: Unremarkable. No pancreatic ductal dilatation or surrounding inflammatory changes. Spleen: Normal in size without focal abnormality. Adrenals/Urinary Tract: The adrenal glands are normal. The patient has a horseshoe kidney. The right moiety is normal in appearance with no obstruction. The left hydronephrosis has significantly improved but there is enhancement of the renal pelvis urothelium with thickening of the pelvic wall and adjacent stranding. There is increasing air within the left renal pelvis as well. The small fluid collection anterior to the medial aspect of the left renal pelvis described on the July 15, 2016 study connects to the renal pelvis and is also inflamed in appearance. Another collection of air anterior to the lateral aspect of the left renal pelvis is also thought to be connected to the renal pelvis and is filled with air. There is increased attenuation of fat extending down the left pericolic gutter into the left side of the pelvis. The previously identified hematoma/urinoma has almost completely resolved with only minimal fluid remaining. However,  there is an elliptical fluid collection just deep to the pelvis wall on series 3, image 67 which will be more fully described below. There is a left ureteral stent extending from the left renal pelvis into the bladder. The bladder is decompressed but  unremarkable. The blood products in the left renal pelvis have almost completely resolved. Enhancement of the left kidney has improved and is only slightly less than enhancement on the right. Stomach/Bowel: The stomach and small bowel are normal. The colon is normal. The visualized appendix is unremarkable. Vascular/Lymphatic: No significant vascular findings are present. No enlarged abdominal or pelvic lymph nodes. Reproductive: Prostate is unremarkable. Other: There is a focus of free air in the abdomen on series 3, image 73 which is actually decreased since July 15, 2016. This could have been introduced during the recent surgery for drain placement. There is subcutaneous air in the left side of the abdomen which has decreased since July 15, 2016 but remains. This air extends into the left inguinal canal and scrotum, also decreased in the interval. This is also likely from recent surgery/instrumentation. The collection of urine and blood in the left lower on the previous study has markedly improved in the interval. There is still increased attenuation in the fat but no large collection remains around the drainage catheter. There is a tiny collection of fluid and air just anterior to the pigtail catheter measuring 2.0 x 0.7 cm. There is an elliptical fluid collection just deep to the pelvic musculature on series 3, image 67 which was not seen previously measuring 5.2 x 1.3 cm. This could represent either a sterile or infected collection. This could represent developing abscess, urinoma, or evolving blood products. No other drainable collections of fluid are identified. Musculoskeletal: No acute or significant osseous findings. IMPRESSION: 1. The hematoma in the left renal pelvis on the previous study has almost completely resolved. There is less dilatation as well. However, there is enhancement of the urothelium as well as associated wall thickening consistent with infection or inflammation. Two pockets of  air anterior to the main portion of the renal pelvis are both likely connected to the renal pelvis. The unusual configuration of the renal pelvis is probably due to the recent surgery. There is increasing air in the left renal pelvis with another focus in an upper pole calyx. The increasing air is concerning for infection as I am unaware of any interval instrumentation which may have introduced air in the interval. Recommend clinical correlation. The left ureteral stent remains in place. 2. The previously identified hematoma/urinoma in the left pelvis has almost completely resolved and there is no large collection surrounding the pigtail catheter. There is a tiny collection of gas and air just anterior to the pigtail catheter measuring 2.0 x 0.7 cm on series 3, image 73. There is also an elliptical collection of fluid just deep to the left pelvic musculature measuring 5.2 x 1.3 cm, not seen previously. Whether this is infected or sterile is unclear. 3. There is air in the subcutaneous tissues and between the musculature of the left abdominal and pelvic wall, tracking through the inguinal canal into the scrotum. The amount of air has decreased in the interval and is probably postprocedural. The decreasing amount of air would argue against an infectious etiology. Recommend clinical correlation. 4. There is a tiny focus of free air in the abdomen which has decreased in the interval, likely from recent surgery and instrumentation. Electronically Signed: By: Gerome Samavid  Williams III M.D On: 07/21/2016  19:43    Labs:  CBC:  Recent Labs  07/19/16 0849 07/20/16 1349 07/21/16 0518 07/22/16 0454  WBC 16.4* 14.5* 17.2* 14.5*  HGB 9.0* 9.0* 8.9* 8.9*  HCT 26.6* 27.2* 27.1* 27.1*  PLT 529* 555* 578* 594*    COAGS:  Recent Labs  07/16/16 0137  INR 1.10  1.16  APTT 36  37*    BMP:  Recent Labs  07/19/16 0849 07/20/16 1349 07/21/16 0518 07/22/16 0454  NA 135 138 140 139  K 4.2 4.0 4.0 3.9  CL 101  105 106 107  CO2 24 25 25 24   GLUCOSE 98 149* 103* 101*  BUN 19 19 21* 17  CALCIUM 8.7* 8.9 9.0 8.8*  CREATININE 0.84 0.94 0.75 0.83  GFRNONAA >60 >60 >60 >60  GFRAA >60 >60 >60 >60    LIVER FUNCTION TESTS:  Recent Labs  07/16/16 0344 07/20/16 1349 07/21/16 0518 07/22/16 0454  BILITOT 2.1* 0.5 0.6 0.5  AST 34 44* 34 27  ALT 79* 98* 84* 68*  ALKPHOS 265* 345* 317* 248*  PROT 7.5 7.1 7.1 6.9  ALBUMIN 3.0* 3.0* 3.1* 3.0*    Assessment and Plan: S/p LLQ drain for fluid collection/urinoma 1/13;  intact with no signs of infection.  WBC trending down. Creat nl; per urology possible drain removal tomorrow. Continue with current treatment for now. Latest CT reviewed by Dr. Deanne Coffer with near resolution of drained collection.    Electronically Signed: D. Jeananne Rama 07/22/2016, 4:34 PM   I spent a total of 15 Minutes at the the patient's bedside AND on the patient's hospital floor or unit, greater than 50% of which was counseling/coordinating care for LLQ drain.    Patient ID: [name] [name], male   DOB: 07-28-1980, 36 y.o.   MRN: [id number]

## 2016-07-22 NOTE — Progress Notes (Addendum)
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 On 07/08/2016 patient underwent a robotic assisted laparoscopic left dismembered pyeloplastyalso had an insertion of left ureteral stent he was able to be discharged to home in good health but continued to have left scrotal pain at first which was mild. Approximately 2 Days prior to admission he developed pain that isconstant and throbbing he also had associated low-grade temperature continued to have good urine output But the day before admission he developed severe left flank pain and hematuria. Pain is severe radiating to the testicles on the left side as well as left flank. CT Abd/Pelvis was done and showing: Diffuse hemorrhage feeling markedly dilated left renal calyces and left renal pelvis origin free fluid in the left lower quadrant free fluid in the pelvis possible urine leak mildly decreased enhancement of the left side of caution kidney worrisome for mild disruption of the blood supply. Patient was admitted to SDU because of Sepsis and PCCM was notified. Urology and Interventional Radiology consulted and are following. Patient underwent CT guided catheter/drain placement on 1/13. Patient tolerated procedure well and vital signs slowly returned to normal.  H/H stabilized and Cr improved. He is improving and still has some serosanguinous drainage from drain. Foley Catheter was now removed by Urology and repeat CT of Abdomen and Pelvis was recommended by Interventional Radiology prior to  removing drain. CT Scan of Abdomen reviewed and discussed with Urology and IR and possible drain removal in AM by Urology and Discharge Home.   Assessment & Plan:   Active Problems:   Hypertension   Hyperlipidemia   UPJ (ureteropelvic junction) obstruction   UTI (urinary tract infection)   Hyponatremia   Anemia   Sepsis (HCC)  Sepsis 2/2 to UTI and Renal Hemorrhage with Urine Leak poA -Sepsis Physiology is improving and resolving -U/A on 07/15/2016 showed Many Bacteria, Moderate Leukocytes, Positive Nitrite and TNTC WBC; Urine Cx showed No Growth  -Blood Cx x 2 showed NGTD at 5 Days -Given 30 cc/kg Initially; IVF now D/C'd as patient tolerating po well.  -WBC improved to 14.5 however down from Admission being 24.3; Discussed with Dr. Berneice Heinrich and feels as if it is inflammatory cause from what CT Findings show and less likely infectious -Recently s/p Left Dismembered Robotic Pyeloplasty 07/08/16 with insertion of Left Ureteral Stent for Left UPJ Obstruction in a Horseshoe Kidney -C/w Ciprofloxacin 500 mg po BID and Metronidazole 500 mg q8h -Continue to Monitor; Foley Catheter Discontinued by Urology -Possible Drain removal in AM and D/C Home if stable and cleared by Urology  LLQ Urinoma/Hematoma -CT Scan of Abd/Pelvis done and showed hemorrhage and associated urine leak -S/p Drain placement by Dr. Grace Isaac on 07/16/16 -JP Creatinine on 07/18/16 was 39.3 consistent with Urine Leak -Urinoma drainage Gram Stain: ABUNDANT WBC PRESENT, PREDOMINANTLY MONONUCLEAR NO ORGANISMS SEEN; Urinoma Cx showed No Growth 2 days and No Anaerobes Isolated. -Urology and IR following and Managing -Repeat CT Scan reviewed and discussed with Urology and Interventional Radiology: Shown as below  -Hematoma/Urinoma in the Left Pelvis has almost completely resolved and there is no  large collection surrounding pigtail catheter; There was a tiny collection of gas just anterior to the pigtail cathether measuring 2.0 x 0.7 cm as  well as an ellipitcal collection of fluid just deep to the left pelvic musculature measuring 5.2 x 1.3 not seen previously; *discussed with Dr. Deanne Coffer in Interventional Radiology and is too small to drain -CT Scan also showed there is enhancement of the urothelium as well as associated wall thickening consistent with infection or inflammation. Two pockets of air anterior to the main portion of the renal pelvis are both likely connected to the renal pelvis. The unusual configuration of the renal pelvis is probably due to the recent surgery. There is increasing air in the left renal pelvis with another focus in an upper pole calyx. The left ureteral stent remains in place. There was also is air in the subcutaneous tissues and between the musculature of the left abdominal and pelvic wall, tracking through the inguinal canal into the scrotum. The amount of air has decreased in the interval and is probably postprocedural. The decreasing amount of air would argue against an infectious etiology. In addition there was a tiny focus of free air in the abdomen which has decreased in the interval, likely from recent surgery and Instrumentation.  -Urology not concerned as findings normal for having foley catheter per Dr. Berneice Heinrich; Possible Drain removal in AM and D/C Home -Dr. Berneice Heinrich of Urology to Evaluate patient later on today  Renal hemorrhage with a urine leak resulting Hematuria and contributing to Sepsis from UTI -CT Scan of Abd/Pelvis showed Diffuse hemorrhage filling the markedly dilated left renal calyces and left renal pelvis, which may arise within a pocket of fluid directly medial to the proximal left ureter. Additional hemorrhage and free fluid tracking inferiorly along the left lower quadrant, with free fluid in the pelvis, concerning for associated urine leak. Minimal underlying soft tissue air is thought to be postoperative in nature.  Mildly decreased enhancement of the left side of the horseshoe kidney,  concerning for mild disruption of blood supply to the left kidney. Left ureteral stent noted in expected position. Diffuse soft tissue air along the left anterior abdominal wall, tracking to the left side of the scrotum. This appears reflect the patient's recent laparoscopic surgery. -Urology, IR and PCCM consulted - Hb/Hct Stable at 8.9/27.1 - Heart healthy diet- patient tolerating this well - Kidney function WNL: Last BUN/Cr was 17/0.83 -Patient slowly improving- drain still with serosanginous drainage - Urology Managing -Urinoma drainage Gram Stain: ABUNDANT WBC PRESENT, PREDOMINANTLY MONONUCLEAR NO ORGANISMS SEEN -C/w Ciprofloxacin 500 mg po BID and Metronidazole 500 mg q8h -Discussed with Dr. Berneice Heinrich and recommends 1 more week of Abx after Drain Removal -Repeat CT Scan of Abd/Pelvis results as above; Dr. Berneice Heinrich to Evaluate Patient later today  Hyperlipidemia -C/w Home Statin of Atorvastatin 10 mg po Daily  Hypertension  -Held Lisinopril due to AKI; Will likely restart in AM -Held HCTZ -Blood pressures well controlled currently with out medication -Continue to Monitor Closely  UTI (urinary tract infection)  -Urine culture showing No Growth -C/w Cipro and Flagyl -As above  Hyponatremia, improved -Was 139 this AM -Likely attributed to dehydration -Continue to Monitor and Repeat CMP in AM  Anemiasuspect ABL 2/2 to Postoperative bleeding/Renal Hemorrhage -Stable -Type and screen ordered -CBC showed Hb/Hct going from 8.4/25.5 -> 9.0/26.6 -> 8.9/27.1 -> 8.9/27.1 -Continue to monitor  DVT prophylaxis: SCD's Code Status: FULL CODE Family Communication: No Family present at bedside Disposition Plan: Home once Stable from  Urology Standpoint; Likely tomorrow after possible Drain removal  Consultants:   Urology  PCCM  General Surgery (by EDP)  Interventional Radiology   Procedures: CT guided placement of 10Fr drainage catheter/ Left robotic  Pyeloplasty   Antimicrobials: Anti-infectives    Start     Dose/Rate Route Frequency Ordered Stop   07/19/16 1400  metroNIDAZOLE (FLAGYL) tablet 500 mg     500 mg Oral Every 8 hours 07/19/16 0828     07/19/16 1000  ciprofloxacin (CIPRO) tablet 500 mg     500 mg Oral 2 times daily 07/19/16 0828     07/16/16 1000  vancomycin (VANCOCIN) IVPB 750 mg/150 ml premix  Status:  Discontinued     750 mg 150 mL/hr over 60 Minutes Intravenous Every 12 hours 07/16/16 0053 07/19/16 0828   07/16/16 0200  piperacillin-tazobactam (ZOSYN) IVPB 3.375 g  Status:  Discontinued     3.375 g 12.5 mL/hr over 240 Minutes Intravenous Every 8 hours 07/16/16 0013 07/19/16 0828   07/15/16 1945  vancomycin (VANCOCIN) 2,000 mg in sodium chloride 0.9 % 500 mL IVPB     2,000 mg 250 mL/hr over 120 Minutes Intravenous  Once 07/15/16 1938 07/16/16 0700   07/15/16 1945  ceFEPIme (MAXIPIME) 1 g in dextrose 5 % 50 mL IVPB     1 g 100 mL/hr over 30 Minutes Intravenous  Once 07/15/16 1938 07/15/16 2123      Subjective: Seen and examined at bedside and had Interpreter Alex [phone] interpreted while Nurse and I were in the room. Discussed CT findings with patient and he was told that Dr. Berneice Heinrich would further elaborate. Patient denied any concerns and only complained of mild left testicular discomfort. No N/V. No CP or SOB. Ambulating well.   Objective: Vitals:   07/21/16 1308 07/21/16 2003 07/22/16 0613 07/22/16 1300  BP: 111/62 119/63 110/60 111/63  Pulse: 78 89 63 74  Resp: 18 17 15 18   Temp: 98.4 F (36.9 C) 98.8 F (37.1 C) 98.2 F (36.8 C) 98.4 F (36.9 C)  TempSrc: Oral Oral Oral Oral  SpO2: 97% 98% 98% 97%  Weight:      Height:        Intake/Output Summary (Last 24 hours) at 07/22/16 1525 Last data filed at 07/22/16 1400  Gross per 24 hour  Intake              380 ml  Output             2125 ml  Net            -1745 ml   Filed Weights   07/16/16 0015 07/17/16 0000  Weight: 103.1 kg (227 lb 4.7 oz)  104.1 kg (229 lb 8 oz)   Examination: Physical Exam:  Constitutional: WN/WD, NAD and appears calm and comfortable sitting in chair bedside Eyes: Lids and conjunctivae normal, sclerae anicteric  ENMT: External Ears, Nose appear normal. Grossly normal hearing.  Neck: Appears normal, supple, no cervical masses, normal ROM, no appreciable thyromegaly, no visible JVD Respiratory: Clear to auscultation bilaterally, no wheezing, rales, rhonchi or crackles. Normal respiratory effort and patient is not tachypenic. No accessory muscle use.  Cardiovascular: RRR, no murmurs / rubs / gallops. S1 and S2 auscultated. No extremity edema..  Abdomen: Soft, non-tender, non-distended. No masses palpated. No appreciable hepatosplenomegaly. Bowel sounds positive x4.  GU: Deferred.  Musculoskeletal: No clubbing / cyanosis of digits/nails. No joint deformity upper and lower extremities.  Skin: No rashes, lesions, ulcers. No induration; Warm  and dry. Renal Drain draining minimal fluid Neurologic: CN 2-12 grossly intact with no focal deficits. Sensation intact in all 4 Extremities. Romberg sign cerebellar reflexes not assessed.  Psychiatric: Normal judgment and insight. Alert and oriented x 3. Normal mood and appropriate affect.   Data Reviewed: I have personally reviewed following labs and imaging studies  CBC:  Recent Labs Lab 07/15/16 1818  07/18/16 0342 07/19/16 0849 07/20/16 1349 07/21/16 0518 07/22/16 0454  WBC 25.2*  < > 15.7* 16.4* 14.5* 17.2* 14.5*  NEUTROABS 22.9*  --   --   --  11.4* 13.2* 10.3*  HGB 10.8*  < > 8.4* 9.0* 9.0* 8.9* 8.9*  HCT 31.0*  < > 25.5* 26.6* 27.2* 27.1* 27.1*  MCV 83.1  < > 84.7 85.0 86.3 86.6 87.1  PLT 396  < > 463* 529* 555* 578* 594*  < > = values in this interval not displayed. Basic Metabolic Panel:  Recent Labs Lab 07/16/16 0344  07/18/16 0342 07/19/16 0849 07/20/16 1349 07/21/16 0518 07/22/16 0454  NA 127*  < > 132* 135 138 140 139  K 4.3  < > 4.1 4.2 4.0  4.0 3.9  CL 92*  < > 99* 101 105 106 107  CO2 24  < > 24 24 25 25 24   GLUCOSE 138*  < > 109* 98 149* 103* 101*  BUN 22*  < > 20 19 19  21* 17  CREATININE 1.24  < > 0.94 0.84 0.94 0.75 0.83  CALCIUM 8.3*  < > 8.4* 8.7* 8.9 9.0 8.8*  MG 2.2  --   --   --  2.1 2.0 1.9  PHOS 3.6  --   --   --  3.6 4.1 4.7*  < > = values in this interval not displayed. GFR: Estimated Creatinine Clearance: 144.2 mL/min (by C-G formula based on SCr of 0.83 mg/dL). Liver Function Tests:  Recent Labs Lab 07/16/16 0344 07/20/16 1349 07/21/16 0518 07/22/16 0454  AST 34 44* 34 27  ALT 79* 98* 84* 68*  ALKPHOS 265* 345* 317* 248*  BILITOT 2.1* 0.5 0.6 0.5  PROT 7.5 7.1 7.1 6.9  ALBUMIN 3.0* 3.0* 3.1* 3.0*   No results for input(s): LIPASE, AMYLASE in the last 168 hours. No results for input(s): AMMONIA in the last 168 hours. Coagulation Profile:  Recent Labs Lab 07/16/16 0137  INR 1.10  1.16   Cardiac Enzymes: No results for input(s): CKTOTAL, CKMB, CKMBINDEX, TROPONINI in the last 168 hours. BNP (last 3 results) No results for input(s): PROBNP in the last 8760 hours. HbA1C: No results for input(s): HGBA1C in the last 72 hours. CBG: No results for input(s): GLUCAP in the last 168 hours. Lipid Profile: No results for input(s): CHOL, HDL, LDLCALC, TRIG, CHOLHDL, LDLDIRECT in the last 72 hours. Thyroid Function Tests: No results for input(s): TSH, T4TOTAL, FREET4, T3FREE, THYROIDAB in the last 72 hours. Anemia Panel: No results for input(s): VITAMINB12, FOLATE, FERRITIN, TIBC, IRON, RETICCTPCT in the last 72 hours. Sepsis Labs:  Recent Labs Lab 07/15/16 2033 07/16/16 0137 07/16/16 0344  PROCALCITON  --  0.74  --   LATICACIDVEN 1.58 0.6 0.8    Recent Results (from the past 240 hour(s))  Urine culture     Status: None   Collection Time: 07/15/16  5:30 PM  Result Value Ref Range Status   Specimen Description URINE, CLEAN CATCH  Final   Special Requests NONE  Final   Culture NO  GROWTH [facility]   Final  Report Status 07/17/2016 FINAL  Final  Blood culture (routine x 2)     Status: None   Collection Time: 07/15/16  8:34 PM  Result Value Ref Range Status   Specimen Description BLOOD RIGHT ANTECUBITAL  Final   Special Requests BOTTLES DRAWN AEROBIC AND ANAEROBIC 5CC EACH  Final   Culture   Final    NO GROWTH 5 DAYS Performed at [facility], [address] [place], [place] [address]    Report Status 07/21/2016 FINAL  Final  Blood culture (routine x 2)     Status: None   Collection Time: 07/15/16  8:43 PM  Result Value Ref Range Status   Specimen Description BLOOD LEFT ANTECUBITAL  Final   Special Requests BOTTLES DRAWN AEROBIC AND ANAEROBIC 5CC EACH  Final   Culture   Final    NO GROWTH 5 DAYS Performed at [facility], [address] [place], [place] [address]    Report Status 07/21/2016 FINAL  Final  MRSA PCR Screening     Status: None   Collection Time: 07/16/16  2:47 AM  Result Value Ref Range Status   MRSA by PCR NEGATIVE NEGATIVE Final    Comment:        The GeneXpert MRSA Assay (FDA approved for NASAL specimens only), is one component of a comprehensive MRSA colonization surveillance program. It is not intended to diagnose MRSA infection nor to guide or monitor treatment for MRSA infections.   Aerobic/Anaerobic Culture (surgical/deep wound)     Status: None   Collection Time: 07/16/16 11:13 AM  Result Value Ref Range Status   Specimen Description DRAINAGE URINOMA  Final   Special Requests Normal  Final   Gram Stain   Final    ABUNDANT WBC PRESENT, PREDOMINANTLY MONONUCLEAR NO ORGANISMS SEEN    Culture   Final    No growth aerobically or anaerobically. Performed at [facility], [address] [place], [place] [address]    Report Status 07/21/2016 FINAL  Final    Radiology Studies: Ct Abdomen Pelvis W Contrast  Addendum Date: 07/21/2016   ADDENDUM REPORT: 07/21/2016 19:50 ADDENDUM:  These results will be called to the ordering clinician or representative by the Radiologist Assistant, and communication documented in the PACS or zVision Dashboard. Electronically Signed   By: Gerome Sam III M.D   On: 07/21/2016 19:50   Result Date: 07/21/2016 CLINICAL DATA:  The patient has a horseshoe kidney. The patient had a pyeloplasty of the left renal pelvis July 08, 2016. A urine leak and hematoma developed and a drain was placed July 16, 2016. Follow-up. EXAM: CT ABDOMEN AND PELVIS WITH CONTRAST TECHNIQUE: Multidetector CT imaging of the abdomen and pelvis was performed using the standard protocol following bolus administration of intravenous contrast. CONTRAST:  ISOVUE-300 IOPAMIDOL (ISOVUE-300) INJECTION 61% COMPARISON:  May 06, 2016, July 15, 2016, July 16, 2016 FINDINGS: Lower chest: There is a tiny left pleural effusion. The lung bases are otherwise normal. Hepatobiliary: The gallbladder is decompressed limiting evaluation. The portal vein and liver are normal. Pancreas: Unremarkable. No pancreatic ductal dilatation or surrounding inflammatory changes. Spleen: Normal in size without focal abnormality. Adrenals/Urinary Tract: The adrenal glands are normal. The patient has a horseshoe kidney. The right moiety is normal in appearance with no obstruction. The left hydronephrosis has significantly improved but there is enhancement of the renal pelvis urothelium with thickening of the pelvic wall and adjacent stranding. There is increasing air within the left renal pelvis  as well. The small fluid collection anterior to the medial aspect of the left renal pelvis described on the July 15, 2016 study connects to the renal pelvis and is also inflamed in appearance. Another collection of air anterior to the lateral aspect of the left renal pelvis is also thought to be connected to the renal pelvis and is filled with air. There is increased attenuation of fat extending down the left  pericolic gutter into the left side of the pelvis. The previously identified hematoma/urinoma has almost completely resolved with only minimal fluid remaining. However, there is an elliptical fluid collection just deep to the pelvis wall on series 3, image 67 which will be more fully described below. There is a left ureteral stent extending from the left renal pelvis into the bladder. The bladder is decompressed but unremarkable. The blood products in the left renal pelvis have almost completely resolved. Enhancement of the left kidney has improved and is only slightly less than enhancement on the right. Stomach/Bowel: The stomach and small bowel are normal. The colon is normal. The visualized appendix is unremarkable. Vascular/Lymphatic: No significant vascular findings are present. No enlarged abdominal or pelvic lymph nodes. Reproductive: Prostate is unremarkable. Other: There is a focus of free air in the abdomen on series 3, image 73 which is actually decreased since July 15, 2016. This could have been introduced during the recent surgery for drain placement. There is subcutaneous air in the left side of the abdomen which has decreased since July 15, 2016 but remains. This air extends into the left inguinal canal and scrotum, also decreased in the interval. This is also likely from recent surgery/instrumentation. The collection of urine and blood in the left lower on the previous study has markedly improved in the interval. There is still increased attenuation in the fat but no large collection remains around the drainage catheter. There is a tiny collection of fluid and air just anterior to the pigtail catheter measuring 2.0 x 0.7 cm. There is an elliptical fluid collection just deep to the pelvic musculature on series 3, image 67 which was not seen previously measuring 5.2 x 1.3 cm. This could represent either a sterile or infected collection. This could represent developing abscess, urinoma, or  evolving blood products. No other drainable collections of fluid are identified. Musculoskeletal: No acute or significant osseous findings. IMPRESSION: 1. The hematoma in the left renal pelvis on the previous study has almost completely resolved. There is less dilatation as well. However, there is enhancement of the urothelium as well as associated wall thickening consistent with infection or inflammation. Two pockets of air anterior to the main portion of the renal pelvis are both likely connected to the renal pelvis. The unusual configuration of the renal pelvis is probably due to the recent surgery. There is increasing air in the left renal pelvis with another focus in an upper pole calyx. The increasing air is concerning for infection as I am unaware of any interval instrumentation which may have introduced air in the interval. Recommend clinical correlation. The left ureteral stent remains in place. 2. The previously identified hematoma/urinoma in the left pelvis has almost completely resolved and there is no large collection surrounding the pigtail catheter. There is a tiny collection of gas and air just anterior to the pigtail catheter measuring 2.0 x 0.7 cm on series 3, image 73. There is also an elliptical collection of fluid just deep to the left pelvic musculature measuring 5.2 x 1.3 cm, not seen previously.  Whether this is infected or sterile is unclear. 3. There is air in the subcutaneous tissues and between the musculature of the left abdominal and pelvic wall, tracking through the inguinal canal into the scrotum. The amount of air has decreased in the interval and is probably postprocedural. The decreasing amount of air would argue against an infectious etiology. Recommend clinical correlation. 4. There is a tiny focus of free air in the abdomen which has decreased in the interval, likely from recent surgery and instrumentation. Electronically Signed: By: Gerome Sam III M.D On: 07/21/2016 19:43    Scheduled Meds: . atorvastatin  10 mg Oral Daily  . ciprofloxacin  500 mg Oral BID  . metroNIDAZOLE  500 mg Oral Q8H  . sodium chloride flush  3 mL Intravenous Q12H   Continuous Infusions:   LOS: 7 days   Merlene Laughter, DO [facility] Pager [phone]  If 7PM-7AM, please contact night-coverage [email or web address] Password [email or web address] 07/22/2016, 3:25 PM

## 2016-07-22 NOTE — Care Management Note (Signed)
Case Management Note  Patient Details  Name: [name] [name] MRN: [id number] Date of Birth: Aug 23, 1980  Subjective/Objective:   36 y.o. 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   Expected Discharge Date:   (unknown)               Expected Discharge Plan:     In-House Referral:     Discharge planning Services     Post Acute Care Choice:    Choice offered to:     DME Arranged:    DME Agency:     HH Arranged:    HH Agency:     Status of Service:     If discussed at [employer] Meetings, dates discussed:    Additional Comments: Will continue to follow for D/C.  [name] [name], [name] Pinkus, RN 07/22/2016, 4:31 PM

## 2016-07-22 NOTE — Progress Notes (Signed)
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   2 - Left Perinephric / Pelvic Fluid Collection - new perinephric and psoas/ pelvic fluid collection by admit CT 07/15/16 and s/p IR drain 1/13. 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 JP Cr 1/15 39 c/w some urine extrav.  1/15 - approx 75% volume via foley / 25% drain 1/16 - approx 90% volume via foley / 10% drain 1/17 - 98% volume via foley / 2% drain 1/18 - Foley removed, CT with nearly completely resolved fluid collection and sig improved hydro,  1/19 - <4mL drain (remains serous)  3 - Fevers / Leukocytosis - fevers, bacteruria, leukocytosis on admit 1/12 c/w possible cystitis and or left pyelonephritis. UCX 1/12 negative (after ABX given), CX from left pelvic fluid collection 1/13  negative. Initially on Vanc + Zosyn per pharmacy then narrowed to Cipro / Flagyl.  Today "[name] [name]" is w/o complaints. No interval fevers. Interval CT with near complete resolution of prior urinoma / hematoma. Drain output remains scant with foley out now.   Objective: Vital signs in last 24 hours: Temp:  [98.2 F (36.8 C)-98.8 F (37.1 C)] 98.4 F (36.9 C) (01/19 1300) Pulse Rate:  [63-89] 74 (01/19 1300) Resp:  [15-18] 18 (01/19 1300) BP: (110-119)/(60-63) 111/63 (01/19 1300) SpO2:  [97 %-98 %] 97 % (01/19 1300) Last BM Date: 07/19/16  Intake/Output from previous day: 01/18 0701 - 01/19 0700 In: 1040 [P.O.:1020] Out: 1745 [Urine:1725; Drains:20] Intake/Output this shift: Total I/O In: 5 [Other:5] Out: 565 [Urine:550; Drains:15]  General appearance: alert, cooperative and appears stated age Ears: normal TM's and external ear canals both ears Nose: Nares normal. Septum  midline. Mucosa normal. No drainage or sinus tenderness. Throat: lips, mucosa, and tongue normal; teeth and gums normal Neck: supple, symmetrical, trachea midline Back: symmetric, no curvature. ROM normal. No CVA tenderness. Resp: non-labored on room air.  Cardio: Nl rate GI: soft, non-tender; bowel sounds normal; no masses,  no organomegaly Male genitalia: normal Extremities: extremities normal, atraumatic, no cyanosis or edema Pulses: 2+ and symmetric Skin: Skin color, texture, turgor normal. No rashes or lesions Neurologic: Grossly normal Incision/Wound: IR drain with scant serosanguinous output that is non-foul.   Lab Results:   Recent Labs  07/21/16 0518 07/22/16 0454  WBC 17.2* 14.5*  HGB 8.9* 8.9*  HCT 27.1* 27.1*  PLT 578* 594*   BMET  Recent Labs  07/21/16 0518 07/22/16 0454  NA 140 139  K 4.0 3.9  CL 106 107  CO2 25 24  GLUCOSE 103* 101*  BUN 21* 17  CREATININE 0.75 0.83  CALCIUM 9.0 8.8*   PT/INR No results for input(s): LABPROT, INR in the last 72 hours. ABG No results for input(s): PHART, HCO3 in the last 72 hours.  Invalid input(s): PCO2, PO2  Studies/Results: Ct Abdomen Pelvis W Contrast  Addendum Date: 07/21/2016   ADDENDUM REPORT: 07/21/2016 19:50 ADDENDUM: These results will be called to the ordering clinician or representative by the Radiologist Assistant, and communication documented in the PACS or zVision Dashboard. Electronically Signed   By: Gerome Sam III M.D   On: 07/21/2016 19:50   Result Date: 07/21/2016 CLINICAL DATA:  The patient has a horseshoe kidney. The patient had a pyeloplasty of the left renal pelvis  July 08, 2016. A urine leak and hematoma developed and a drain was placed July 16, 2016. Follow-up. EXAM: CT ABDOMEN AND PELVIS WITH CONTRAST TECHNIQUE: Multidetector CT imaging of the abdomen and pelvis was performed using the standard protocol following bolus administration of intravenous contrast. CONTRAST:   ISOVUE-300 IOPAMIDOL (ISOVUE-300) INJECTION 61% COMPARISON:  May 06, 2016, July 15, 2016, July 16, 2016 FINDINGS: Lower chest: There is a tiny left pleural effusion. The lung bases are otherwise normal. Hepatobiliary: The gallbladder is decompressed limiting evaluation. The portal vein and liver are normal. Pancreas: Unremarkable. No pancreatic ductal dilatation or surrounding inflammatory changes. Spleen: Normal in size without focal abnormality. Adrenals/Urinary Tract: The adrenal glands are normal. The patient has a horseshoe kidney. The right moiety is normal in appearance with no obstruction. The left hydronephrosis has significantly improved but there is enhancement of the renal pelvis urothelium with thickening of the pelvic wall and adjacent stranding. There is increasing air within the left renal pelvis as well. The small fluid collection anterior to the medial aspect of the left renal pelvis described on the July 15, 2016 study connects to the renal pelvis and is also inflamed in appearance. Another collection of air anterior to the lateral aspect of the left renal pelvis is also thought to be connected to the renal pelvis and is filled with air. There is increased attenuation of fat extending down the left pericolic gutter into the left side of the pelvis. The previously identified hematoma/urinoma has almost completely resolved with only minimal fluid remaining. However, there is an elliptical fluid collection just deep to the pelvis wall on series 3, image 67 which will be more fully described below. There is a left ureteral stent extending from the left renal pelvis into the bladder. The bladder is decompressed but unremarkable. The blood products in the left renal pelvis have almost completely resolved. Enhancement of the left kidney has improved and is only slightly less than enhancement on the right. Stomach/Bowel: The stomach and small bowel are normal. The colon is normal. The  visualized appendix is unremarkable. Vascular/Lymphatic: No significant vascular findings are present. No enlarged abdominal or pelvic lymph nodes. Reproductive: Prostate is unremarkable. Other: There is a focus of free air in the abdomen on series 3, image 73 which is actually decreased since July 15, 2016. This could have been introduced during the recent surgery for drain placement. There is subcutaneous air in the left side of the abdomen which has decreased since July 15, 2016 but remains. This air extends into the left inguinal canal and scrotum, also decreased in the interval. This is also likely from recent surgery/instrumentation. The collection of urine and blood in the left lower on the previous study has markedly improved in the interval. There is still increased attenuation in the fat but no large collection remains around the drainage catheter. There is a tiny collection of fluid and air just anterior to the pigtail catheter measuring 2.0 x 0.7 cm. There is an elliptical fluid collection just deep to the pelvic musculature on series 3, image 67 which was not seen previously measuring 5.2 x 1.3 cm. This could represent either a sterile or infected collection. This could represent developing abscess, urinoma, or evolving blood products. No other drainable collections of fluid are identified. Musculoskeletal: No acute or significant osseous findings. IMPRESSION: 1. The hematoma in the left renal pelvis on the previous study has almost completely resolved. There is less dilatation as well. However, there is enhancement of the  urothelium as well as associated wall thickening consistent with infection or inflammation. Two pockets of air anterior to the main portion of the renal pelvis are both likely connected to the renal pelvis. The unusual configuration of the renal pelvis is probably due to the recent surgery. There is increasing air in the left renal pelvis with another focus in an upper pole  calyx. The increasing air is concerning for infection as I am unaware of any interval instrumentation which may have introduced air in the interval. Recommend clinical correlation. The left ureteral stent remains in place. 2. The previously identified hematoma/urinoma in the left pelvis has almost completely resolved and there is no large collection surrounding the pigtail catheter. There is a tiny collection of gas and air just anterior to the pigtail catheter measuring 2.0 x 0.7 cm on series 3, image 73. There is also an elliptical collection of fluid just deep to the left pelvic musculature measuring 5.2 x 1.3 cm, not seen previously. Whether this is infected or sterile is unclear. 3. There is air in the subcutaneous tissues and between the musculature of the left abdominal and pelvic wall, tracking through the inguinal canal into the scrotum. The amount of air has decreased in the interval and is probably postprocedural. The decreasing amount of air would argue against an infectious etiology. Recommend clinical correlation. 4. There is a tiny focus of free air in the abdomen which has decreased in the interval, likely from recent surgery and instrumentation. Electronically Signed: By: Gerome Samavid  Williams III M.D On: 07/21/2016 19:43    Anti-infectives: Anti-infectives    Start     Dose/Rate Route Frequency Ordered Stop   07/19/16 1400  metroNIDAZOLE (FLAGYL) tablet 500 mg     500 mg Oral Every 8 hours 07/19/16 0828     07/19/16 1000  ciprofloxacin (CIPRO) tablet 500 mg     500 mg Oral 2 times daily 07/19/16 0828     07/16/16 1000  vancomycin (VANCOCIN) IVPB 750 mg/150 ml premix  Status:  Discontinued     750 mg 150 mL/hr over 60 Minutes Intravenous Every 12 hours 07/16/16 0053 07/19/16 0828   07/16/16 0200  piperacillin-tazobactam (ZOSYN) IVPB 3.375 g  Status:  Discontinued     3.375 g 12.5 mL/hr over 240 Minutes Intravenous Every 8 hours 07/16/16 0013 07/19/16 0828   07/15/16 1945  vancomycin  (VANCOCIN) 2,000 mg in sodium chloride 0.9 % 500 mL IVPB     2,000 mg 250 mL/hr over 120 Minutes Intravenous  Once 07/15/16 1938 07/16/16 0700   07/15/16 1945  ceFEPIme (MAXIPIME) 1 g in dextrose 5 % 50 mL IVPB     1 g 100 mL/hr over 30 Minutes Intravenous  Once 07/15/16 1938 07/15/16 2123      Assessment/Plan:  1 - 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   2 - Left Perinephric / Pelvic Fluid Collection - Likely due to relative obstruction from blood / clot in renal pelvis, then subsequent extravasation. This has essentially resolved clinically and by f/u imaging.  We will plan to remove IR drain tomorrow AM as long as output remains <5050mL / 24 hrs.   We will manage these drains.   3 - Fevers / Leukocytosis - likely early pyelo by relative obstruction above. I feel is follow up imaging and overall clinical picture is reassuring as no increasing fluid collections / foul drainage. His slowly resolving luekocytosis  and persistent thrombocytosis likely remains reactive.   I feel reasonable to continue fluoroquinolone for another 7-10 days or so. Again, greatly appreciate medical team and IR comanagmeent.   Please call me directly with questions.   [facility], Tiana Sivertson 07/22/2016

## 2016-07-23 LAB — CBC WITH DIFFERENTIAL/PLATELET
Basophils Absolute: 0 10*3/uL (ref 0.0–0.1)
Basophils Relative: 0 %
EOS PCT: 4 %
Eosinophils Absolute: 0.6 10*3/uL (ref 0.0–0.7)
HCT: 28.2 % — ABNORMAL LOW (ref 39.0–52.0)
Hemoglobin: 9.2 g/dL — ABNORMAL LOW (ref 13.0–17.0)
LYMPHS ABS: 2.4 10*3/uL (ref 0.7–4.0)
LYMPHS PCT: 16 %
MCH: 28.7 pg (ref 26.0–34.0)
MCHC: 32.6 g/dL (ref 30.0–36.0)
MCV: 87.9 fL (ref 78.0–100.0)
MONOS PCT: 6 %
Monocytes Absolute: 0.9 10*3/uL (ref 0.1–1.0)
Neutro Abs: 10.7 10*3/uL — ABNORMAL HIGH (ref 1.7–7.7)
Neutrophils Relative %: 74 %
PLATELETS: 580 10*3/uL — AB (ref 150–400)
RBC: 3.21 MIL/uL — AB (ref 4.22–5.81)
RDW: 14.9 % (ref 11.5–15.5)
WBC: 14.6 10*3/uL — AB (ref 4.0–10.5)

## 2016-07-23 LAB — COMPREHENSIVE METABOLIC PANEL
ALK PHOS: 226 U/L — AB (ref 38–126)
ALT: 54 U/L (ref 17–63)
AST: 24 U/L (ref 15–41)
Albumin: 3 g/dL — ABNORMAL LOW (ref 3.5–5.0)
Anion gap: 7 (ref 5–15)
BUN: 16 mg/dL (ref 6–20)
CALCIUM: 8.6 mg/dL — AB (ref 8.9–10.3)
CHLORIDE: 107 mmol/L (ref 101–111)
CO2: 25 mmol/L (ref 22–32)
CREATININE: 0.83 mg/dL (ref 0.61–1.24)
GFR calc non Af Amer: >60 mL/min (ref >60)
Glucose, Bld: 113 mg/dL — ABNORMAL HIGH (ref 65–99)
Potassium: 3.7 mmol/L (ref 3.5–5.1)
Sodium: 139 mmol/L (ref 135–145)
Total Bilirubin: 0.6 mg/dL (ref 0.3–1.2)
Total Protein: 6.5 g/dL (ref 6.5–8.1)

## 2016-07-23 LAB — PHOSPHORUS: PHOSPHORUS: 3.9 mg/dL (ref 2.5–4.6)

## 2016-07-23 LAB — MAGNESIUM: MAGNESIUM: 1.8 mg/dL (ref 1.7–2.4)

## 2016-07-23 MED ORDER — OXYCODONE-ACETAMINOPHEN 5-325 MG PO TABS: 1.0000 | ORAL_TABLET | Freq: Four times a day (QID) | ORAL | 0 refills | Status: DC | PRN

## 2016-07-23 MED ORDER — CIPROFLOXACIN HCL 500 MG PO TABS: 500.0000 mg | ORAL_TABLET | Freq: Two times a day (BID) | ORAL | 0 refills | Status: DC

## 2016-07-23 MED ORDER — METRONIDAZOLE 500 MG PO TABS: 500.0000 mg | ORAL_TABLET | Freq: Three times a day (TID) | ORAL | 0 refills | Status: DC

## 2016-07-23 NOTE — Progress Notes (Signed)
D/C instructions reviewed w/ pt and wife using Spanish interpreter [phone]#[id number].  Pt and wife verbalize understanding and all questions answered. Pt aware of need to p/u scripts at pharmacy on way home. Pt d/c in w/c by NT to wife's car in stable condition. Pt in possession of d/c instructions, script, and all personal belongings.

## 2016-07-23 NOTE — Progress Notes (Signed)
Spanish interpreter [phone] used with Dr Marland McalpineSheikh on morning rounds.

## 2016-07-23 NOTE — Discharge Summary (Signed)
Physician Discharge Summary  [name] [name] ZOX:[id number] DOB: 1981/04/01 DOA: 07/15/2016  PCP: Jaclyn Shaggy, MD  Admit date: 07/15/2016 Discharge date: 07/23/2016  Admitted From: Home Disposition:  Home  Recommendations for Outpatient Follow-up:  1. Follow up with PCP in 1-2 weeks 2. Follow up with Urology as an outpatient 3. 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 On 07/08/2016 patient underwent a robotic assisted laparoscopic left dismembered pyeloplastyalso had an insertion of left ureteral stent he was able to be discharged to home in good health but continued to have left scrotal pain at first which was mild. Approximately 2 Days prior to admission he developed pain that isconstant and throbbing he also had associated low-grade temperature continued to have good urine output But the day before admission he developed severe left flank pain and hematuria. Pain is severe radiating to the testicles on the left side as well as left flank. CT Abd/Pelvis was done and showing: Diffuse hemorrhage feeling markedly dilated left renal calyces and left renal pelvis origin free fluid in the left lower quadrant free fluid in the pelvis possible urine leak mildly decreased enhancement of the left side of caution kidney worrisome for mild disruption of the blood supply. Patient was admitted to SDU because of Sepsis and PCCM was notified. Urology and Interventional  Radiology consulted and are following. Patient underwent CT guided catheter/drain placement on 1/13. Patient tolerated procedure well and vital signs slowly returned to normal. H/H stabilized and Cr improved. He is improving and had some serosanguinous drainage from drain. Foley Catheter was removed by Urology and repeat CT of Abdomen and Pelvis was recommended by Interventional Radiology prior to removing drain. CT Scan of Abdomen reviewed and discussed with Urology and IR and drain was removed this Am. Urology cleared the patient for discharge and recommended discharging on 1 more week of Abx. Patient will follow up with PCP and Urology as an outpatient as he was deemed medically stable to be D/C'd Home.   Discharge Diagnoses:  Active Problems:   Hypertension   Hyperlipidemia   UPJ (ureteropelvic junction) obstruction   UTI (urinary tract infection)   Hyponatremia   Anemia   Sepsis (HCC)  Sepsis 2/2 to UTI and Renal Hemorrhage with Urine Leak poA -Sepsis Physiology is improved and essentially resolved -U/A on 07/15/2016 showed Many Bacteria, Moderate Leukocytes, Positive Nitrite and TNTC WBC; Urine Cx showed No Growth  -Blood Cx x 2 showed NGTD at 5 Days -Given 30 cc/kg Initially; IVF now D/C'd as patient tolerating po well.  -WBC improved to 14.6 however down from Admission being 24.3; Discussed with Dr. Berneice Heinrich and feels as if it is inflammatory cause from what CT Findings show and less likely infectious -Recently s/p Left Dismembered Robotic Pyeloplasty 07/08/16 with insertion of Left Ureteral Stent for Left UPJ Obstruction in a Horseshoe Kidney -C/w Ciprofloxacin 500 mg po BID and Metronidazole 500 mg q8h for 1 more week  -Drain removed today and outpatient follow up with Dr. Berneice Heinrich with Urology  LLQ Urinoma/Hematoma -CT Scan of Abd/Pelvis done  and showed hemorrhage and associated urine leak -S/p Drain placement by Dr. Grace Isaac on 07/16/16 -JP Creatinine on 07/18/16 was 39.3 consistent with  Urine Leak -Urinoma drainage Gram Stain: ABUNDANT WBC PRESENT, PREDOMINANTLY MONONUCLEAR NO ORGANISMS SEEN; Urinoma Cx showed No Growth 2 days and No Anaerobes Isolated. -Urology and IR following and Managing -Repeat CT Scan reviewed and discussed with Urology and Interventional Radiology: Shown as below  -Hematoma/Urinoma in the Left Pelvis has almost completely resolved and there is no large collection surrounding pigtail catheter; There was a tiny collection of gas just anterior to the pigtail cathether measuring 2.0 x 0.7 cm as well as an ellipitcal collection of fluid just deep to the left pelvic musculature measuring 5.2 x 1.3 not seen previously; *discussed with Dr. Deanne Coffer in Interventional Radiology and is too small to drain -CT Scan also showed there is enhancement of the urothelium as well as associated wall thickening consistent with infection or inflammation. Two pockets of air anterior to the main portion of the renal pelvis are both likely connected to the renal pelvis. The unusual configuration of the renal pelvis is probably due to the recent surgery. There is increasing air in the left renal pelvis with another focus in an upper pole calyx. The left ureteral stent remains in place. There was also is air in the subcutaneous tissues and between the musculature of the left abdominal and pelvic wall, tracking through the inguinal canal into the scrotum. The amount of air has decreased in the interval and is probably postprocedural. The decreasing amount of air would argue against an infectious etiology. In addition there was a tiny focus of free air in the abdomen which has decreased in the interval, likely from recent surgery and Instrumentation.  -Urology not concerned as findings normal for having foley catheter per Dr. Berneice Heinrich;  -Dr. Berneice Heinrich of Urology removed drain and patient cleared to go home.   Renal hemorrhage with a urine leak resulting Hematuria and contributing to Sepsis from  UTI -CT Scan of Abd/Pelvis showed Diffuse hemorrhage filling the markedly dilated left renal calyces and left renal pelvis, which may arise within a pocket of fluid directly medial to the proximal left ureter. Additional hemorrhage and free fluid tracking inferiorly along the left lower quadrant, with free fluid in the pelvis, concerning for associated urine leak. Minimal underlying soft tissue air is thought to be postoperative in nature.  Mildly decreased enhancement of the left side of the horseshoe kidney, concerning for mild disruption of blood supply to the left kidney. Left ureteral stent noted in expected position. Diffuse soft tissue air along the left anterior abdominal wall, tracking to the left side of the scrotum. This appears reflect the patient's recent laparoscopic surgery. -Urology, IR and PCCM consulted and appreciated all their recc's during hospitalization - Hb/Hct Stable at 9.2/28.2 - Heart healthy diet- patient tolerating this well - Kidney function WNL: Last BUN/Cr was 16/0.83 -Urinoma drainage Gram Stain: ABUNDANT WBC PRESENT, PREDOMINANTLY MONONUCLEAR NO ORGANISMS SEEN -C/w Ciprofloxacin 500 mg po BID and Metronidazole 500 mg q8h as Discussed with Dr. Berneice Heinrich and recommends 1 more week of Abx after Drain Removal -Repeat CT Scan of Abd/Pelvis results as above; Dr. Berneice Heinrich to follow as an outpatient   Hyperlipidemia -C/w Home Statin of Atorvastatin 10 mg po Daily  Hypertension  -Resume Home BP Meds  -Continue to Monitor Closely  UTI (urinary tract infection)  -Urine culture showing No Growth -C/w Cipro and Flagyl for 1 week  -As above  Hyponatremia, improved -  Was 139 this AM -Likely attributed to dehydration -Continue to Monitor and Repeat as an outpatient   Anemiasuspect ABL 2/2 to Postoperative bleeding/Renal Hemorrhage -Stable -Repeat CBC as an outpatient   Discharge Instructions  Discharge Instructions    Call MD for:  difficulty breathing, headache or  visual disturbances    Complete by:  As directed    Call MD for:  persistant dizziness or light-headedness    Complete by:  As directed    Call MD for:  persistant nausea and vomiting    Complete by:  As directed    Call MD for:  severe uncontrolled pain    Complete by:  As directed    Call MD for:  temperature >100.4    Complete by:  As directed    Diet - low sodium heart healthy    Complete by:  As directed    Discharge instructions    Complete by:  As directed    Follow Up with Regular PCP and Urology as an outpatient. Take all medications as prescribed. If symptoms change or worsen please come back to ER for evaluation.   Increase activity slowly    Complete by:  As directed      Allergies as of 07/23/2016      Reactions   Doxycycline Rash      Medication List    STOP taking these medications   tamsulosin 0.4 MG Caps capsule Commonly known as:  FLOMAX     TAKE these medications   atorvastatin 10 MG tablet Commonly known as:  LIPITOR Take 1 tablet (10 mg total) by mouth daily.   ciprofloxacin 500 MG tablet Commonly known as:  CIPRO Take 1 tablet (500 mg total) by mouth 2 (two) times daily. Notes to patient:  cada 12 horas   cyclobenzaprine 10 MG tablet Commonly known as:  FLEXERIL Take 1 tablet (10 mg total) by mouth 3 (three) times daily as needed for muscle spasms. Notes to patient:  cada 8 horas segn sea necesario para el dolor muscular   lisinopril-hydrochlorothiazide 10-12.5 MG tablet Commonly known as:  PRINZIDE,ZESTORETIC Take 1 tablet by mouth daily.   metroNIDAZOLE 500 MG tablet Commonly known as:  FLAGYL Take 1 tablet (500 mg total) by mouth every 8 (eight) hours. Notes to patient:  cada 8 horas   oxyCODONE-acetaminophen 5-325 MG tablet Commonly known as:  ROXICET Take 1-2 tablets by mouth every 6 (six) hours as needed for severe pain. What changed:  when to take this      Follow-up Information    Sebastian Ache, MD Follow up.   Specialty:   Urology Why:  as previously scheduled for MD visit.  Contact information: [address] [place] [address] [phone]        Jaclyn Shaggy, MD. Call in 1 week(s).   Specialty:  Family Medicine Why:  Follow Up witihin 1 week for Hospital Follow Up and repeat Blood Work. Contact information: [address] [place] [address] [phone]          Allergies  Allergen Reactions  . Doxycycline Rash    Consultations: -Urology -PCCM -Interventional Radiology  Procedures/Studies: Dg Abd 1 View  Result Date: 07/08/2016 CLINICAL DATA:  Left-sided cystoscopy with stenting. EXAM: ABDOMEN - 1 VIEW COMPARISON:  05/06/2016 abdominal CT FINDINGS: Left retrograde ureteropyelogram shows stenosis in the expected region of the UPJ (low from horseshoe kidney), with contrast dilution in the patulous renal pelvis. Subsequently a ureteral stent was placed. IMPRESSION: Left UPJ  stenosis status post stenting. Electronically Signed   By: Marnee Spring M.D.   On: 07/08/2016 12:41   Ct Abdomen Pelvis W Contrast  Addendum Date: 07/21/2016   ADDENDUM REPORT: 07/21/2016 19:50 ADDENDUM: These results will be called to the ordering clinician or representative by the Radiologist Assistant, and communication documented in the PACS or zVision Dashboard. Electronically Signed   By: Gerome Sam III M.D   On: 07/21/2016 19:50   Result Date: 07/21/2016 CLINICAL DATA:  The patient has a horseshoe kidney. The patient had a pyeloplasty of the left renal pelvis July 08, 2016. A urine leak and hematoma developed and a drain was placed July 16, 2016. Follow-up. EXAM: CT ABDOMEN AND PELVIS WITH CONTRAST TECHNIQUE: Multidetector CT imaging of the abdomen and pelvis was performed using the standard protocol following bolus administration of intravenous contrast. CONTRAST:  ISOVUE-300 IOPAMIDOL (ISOVUE-300) INJECTION 61% COMPARISON:  May 06, 2016, July 15, 2016, July 16, 2016 FINDINGS:  Lower chest: There is a tiny left pleural effusion. The lung bases are otherwise normal. Hepatobiliary: The gallbladder is decompressed limiting evaluation. The portal vein and liver are normal. Pancreas: Unremarkable. No pancreatic ductal dilatation or surrounding inflammatory changes. Spleen: Normal in size without focal abnormality. Adrenals/Urinary Tract: The adrenal glands are normal. The patient has a horseshoe kidney. The right moiety is normal in appearance with no obstruction. The left hydronephrosis has significantly improved but there is enhancement of the renal pelvis urothelium with thickening of the pelvic wall and adjacent stranding. There is increasing air within the left renal pelvis as well. The small fluid collection anterior to the medial aspect of the left renal pelvis described on the July 15, 2016 study connects to the renal pelvis and is also inflamed in appearance. Another collection of air anterior to the lateral aspect of the left renal pelvis is also thought to be connected to the renal pelvis and is filled with air. There is increased attenuation of fat extending down the left pericolic gutter into the left side of the pelvis. The previously identified hematoma/urinoma has almost completely resolved with only minimal fluid remaining. However, there is an elliptical fluid collection just deep to the pelvis wall on series 3, image 67 which will be more fully described below. There is a left ureteral stent extending from the left renal pelvis into the bladder. The bladder is decompressed but unremarkable. The blood products in the left renal pelvis have almost completely resolved. Enhancement of the left kidney has improved and is only slightly less than enhancement on the right. Stomach/Bowel: The stomach and small bowel are normal. The colon is normal. The visualized appendix is unremarkable. Vascular/Lymphatic: No significant vascular findings are present. No enlarged abdominal or  pelvic lymph nodes. Reproductive: Prostate is unremarkable. Other: There is a focus of free air in the abdomen on series 3, image 73 which is actually decreased since July 15, 2016. This could have been introduced during the recent surgery for drain placement. There is subcutaneous air in the left side of the abdomen which has decreased since July 15, 2016 but remains. This air extends into the left inguinal canal and scrotum, also decreased in the interval. This is also likely from recent surgery/instrumentation. The collection of urine and blood in the left lower on the previous study has markedly improved in the interval. There is still increased attenuation in the fat but no large collection remains around the drainage catheter. There is a tiny collection of fluid and air just anterior  to the pigtail catheter measuring 2.0 x 0.7 cm. There is an elliptical fluid collection just deep to the pelvic musculature on series 3, image 67 which was not seen previously measuring 5.2 x 1.3 cm. This could represent either a sterile or infected collection. This could represent developing abscess, urinoma, or evolving blood products. No other drainable collections of fluid are identified. Musculoskeletal: No acute or significant osseous findings. IMPRESSION: 1. The hematoma in the left renal pelvis on the previous study has almost completely resolved. There is less dilatation as well. However, there is enhancement of the urothelium as well as associated wall thickening consistent with infection or inflammation. Two pockets of air anterior to the main portion of the renal pelvis are both likely connected to the renal pelvis. The unusual configuration of the renal pelvis is probably due to the recent surgery. There is increasing air in the left renal pelvis with another focus in an upper pole calyx. The increasing air is concerning for infection as I am unaware of any interval instrumentation which may have introduced air  in the interval. Recommend clinical correlation. The left ureteral stent remains in place. 2. The previously identified hematoma/urinoma in the left pelvis has almost completely resolved and there is no large collection surrounding the pigtail catheter. There is a tiny collection of gas and air just anterior to the pigtail catheter measuring 2.0 x 0.7 cm on series 3, image 73. There is also an elliptical collection of fluid just deep to the left pelvic musculature measuring 5.2 x 1.3 cm, not seen previously. Whether this is infected or sterile is unclear. 3. There is air in the subcutaneous tissues and between the musculature of the left abdominal and pelvic wall, tracking through the inguinal canal into the scrotum. The amount of air has decreased in the interval and is probably postprocedural. The decreasing amount of air would argue against an infectious etiology. Recommend clinical correlation. 4. There is a tiny focus of free air in the abdomen which has decreased in the interval, likely from recent surgery and instrumentation. Electronically Signed: By: Gerome Sam III M.D On: 07/21/2016 19:43   Ct Abdomen Pelvis W Contrast  Result Date: 07/15/2016 CLINICAL DATA:  Acute onset of right flank pain and hematuria. Status post pyeloplasty. Initial encounter. EXAM: CT ABDOMEN AND PELVIS WITH CONTRAST TECHNIQUE: Multidetector CT imaging of the abdomen and pelvis was performed using the standard protocol following bolus administration of intravenous contrast. CONTRAST:  ISOVUE-300 IOPAMIDOL (ISOVUE-300) INJECTION 61% COMPARISON:  CT of the abdomen and pelvis from 05/06/2016 FINDINGS: Lower chest: The visualized lung bases are grossly clear. The visualized portions of the mediastinum are unremarkable. Hepatobiliary: The liver is unremarkable in appearance. The gallbladder is unremarkable in appearance. The common bile duct remains normal in caliber. Pancreas: The pancreas is within normal limits. Spleen:  The spleen is unremarkable in appearance. Adrenals/Urinary Tract: The adrenal glands are unremarkable in appearance. There is diffuse hemorrhage filling the markedly dilated left renal calyces and left renal pelvis, which may arise within a pocket of fluid directly medial to the proximal left ureter. Additional hemorrhage and free fluid are seen tracking inferiorly along the left lower quadrant, with free fluid seen in the pelvis, concerning for associated urine leak. Minimal underlying soft tissue air is thought to be postoperative in nature. The left ureteral stent is noted in expected position. There is mildly decreased enhancement of the left side of the horseshoe kidney, concerning for mild disruption of blood supply to the  left kidney. The right side of the horseshoe kidney is unremarkable in appearance. Stomach/Bowel: The stomach is unremarkable in appearance. The small bowel is within normal limits. The appendix is not visualized; there is no evidence for appendicitis. The colon is unremarkable in appearance. Mild stranding is noted about the descending colon, reflecting the adjacent hemorrhage and fluid tracking inferiorly from the left side of the horseshoe kidney. Vascular/Lymphatic: The abdominal aorta is unremarkable in appearance. The inferior vena cava is grossly unremarkable. No retroperitoneal lymphadenopathy is seen. No pelvic sidewall lymphadenopathy is identified. Reproductive: The bladder is mildly distended but otherwise unremarkable. The prostate remains normal in size. Other: Diffuse soft tissue air is noted along the left anterior abdominal wall, tracking into the left side of the scrotum. This appears to reflect the patient's recent laparoscopic surgery. Musculoskeletal: No acute osseous abnormalities are identified. The visualized musculature is unremarkable in appearance. IMPRESSION: 1. Diffuse hemorrhage filling the markedly dilated left renal calyces and left renal pelvis, which may  arise within a pocket of fluid directly medial to the proximal left ureter. Additional hemorrhage and free fluid tracking inferiorly along the left lower quadrant, with free fluid in the pelvis, concerning for associated urine leak. Minimal underlying soft tissue air is thought to be postoperative in nature. 2. Mildly decreased enhancement of the left side of the horseshoe kidney, concerning for mild disruption of blood supply to the left kidney. Left ureteral stent noted in expected position. 3. Diffuse soft tissue air along the left anterior abdominal wall, tracking to the left side of the scrotum. This appears reflect the patient's recent laparoscopic surgery. These results were called by telephone at the time of interpretation on 07/15/2016 at 8:29 pm to Dr. Lyndal Pulley, who verbally acknowledged these results. Electronically Signed   By: Roanna Raider M.D.   On: 07/15/2016 20:30   Ct Image Guided Drainage By Percutaneous Catheter  Result Date: 07/16/2016 INDICATION: History of horseshoe kidney, post left-sided pyloroplasty and left-sided double-J ureteral stent placement, admitted with abdominal pain and fever found to have a serpiginous fluid collection within the left pericolic gutter worrisome for a urinoma. Please perform CT-guided percutaneous drainage catheter placement. EXAM: CT IMAGE GUIDED DRAINAGE BY PERCUTANEOUS CATHETER COMPARISON:  CT abdomen pelvis - 07/15/2016; 05/06/2016 MEDICATIONS: The patient is currently admitted to the hospital and receiving intravenous antibiotics. The antibiotics were administered within an appropriate time frame prior to the initiation of the procedure. ANESTHESIA/SEDATION: Moderate (conscious) sedation was employed during this procedure. A total of Versed 1 mg and Fentanyl 100 mcg was administered intravenously. Moderate Sedation Time: 19 minutes. The patient's level of consciousness and vital signs were monitored continuously by radiology nursing throughout the  procedure under my direct supervision. CONTRAST:  None COMPLICATIONS: None immediate. PROCEDURE: Informed written consent was obtained from the patient (via the use of a medical translator) after a discussion of the risks, benefits and alternatives to treatment. The patient was placed supine on the CT gantry and a pre procedural CT was performed re-demonstrating the known abscess/fluid collection within the left hemiabdomen/pelvis with dominant component measuring approximately 8.6 x 6.7 cm (image 62, series 3). Note is also made of persistent enhancement involving the left sided moiety of the horseshoe kidney (representative image 40, series 3). The procedure was planned. A timeout was performed prior to the initiation of the procedure. The skin overlying the left inferolateral abdomen was prepped and draped in the usual sterile fashion. The overlying soft tissues were anesthetized with 1% lidocaine with epinephrine. Appropriate  trajectory was planned with the use of a 22 gauge spinal needle. An 18 gauge trocar needle was advanced into the abscess/fluid collection and a short Amplatz super stiff wire was coiled within the collection. Appropriate positioning was confirmed with a limited CT scan. The tract was serially dilated allowing placement of a 10 [place] all-purpose drainage catheter. Appropriate positioning was confirmed with a limited postprocedural CT scan. Approximately 300 cc of bloody was aspirated. The tube was connected to a drainage bag and sutured in place. A dressing was placed. The patient tolerated the procedure well without immediate post procedural complication. IMPRESSION: 1. 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 Samples were sent to the laboratory as requested by the ordering clinical team. 2. Persistent contrast enhancement of the left-sided  moiety of the horseshoe kidney, likely secondary to delayed excretion given the presence of large volume of clot and hemorrhage within the left collecting system. Electronically Signed   By: Simonne Come M.D.   On: 07/16/2016 11:50   Subjective: Seen and examined at bedside with Nurse and Spanish interpreter Araceli [phone] and he was doing well. Discussed about follow up as an out patient and patient understood. No acute complaints and urinating without problems.  Discharge Exam: Vitals:   07/22/16 2218 07/23/16 0532  BP: 114/62 117/64  Pulse: 86 73  Resp: 18 20  Temp: 98.3 F (36.8 C) 98.6 F (37 C)   Vitals:   07/22/16 0613 07/22/16 1300 07/22/16 2218 07/23/16 0532  BP: 110/60 111/63 114/62 117/64  Pulse: 63 74 86 73  Resp: 15 18 18 20   Temp: 98.2 F (36.8 C) 98.4 F (36.9 C) 98.3 F (36.8 C) 98.6 F (37 C)  TempSrc: Oral Oral Oral Oral  SpO2: 98% 97% 97% 98%  Weight:      Height:       General: Pt is alert, awake, not in acute distress Cardiovascular: RRR, S1/S2 +, no rubs, no gallops Respiratory: CTA bilaterally, no wheezing, no rhonchi Abdominal: Soft, NT, ND, bowel sounds + Extremities: no edema, no cyanosis  The results of significant diagnostics from this hospitalization (including imaging, microbiology, ancillary and laboratory) are listed below for reference.     Microbiology: Recent Results (from the past 240 hour(s))  Urine culture     Status: None   Collection Time: 07/15/16  5:30 PM  Result Value Ref Range Status   Specimen Description URINE, CLEAN CATCH  Final   Special Requests NONE  Final   Culture NO GROWTH Performed at [facility]   Final   Report Status 07/17/2016 FINAL  Final  Blood culture (routine x 2)     Status: None   Collection Time: 07/15/16  8:34 PM  Result Value Ref Range Status   Specimen Description BLOOD RIGHT ANTECUBITAL  Final   Special Requests BOTTLES DRAWN AEROBIC AND ANAEROBIC 5CC EACH  Final   Culture   Final    NO  GROWTH 5 DAYS Performed at [facility], [address] [place], [place] [address]    Report Status 07/21/2016 FINAL  Final  Blood culture (routine x 2)     Status: None   Collection Time: 07/15/16  8:43 PM  Result Value Ref Range Status   Specimen Description BLOOD LEFT ANTECUBITAL  Final   Special Requests BOTTLES DRAWN AEROBIC AND ANAEROBIC [facility] EACH  Final  Culture   Final    NO GROWTH 5 DAYS Performed at [facility], [address] [place], [place] [address]    Report Status 07/21/2016 FINAL  Final  MRSA PCR Screening     Status: None   Collection Time: 07/16/16  2:47 AM  Result Value Ref Range Status   MRSA by PCR NEGATIVE NEGATIVE Final    Comment:        The GeneXpert MRSA Assay (FDA approved for NASAL specimens only), is one component of a comprehensive MRSA colonization surveillance program. It is not intended to diagnose MRSA infection nor to guide or monitor treatment for MRSA infections.   Aerobic/Anaerobic Culture (surgical/deep wound)     Status: None   Collection Time: 07/16/16 11:13 AM  Result Value Ref Range Status   Specimen Description DRAINAGE URINOMA  Final   Special Requests Normal  Final   Gram Stain   Final    ABUNDANT WBC PRESENT, PREDOMINANTLY MONONUCLEAR NO ORGANISMS SEEN    Culture   Final    No growth aerobically or anaerobically. Performed at [facility], [address] [place], [place] [address]    Report Status 07/21/2016 FINAL  Final     Labs: BNP (last 3 results) No results for input(s): BNP in the last 8760 hours. Basic Metabolic Panel:  Recent Labs Lab 07/19/16 0849 07/20/16 1349 07/21/16 0518 07/22/16 0454 07/23/16 0522  NA 135 138 140 139 139  K 4.2 4.0 4.0 3.9 3.7  CL 101 105 106 107 107  CO2 24 25 25 24 25   GLUCOSE 98 149* 103* 101* 113*  BUN 19 19 21* 17 16  CREATININE 0.84 0.94 0.75 0.83 0.83  CALCIUM 8.7* 8.9 9.0 8.8* 8.6*  MG  --  2.1 2.0 1.9 1.8  PHOS  --  3.6 4.1 4.7* 3.9   Liver  Function Tests:  Recent Labs Lab 07/20/16 1349 07/21/16 0518 07/22/16 0454 07/23/16 0522  AST 44* 34 27 24  ALT 98* 84* 68* 54  ALKPHOS 345* 317* 248* 226*  BILITOT 0.5 0.6 0.5 0.6  PROT 7.1 7.1 6.9 6.5  ALBUMIN 3.0* 3.1* 3.0* 3.0*   No results for input(s): LIPASE, AMYLASE in the last 168 hours. No results for input(s): AMMONIA in the last 168 hours. CBC:  Recent Labs Lab 07/19/16 0849 07/20/16 1349 07/21/16 0518 07/22/16 0454 07/23/16 0522  WBC 16.4* 14.5* 17.2* 14.5* 14.6*  NEUTROABS  --  11.4* 13.2* 10.3* 10.7*  HGB 9.0* 9.0* 8.9* 8.9* 9.2*  HCT 26.6* 27.2* 27.1* 27.1* 28.2*  MCV 85.0 86.3 86.6 87.1 87.9  PLT 529* 555* 578* 594* 580*   Cardiac Enzymes: No results for input(s): CKTOTAL, CKMB, CKMBINDEX, TROPONINI in the last 168 hours. BNP: Invalid input(s): POCBNP CBG: No results for input(s): GLUCAP in the last 168 hours. D-Dimer No results for input(s): DDIMER in the last 72 hours. Hgb A1c No results for input(s): HGBA1C in the last 72 hours. Lipid Profile No results for input(s): CHOL, HDL, LDLCALC, TRIG, CHOLHDL, LDLDIRECT in the last 72 hours. Thyroid function studies No results for input(s): TSH, T4TOTAL, T3FREE, THYROIDAB in the last 72 hours.  Invalid input(s): FREET3 Anemia work up No results for input(s): VITAMINB12, FOLATE, FERRITIN, TIBC, IRON, RETICCTPCT in the last 72 hours. Urinalysis    Component Value Date/Time   COLORURINE RED (A) 07/15/2016 1730   APPEARANCEUR TURBID (A) 07/15/2016 1730   LABSPEC 1.025 07/15/2016 1730   PHURINE 6.5 07/15/2016 1730   GLUCOSEU  100 (A) 07/15/2016 1730   HGBUR LARGE (A) 07/15/2016 1730   BILIRUBINUR LARGE (A) 07/15/2016 1730   KETONESUR 15 (A) 07/15/2016 1730   PROTEINUR >=300 (A) 07/15/2016 1730   NITRITE POSITIVE (A) 07/15/2016 1730   LEUKOCYTESUR MODERATE (A) 07/15/2016 1730   Sepsis Labs Invalid input(s): PROCALCITONIN,  WBC,  LACTICIDVEN Microbiology Recent Results (from the past 240  hour(s))  Urine culture     Status: None   Collection Time: 07/15/16  5:30 PM  Result Value Ref Range Status   Specimen Description URINE, CLEAN CATCH  Final   Special Requests NONE  Final   Culture NO GROWTH Performed at [facility]   Final   Report Status 07/17/2016 FINAL  Final  Blood culture (routine x 2)     Status: None   Collection Time: 07/15/16  8:34 PM  Result Value Ref Range Status   Specimen Description BLOOD RIGHT ANTECUBITAL  Final   Special Requests BOTTLES DRAWN AEROBIC AND ANAEROBIC 5CC EACH  Final   Culture   Final    NO GROWTH 5 DAYS Performed at [facility], [address] [place], [place] [address]    Report Status 07/21/2016 FINAL  Final  Blood culture (routine x 2)     Status: None   Collection Time: 07/15/16  8:43 PM  Result Value Ref Range Status   Specimen Description BLOOD LEFT ANTECUBITAL  Final   Special Requests BOTTLES DRAWN AEROBIC AND ANAEROBIC 5CC EACH  Final   Culture   Final    NO GROWTH 5 DAYS Performed at [facility], [address] [place], [place] [address]    Report Status 07/21/2016 FINAL  Final  MRSA PCR Screening     Status: None   Collection Time: 07/16/16  2:47 AM  Result Value Ref Range Status   MRSA by PCR NEGATIVE NEGATIVE Final    Comment:        The GeneXpert MRSA Assay (FDA approved for NASAL specimens only), is one component of a comprehensive MRSA colonization surveillance program. It is not intended to diagnose MRSA infection nor to guide or monitor treatment for MRSA infections.   Aerobic/Anaerobic Culture (surgical/deep wound)     Status: None   Collection Time: 07/16/16 11:13 AM  Result Value Ref Range Status   Specimen Description DRAINAGE URINOMA  Final   Special Requests Normal  Final   Gram Stain   Final    ABUNDANT WBC PRESENT, PREDOMINANTLY MONONUCLEAR NO ORGANISMS SEEN    Culture   Final    No growth aerobically or anaerobically. Performed at [facility], [address], [place], [place] [address]    Report Status 07/21/2016 FINAL  Final   Time coordinating discharge: Over 30 minutes  SIGNED:  Merlene Laughter, DO [facility] 07/23/2016, 8:43 PM Pager [phone]  If 7PM-7AM, please contact night-coverage [email or web address] Password [email or web address]

## 2016-07-23 NOTE — Progress Notes (Signed)
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   2 - Left Perinephric / Pelvic Fluid Collection - new perinephric and psoas/ pelvic fluid collection by admit CT 07/15/16 and s/p IR drain 1/13. 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 JP Cr 1/15 39 c/w some urine extrav.  1/15 - approx 75% volume via foley / 25% drain 1/16 - approx 90% volume via foley / 10% drain 1/17 - 98% volume via foley / 2% drain 1/18 - Foley removed, CT with nearly completely resolved fluid collection and sig improved hydro,  1/19 - <5130mL drain (remains serous) 1/20 - IR drain removed  3 - Fevers / Leukocytosis - fevers, bacteruria, leukocytosis on admit 1/12 c/w possible cystitis and or left pyelonephritis. UCX 1/12 negative (after ABX given), CX from left pelvic fluid collection 1/13  negative. Initially on Vanc + Zosyn per pharmacy then narrowed to Cipro / Flagyl.  Today "[name]" is w/o complaints, wants to go home. No fevers for over 48 hrs and drain output remains almost nil.   Objective: Vital signs in last 24 hours: Temp:  [98.3 F (36.8 C)-98.6 F (37 C)] 98.6 F (37 C) (01/20 0532) Pulse Rate:  [73-86] 73 (01/20 0532) Resp:  [18-20] 20 (01/20 0532) BP: (111-117)/(62-64) 117/64 (01/20 0532) SpO2:  [97 %-98 %] 98 % (01/20 0532) Last BM Date: 07/19/16  Intake/Output from previous day: 01/19 0701 - 01/20 0700 In: 10  Out: 1465 [Urine:1450; Drains:15] Intake/Output this shift: No intake/output data recorded.  General appearance: alert, cooperative, appears stated age and interviewed wtih languange interpreter via [employer] link Eyes: negative Nose: Nares normal. Septum midline. Mucosa normal. No drainage or sinus  tenderness. Throat: lips, mucosa, and tongue normal; teeth and gums normal Neck: supple, symmetrical, trachea midline Back: symmetric, no curvature. ROM normal. No CVA tenderness. Resp: non-labored on room air.  Cardio: Nl rate GI: soft, non-tender; bowel sounds normal; no masses,  no organomegaly and recent surgical sites c/d/i.  Extremities: extremities normal, atraumatic, no cyanosis or edema Pulses: 2+ and symmetric Lymph nodes: Cervical, supraclavicular, and axillary nodes normal. Neurologic: Grossly normal Incision/Wound: IR drain with scant serous drainage that is non-foul. Removed and dry dressing applied.   Lab Results:   Recent Labs  07/22/16 0454 07/23/16 0522  WBC 14.5* 14.6*  HGB 8.9* 9.2*  HCT 27.1* 28.2*  PLT 594* 580*   BMET  Recent Labs  07/22/16 0454 07/23/16 0522  NA 139 139  K 3.9 3.7  CL 107 107  CO2 24 25  GLUCOSE 101* 113*  BUN 17 16  CREATININE 0.83 0.83  CALCIUM 8.8* 8.6*   PT/INR No results for input(s): LABPROT, INR in the last 72 hours. ABG No results for input(s): PHART, HCO3 in the last 72 hours.  Invalid input(s): PCO2, PO2  Studies/Results: Ct Abdomen Pelvis W Contrast  Addendum Date: 07/21/2016   ADDENDUM REPORT: 07/21/2016 19:50 ADDENDUM: These results will be called to the ordering clinician or representative by the Radiologist Assistant, and communication documented in the PACS or zVision Dashboard. Electronically Signed   By: Gerome Samavid  Williams III M.D   On: 07/21/2016 19:50   Result Date: 07/21/2016 CLINICAL DATA:  The patient has a horseshoe kidney. The patient had a pyeloplasty of the left renal pelvis  July 08, 2016. A urine leak and hematoma developed and a drain was placed July 16, 2016. Follow-up. EXAM: CT ABDOMEN AND PELVIS WITH CONTRAST TECHNIQUE: Multidetector CT imaging of the abdomen and pelvis was performed using the standard protocol following bolus administration of intravenous contrast. CONTRAST:   ISOVUE-300 IOPAMIDOL (ISOVUE-300) INJECTION 61% COMPARISON:  May 06, 2016, July 15, 2016, July 16, 2016 FINDINGS: Lower chest: There is a tiny left pleural effusion. The lung bases are otherwise normal. Hepatobiliary: The gallbladder is decompressed limiting evaluation. The portal vein and liver are normal. Pancreas: Unremarkable. No pancreatic ductal dilatation or surrounding inflammatory changes. Spleen: Normal in size without focal abnormality. Adrenals/Urinary Tract: The adrenal glands are normal. The patient has a horseshoe kidney. The right moiety is normal in appearance with no obstruction. The left hydronephrosis has significantly improved but there is enhancement of the renal pelvis urothelium with thickening of the pelvic wall and adjacent stranding. There is increasing air within the left renal pelvis as well. The small fluid collection anterior to the medial aspect of the left renal pelvis described on the July 15, 2016 study connects to the renal pelvis and is also inflamed in appearance. Another collection of air anterior to the lateral aspect of the left renal pelvis is also thought to be connected to the renal pelvis and is filled with air. There is increased attenuation of fat extending down the left pericolic gutter into the left side of the pelvis. The previously identified hematoma/urinoma has almost completely resolved with only minimal fluid remaining. However, there is an elliptical fluid collection just deep to the pelvis wall on series 3, image 67 which will be more fully described below. There is a left ureteral stent extending from the left renal pelvis into the bladder. The bladder is decompressed but unremarkable. The blood products in the left renal pelvis have almost completely resolved. Enhancement of the left kidney has improved and is only slightly less than enhancement on the right. Stomach/Bowel: The stomach and small bowel are normal. The colon is normal. The  visualized appendix is unremarkable. Vascular/Lymphatic: No significant vascular findings are present. No enlarged abdominal or pelvic lymph nodes. Reproductive: Prostate is unremarkable. Other: There is a focus of free air in the abdomen on series 3, image 73 which is actually decreased since July 15, 2016. This could have been introduced during the recent surgery for drain placement. There is subcutaneous air in the left side of the abdomen which has decreased since July 15, 2016 but remains. This air extends into the left inguinal canal and scrotum, also decreased in the interval. This is also likely from recent surgery/instrumentation. The collection of urine and blood in the left lower on the previous study has markedly improved in the interval. There is still increased attenuation in the fat but no large collection remains around the drainage catheter. There is a tiny collection of fluid and air just anterior to the pigtail catheter measuring 2.0 x 0.7 cm. There is an elliptical fluid collection just deep to the pelvic musculature on series 3, image 67 which was not seen previously measuring 5.2 x 1.3 cm. This could represent either a sterile or infected collection. This could represent developing abscess, urinoma, or evolving blood products. No other drainable collections of fluid are identified. Musculoskeletal: No acute or significant osseous findings. IMPRESSION: 1. The hematoma in the left renal pelvis on the previous study has almost completely resolved. There is less dilatation as well. However, there is enhancement of the  urothelium as well as associated wall thickening consistent with infection or inflammation. Two pockets of air anterior to the main portion of the renal pelvis are both likely connected to the renal pelvis. The unusual configuration of the renal pelvis is probably due to the recent surgery. There is increasing air in the left renal pelvis with another focus in an upper pole  calyx. The increasing air is concerning for infection as I am unaware of any interval instrumentation which may have introduced air in the interval. Recommend clinical correlation. The left ureteral stent remains in place. 2. The previously identified hematoma/urinoma in the left pelvis has almost completely resolved and there is no large collection surrounding the pigtail catheter. There is a tiny collection of gas and air just anterior to the pigtail catheter measuring 2.0 x 0.7 cm on series 3, image 73. There is also an elliptical collection of fluid just deep to the left pelvic musculature measuring 5.2 x 1.3 cm, not seen previously. Whether this is infected or sterile is unclear. 3. There is air in the subcutaneous tissues and between the musculature of the left abdominal and pelvic wall, tracking through the inguinal canal into the scrotum. The amount of air has decreased in the interval and is probably postprocedural. The decreasing amount of air would argue against an infectious etiology. Recommend clinical correlation. 4. There is a tiny focus of free air in the abdomen which has decreased in the interval, likely from recent surgery and instrumentation. Electronically Signed: By: Gerome Sam III M.D On: 07/21/2016 19:43    Anti-infectives: Anti-infectives    Start     Dose/Rate Route Frequency Ordered Stop   07/19/16 1400  metroNIDAZOLE (FLAGYL) tablet 500 mg     500 mg Oral Every 8 hours 07/19/16 0828     07/19/16 1000  ciprofloxacin (CIPRO) tablet 500 mg     500 mg Oral 2 times daily 07/19/16 0828     07/16/16 1000  vancomycin (VANCOCIN) IVPB 750 mg/150 ml premix  Status:  Discontinued     750 mg 150 mL/hr over 60 Minutes Intravenous Every 12 hours 07/16/16 0053 07/19/16 0828   07/16/16 0200  piperacillin-tazobactam (ZOSYN) IVPB 3.375 g  Status:  Discontinued     3.375 g 12.5 mL/hr over 240 Minutes Intravenous Every 8 hours 07/16/16 0013 07/19/16 0828   07/15/16 1945  vancomycin  (VANCOCIN) 2,000 mg in sodium chloride 0.9 % 500 mL IVPB     2,000 mg 250 mL/hr over 120 Minutes Intravenous  Once 07/15/16 1938 07/16/16 0700   07/15/16 1945  ceFEPIme (MAXIPIME) 1 g in dextrose 5 % 50 mL IVPB     1 g 100 mL/hr over 30 Minutes Intravenous  Once 07/15/16 1938 07/15/16 2123      Assessment/Plan:  1 - 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   2 - Left Perinephric / Pelvic Fluid Collection - Likely due to relative obstruction from blood / clot in renal pelvis, then subsequent extravasation. This has essentially resolved clinically and by f/u imaging. All drains / tubes now out.   3 - Fevers / Leukocytosis - likely early pyelo by relative obstruction above. I feel is follow up imaging and overall clinical picture is reassuring as no increasing fluid collections / foul drainage. His slowly resolving luekocytosis and persistent thrombocytosis likely remains reactive.   I feel reasonable to continue fluoroquinolone for another 7-10 days or so.  Again, greatly appreciate medical team and IR comanagmeent.   I feel pt OK for discharge at anypoint from GU perspective. He has follow up arranged through [place].   Please call me directly with questions.   [facility], Lenzy Kerschner 07/23/2016

## 2016-07-23 NOTE — Progress Notes (Signed)
Spanish interpreter [phone] used with Dr Berneice HeinrichManny this AM.

## 2016-08-08 ENCOUNTER — Encounter: Payer: Self-pay | Admitting: Family Medicine

## 2016-08-08 ENCOUNTER — Ambulatory Visit: Payer: Self-pay | Attending: Family Medicine | Admitting: Family Medicine

## 2016-08-08 VITALS — BP 114/70 | HR 56 | Temp 98.5°F | Ht 68.0 in | Wt 223.6 lb

## 2016-08-08 DIAGNOSIS — Z881 Allergy status to other antibiotic agents status: Secondary | ICD-10-CM | POA: Insufficient documentation

## 2016-08-08 DIAGNOSIS — E785 Hyperlipidemia, unspecified: Secondary | ICD-10-CM | POA: Insufficient documentation

## 2016-08-08 DIAGNOSIS — I10 Essential (primary) hypertension: Secondary | ICD-10-CM | POA: Insufficient documentation

## 2016-08-08 DIAGNOSIS — N3091 Cystitis, unspecified with hematuria: Secondary | ICD-10-CM | POA: Insufficient documentation

## 2016-08-08 DIAGNOSIS — K649 Unspecified hemorrhoids: Secondary | ICD-10-CM | POA: Insufficient documentation

## 2016-08-08 DIAGNOSIS — K648 Other hemorrhoids: Secondary | ICD-10-CM | POA: Insufficient documentation

## 2016-08-08 DIAGNOSIS — Z79899 Other long term (current) drug therapy: Secondary | ICD-10-CM | POA: Insufficient documentation

## 2016-08-08 DIAGNOSIS — N3001 Acute cystitis with hematuria: Secondary | ICD-10-CM

## 2016-08-08 LAB — POCT URINALYSIS DIPSTICK
BILIRUBIN UA: NEGATIVE
Glucose, UA: NEGATIVE
KETONES UA: NEGATIVE
NITRITE UA: NEGATIVE
PH UA: 7
Protein, UA: NEGATIVE
SPEC GRAV UA: 1.01
Urobilinogen, UA: 0.2

## 2016-08-08 MED ORDER — SULFAMETHOXAZOLE-TRIMETHOPRIM 800-160 MG PO TABS: 1.0000 | ORAL_TABLET | Freq: Two times a day (BID) | ORAL | 0 refills | Status: DC

## 2016-08-08 MED ORDER — HYDROCORTISONE ACE-PRAMOXINE 1-1 % RE CREA: 1.0000 "application " | TOPICAL_CREAM | Freq: Two times a day (BID) | RECTAL | 0 refills | Status: DC

## 2016-08-08 NOTE — Progress Notes (Signed)
Subjective:  Patient ID: [name] [name], male    DOB: 1981/05/14  Age: 36 y.o. MRN: [id number]  CC: Follow-up (surgery) and burning when urinating (since he was catherized)   HPI [name] [name] is a 36 year old male with a history of hypertension, hyperlipidemia who is status post left robotic-assisted pyeloplasty with retrograde pyelogram and stent placement for left UPJ obstruction in a horseshoe kidney (hospitalized 07/08/16 through 07/10/16)  He was again readmitted (07/15/16 through 07/23/16) for sepsis after he presented with fevers; CT abdomen and pelvis revealed diffuse hematoma filling the markedly dilated left renal kidney cyst and left renal pelvis, concern for urine leak  for which he underwent CT-guided catheter/ drain placement with serosanguineous drainage obtained from drain. He was subsequently discharged on a course of ciprofloxacin and metronidazole to follow-up with urology.  He reports completion of his antibiotic but informs me he has dysuria and hematuria which developed 2 days post discharge. He denies fever, abdominal eating. He endorses a history of hemorrhoids and would like some treatment for this. He has an upcoming appointment with urology tomorrow.  Past Medical History:  Diagnosis Date  . Hypertension     Past Surgical History:  Procedure Laterality Date  . CYSTOSCOPY W/ RETROGRADES Left 07/08/2016   Procedure: CYSTOSCOPY WITH RETROGRADE PYELOGRAM AND STENT PLACEMENT;  Surgeon: Sebastian Acheheodore Manny, MD;  Location: [facility];  Service: Urology;  Laterality: Left;  . NO PAST SURGERIES    . ROBOT ASSISTED PYELOPLASTY Left 07/08/2016   Procedure: XI ROBOTIC ASSISTED PYELOPLASTY;  Surgeon: Sebastian Acheheodore Manny, MD;  Location: [facility];  Service: Urology;  Laterality: Left;    Allergies  Allergen Reactions  . Doxycycline Rash     Outpatient Medications Prior to Visit  Medication Sig Dispense Refill  . atorvastatin (LIPITOR) 10 MG tablet Take 1 tablet (10 mg total) by mouth daily.  30 tablet 5  . lisinopril-hydrochlorothiazide (PRINZIDE,ZESTORETIC) 10-12.5 MG tablet Take 1 tablet by mouth daily. 30 tablet 5  . cyclobenzaprine (FLEXERIL) 10 MG tablet Take 1 tablet (10 mg total) by mouth 3 (three) times daily as needed for muscle spasms. (Patient not taking: Reported on 07/15/2016) 30 tablet 0  . oxyCODONE-acetaminophen (ROXICET) 5-325 MG tablet Take 1-2 tablets by mouth every 6 (six) hours as needed for severe pain. (Patient not taking: Reported on 08/08/2016) 15 tablet 0  . ciprofloxacin (CIPRO) 500 MG tablet Take 1 tablet (500 mg total) by mouth 2 (two) times daily. (Patient not taking: Reported on 08/08/2016) 14 tablet 0  . metroNIDAZOLE (FLAGYL) 500 MG tablet Take 1 tablet (500 mg total) by mouth every 8 (eight) hours. 21 tablet 0   No facility-administered medications prior to visit.     ROS Review of Systems  Constitutional: Negative for activity change and appetite change.  HENT: Negative for sinus pressure and sore throat.   Respiratory: Negative for chest tightness, shortness of breath and wheezing.   Cardiovascular: Negative for chest pain and palpitations.  Gastrointestinal: Negative for abdominal distention, abdominal pain and constipation.  Genitourinary: Positive for dysuria.  Musculoskeletal: Negative.   Psychiatric/Behavioral: Negative for behavioral problems and dysphoric mood.    Objective:  BP 114/70 (BP Location: Right Arm, Patient Position: Sitting, Cuff Size: Large)   Pulse (!) 56   Temp 98.5 F (36.9 C) (Oral)   Ht 5\' 8"  (1.727 m)   Wt 223 lb 9.6 oz (101.4 kg)   SpO2 100%   BMI 34.00 kg/m   BP/Weight 08/08/2016 07/23/2016 07/17/2016  Systolic BP 114 117 -  Diastolic BP 70 64 -  Wt. (Lbs) 223.6 - 229.5  BMI 34 - 35.94       Physical Exam  Constitutional: He is oriented to person, place, and time. He appears well-developed and well-nourished.  Cardiovascular: Normal heart sounds and intact distal pulses.  Bradycardia present.   No murmur  heard. Pulmonary/Chest: Effort normal and breath sounds normal. He has no wheezes. He has no rales. He exhibits no tenderness.  Abdominal: Soft. Bowel sounds are normal.  Laparascopic scars from surgery  Musculoskeletal: Normal range of motion.  Neurological: He is alert and oriented to person, place, and time.    Urinalysis    Component Value Date/Time   COLORURINE RED (A) 07/15/2016 1730   APPEARANCEUR TURBID (A) 07/15/2016 1730   LABSPEC 1.025 07/15/2016 1730   PHURINE 6.5 07/15/2016 1730   GLUCOSEU 100 (A) 07/15/2016 1730   HGBUR LARGE (A) 07/15/2016 1730   BILIRUBINUR negative 08/08/2016 1058   KETONESUR 15 (A) 07/15/2016 1730   PROTEINUR negative 08/08/2016 1058   PROTEINUR >=300 (A) 07/15/2016 1730   UROBILINOGEN 0.2 08/08/2016 1058   NITRITE negative 08/08/2016 1058   NITRITE POSITIVE (A) 07/15/2016 1730   LEUKOCYTESUR small (1+) (A) 08/08/2016 1058      Assessment & Plan:   1. Acute cystitis with hematuria - Urinalysis Dipstick - sulfamethoxazole-trimethoprim (BACTRIM DS,SEPTRA DS) 800-160 MG tablet; Take 1 tablet by mouth 2 (two) times daily.  Dispense: 20 tablet; Refill: 0  2. Other hemorrhoids Advised to increase fiber intake, fruits and vegetables and water. - pramoxine-hydrocortisone (ANALPRAM-HC) 1-1 % rectal cream; Place 1 application rectally 2 (two) times daily.  Dispense: 30 g; Refill: 0   Meds ordered this encounter  Medications  . pramoxine-hydrocortisone (ANALPRAM-HC) 1-1 % rectal cream    Sig: Place 1 application rectally 2 (two) times daily.    Dispense:  30 g    Refill:  0  . sulfamethoxazole-trimethoprim (BACTRIM DS,SEPTRA DS) 800-160 MG tablet    Sig: Take 1 tablet by mouth 2 (two) times daily.    Dispense:  20 tablet    Refill:  0    Follow-up: Return in about 3 months (around 11/05/2016) for Follow-up of hypertension.   Jaclyn Shaggy MD

## 2016-10-21 ENCOUNTER — Encounter (HOSPITAL_COMMUNITY): Payer: Self-pay | Admitting: *Deleted

## 2016-10-21 ENCOUNTER — Emergency Department (HOSPITAL_COMMUNITY)
Admission: EM | Admit: 2016-10-21 | Discharge: 2016-10-21 | Disposition: A | Discharge status: Home / Self Care | Payer: Self-pay | Attending: Emergency Medicine | Admitting: Emergency Medicine

## 2016-10-21 DIAGNOSIS — I10 Essential (primary) hypertension: Secondary | ICD-10-CM | POA: Insufficient documentation

## 2016-10-21 DIAGNOSIS — Z79899 Other long term (current) drug therapy: Secondary | ICD-10-CM | POA: Insufficient documentation

## 2016-10-21 DIAGNOSIS — K649 Unspecified hemorrhoids: Secondary | ICD-10-CM | POA: Insufficient documentation

## 2016-10-21 LAB — I-STAT CHEM 8, ED
BUN: 13 mg/dL (ref 6–20)
CALCIUM ION: 1.14 mmol/L — AB (ref 1.15–1.40)
CREATININE: 0.8 mg/dL (ref 0.61–1.24)
Chloride: 103 mmol/L (ref 101–111)
GLUCOSE: 101 mg/dL — AB (ref 65–99)
HCT: 29 % — ABNORMAL LOW (ref 39.0–52.0)
HEMOGLOBIN: 9.9 g/dL — AB (ref 13.0–17.0)
Potassium: 3.7 mmol/L (ref 3.5–5.1)
Sodium: 137 mmol/L (ref 135–145)
TCO2: 25 mmol/L (ref 0–100)

## 2016-10-21 LAB — POC OCCULT BLOOD, ED: FECAL OCCULT BLD: POSITIVE — AB

## 2016-10-21 NOTE — ED Triage Notes (Signed)
Pt reports 3 weeks of rectal bleeding and painful BM. Pt reports bright red blood and hx of internal hemorrhoids.

## 2016-10-21 NOTE — ED Provider Notes (Signed)
[facility]PT Provider Note   CSN: [id number] Arrival date & time: 10/21/16  1830     History   Chief Complaint Chief Complaint  Patient presents with  . Hemorrhoids    HPI [name] [name] is a 36 y.o. male.  Patient presents with intermittent rectal bleeding for the past 3 weeks. Patient's had this in the past was told hemorrhoid. Patient denies colonoscopy history. Suspension interpreter utilized. Initially mild amount of blood at times now all blood. No syncope or lightheadedness symptoms. No abdominal pain. No fevers or chills.      Past Medical History:  Diagnosis Date  . Hypertension     Patient Active Problem List   Diagnosis Date Noted  . Hemorrhoid 08/08/2016  . UTI (urinary tract infection) 07/15/2016  . Hyponatremia 07/15/2016  . Anemia 07/15/2016  . Sepsis (HCC) 07/15/2016  . 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 Hypertension 03/28/2016  . Hyperlipidemia 03/28/2016    Past Surgical History:  Procedure Laterality Date  . CYSTOSCOPY W/ RETROGRADES Left 07/08/2016   Procedure: CYSTOSCOPY WITH RETROGRADE PYELOGRAM AND STENT PLACEMENT;  Surgeon: Sebastian Ache, MD;  Location: [facility];  Service: Urology;  Laterality: Left;  . NO PAST SURGERIES    . ROBOT ASSISTED PYELOPLASTY Left 07/08/2016   Procedure: XI ROBOTIC ASSISTED PYELOPLASTY;  Surgeon: Sebastian Ache, MD;  Location: [facility];  Service: Urology;  Laterality: Left;       Home Medications    Prior to Admission medications   Medication Sig Start Date End Date Taking? Authorizing Provider  atorvastatin (LIPITOR) 10 MG tablet Take 1 tablet (10 mg total) by mouth daily. 06/03/16   Jaclyn Shaggy, MD  cyclobenzaprine (FLEXERIL) 10 MG tablet Take 1 tablet (10 mg total) by mouth 3 (three) times daily as needed for muscle spasms. Patient not taking: Reported on 07/15/2016 06/03/16   Jaclyn Shaggy, MD  lisinopril-hydrochlorothiazide (PRINZIDE,ZESTORETIC) 10-12.5 MG tablet Take 1 tablet by mouth  daily. 06/03/16   Jaclyn Shaggy, MD  oxyCODONE-acetaminophen (ROXICET) 5-325 MG tablet Take 1-2 tablets by mouth every 6 (six) hours as needed for severe pain. Patient not taking: Reported on 08/08/2016 07/23/16   Merlene Laughter, DO  pramoxine-hydrocortisone [facility]) 1-1 % rectal cream Place 1 application rectally 2 (two) times daily. 08/08/16   Jaclyn Shaggy, MD  sulfamethoxazole-trimethoprim (BACTRIM DS,SEPTRA DS) 800-160 MG tablet Take 1 tablet by mouth 2 (two) times daily. 08/08/16   Jaclyn Shaggy, MD    Family History Family History  Problem Relation Age of Onset  . Hypertension Father     Social History Social History  Substance Use Topics  . Smoking status: Never Smoker  . Smokeless tobacco: Never Used  . Alcohol use No     Comment: not since surgery     Allergies   Doxycycline   Review of Systems Review of Systems  Constitutional: Negative for chills and fever.  HENT: Negative for congestion.   Eyes: Negative for visual disturbance.  Respiratory: Negative for shortness of breath.   Cardiovascular: Negative for chest pain.  Gastrointestinal: Positive for blood in stool. Negative for abdominal pain and vomiting.  Genitourinary: Negative for dysuria and flank pain.  Musculoskeletal: Negative for back pain, neck pain and neck stiffness.  Skin: Negative for rash.  Neurological: Negative for light-headedness and headaches.     Physical Exam Updated Vital Signs BP 117/67 (BP Location: Left Arm)   Pulse 74   Temp 98.8 F (37.1 C) (Oral)   Resp 16   SpO2 99%  Physical Exam  Constitutional: He is oriented to person, place, and time. He appears well-developed and well-nourished.  HENT:  Head: Normocephalic and atraumatic.  Eyes: Conjunctivae are normal. Right eye exhibits no discharge. Left eye exhibits no discharge.  Neck: Normal range of motion. Neck supple. No tracheal deviation present.  Cardiovascular: Normal rate.   Pulmonary/Chest: Effort normal.    Abdominal: Soft. He exhibits no distension. There is no tenderness. There is no guarding.  Genitourinary:  Genitourinary Comments: Patient has normal rectal tone, no significant hemorrhoids appreciated. Brown stool.  Musculoskeletal: He exhibits no edema.  Neurological: He is alert and oriented to person, place, and time.  Skin: Skin is warm. No rash noted.  Psychiatric: He has a normal mood and affect.  Nursing note and vitals reviewed.    ED Treatments / Results  Labs (all labs ordered are listed, but only abnormal results are displayed) Labs Reviewed  I-STAT CHEM 8, ED - Abnormal; Notable for the following:       Result Value   Glucose, Bld 101 (*)    Calcium, Ion 1.14 (*)    Hemoglobin 9.9 (*)    HCT 29.0 (*)    All other components within normal limits  POC OCCULT BLOOD, ED - Abnormal; Notable for the following:    Fecal Occult Bld POSITIVE (*)    All other components within normal limits  OCCULT BLOOD X 1 CARD TO LAB, STOOL    EKG  EKG Interpretation None       Radiology No results found.  Procedures Procedures (including critical care time)  Medications Ordered in ED Medications - No data to display   Initial Impression / Assessment and Plan / ED Course  I have reviewed the triage vital signs and the nursing notes.  Pertinent labs & imaging results that were available during my care of the patient were reviewed by me and considered in my medical decision making (see chart for details).    Patient presents with intermittent bleeding likely from internal hemorrhoids. Discussed close follow-up with gastroenterology. Hemoglobin showed chronic anemia.  Results and differential diagnosis were discussed with the patient/parent/guardian. Xrays were independently reviewed by myself.  Close follow up outpatient was discussed, comfortable with the plan.   Medications - No data to display  Vitals:   10/21/16 1903 10/21/16 2114  BP: 108/63 117/67  Pulse: 85  74  Resp: 18 16  Temp: 98.4 F (36.9 C) 98.8 F (37.1 C)  TempSrc: Oral Oral  SpO2: 100% 99%    Final diagnoses:  Bleeding hemorrhoids     Final Clinical Impressions(s) / ED Diagnoses   Final diagnoses:  Bleeding hemorrhoids    New Prescriptions New Prescriptions   No medications on file     Blane Ohara, MD 10/21/16 2149

## 2016-10-21 NOTE — Discharge Instructions (Signed)
If you were given medicines take as directed.  If you are on coumadin or contraceptives realize their levels and effectiveness is altered by many different medicines.  If you have any reaction (rash, tongues swelling, other) to the medicines stop taking and see a physician.    If your blood pressure was elevated in the ER make sure you follow up for management with a primary doctor or return for chest pain, shortness of breath or stroke symptoms.  Please follow up as directed and return to the ER or see a physician for new or worsening symptoms.  Thank you. Vitals:   10/21/16 1903 10/21/16 2114  BP: 108/63 117/67  Pulse: 85 74  Resp: 18 16  Temp: 98.4 F (36.9 C) 98.8 F (37.1 C)  TempSrc: Oral Oral  SpO2: 100% 99%

## 2016-10-24 ENCOUNTER — Encounter: Payer: Self-pay | Admitting: Gastroenterology

## 2016-10-25 ENCOUNTER — Encounter: Payer: Self-pay | Admitting: Gastroenterology

## 2016-10-26 ENCOUNTER — Ambulatory Visit: Payer: Self-pay | Attending: Family Medicine

## 2016-11-01 ENCOUNTER — Encounter: Payer: Self-pay | Admitting: Gastroenterology

## 2016-11-01 ENCOUNTER — Ambulatory Visit (INDEPENDENT_AMBULATORY_CARE_PROVIDER_SITE_OTHER): Payer: Self-pay | Admitting: Gastroenterology

## 2016-11-01 VITALS — BP 120/70 | HR 84 | Ht 67.5 in | Wt 227.0 lb

## 2016-11-01 DIAGNOSIS — K602 Anal fissure, unspecified: Secondary | ICD-10-CM | POA: Insufficient documentation

## 2016-11-01 DIAGNOSIS — K6289 Other specified diseases of anus and rectum: Secondary | ICD-10-CM | POA: Insufficient documentation

## 2016-11-01 DIAGNOSIS — K625 Hemorrhage of anus and rectum: Secondary | ICD-10-CM

## 2016-11-01 MED ORDER — AMBULATORY NON FORMULARY MEDICATION: 0.1250 mg | Freq: Two times a day (BID) | 0 refills | Status: DC

## 2016-11-01 NOTE — Patient Instructions (Signed)
We have sent a prescription for nitroglycerin 0.125% gel to [facility]. You should apply a pea size amount to your rectum with a gloved finger up to the first knuckle, three times daily x 6-8 weeks.  [facility]'s information is below: Address: [address] [place], [place], [place] [address]  [phone]

## 2016-11-01 NOTE — Progress Notes (Signed)
11/01/2016 [name] [name] [id number] 03-Dec-1980   HISTORY OF PRESENT ILLNESS:  This is a 36 year old Hispanic male who was referred to our office by his PCP, Dr. Jaclyn Shaggy, for evaluation of rectal pain and bleeding.  The patient is Spanish-speaking so there was a professional interpreter present for the visit. He tells me that he has been having pain in his rectum with bowel movements, wiping, etc. He has had some rectal bleeding as well. He says that this has been present for more than a month. He was told that he had internal hemorrhoids, but then says that he was never examined. He was treated with hydrocortisone cream but says that it has not been helping. He denies any issues with moving his bowels or abdominal pain.   Past Medical History:  Diagnosis Date  . Hypertension    Past Surgical History:  Procedure Laterality Date  . CYSTOSCOPY W/ RETROGRADES Left 07/08/2016   Procedure: CYSTOSCOPY WITH RETROGRADE PYELOGRAM AND STENT PLACEMENT;  Surgeon: Sebastian Ache, MD;  Location: [facility];  Service: Urology;  Laterality: Left;  . NO PAST SURGERIES    . ROBOT ASSISTED PYELOPLASTY Left 07/08/2016   Procedure: XI ROBOTIC ASSISTED PYELOPLASTY;  Surgeon: Sebastian Ache, MD;  Location: [facility];  Service: Urology;  Laterality: Left;    reports that he has never smoked. He has never used smokeless tobacco. He reports that he does not drink alcohol or use drugs. family history includes Hypertension in his father. Allergies  Allergen Reactions  . Doxycycline Rash      Outpatient Encounter Prescriptions as of 11/01/2016  Medication Sig  . atorvastatin (LIPITOR) 10 MG tablet Take 1 tablet (10 mg total) by mouth daily.  Marland Kitchen lisinopril-hydrochlorothiazide (PRINZIDE,ZESTORETIC) 10-12.5 MG tablet Take 1 tablet by mouth daily.  . [DISCONTINUED] cyclobenzaprine (FLEXERIL) 10 MG tablet Take 1 tablet (10 mg total) by mouth 3 (three) times daily as needed for muscle spasms. (Patient not taking: Reported  on 07/15/2016)  . [DISCONTINUED] oxyCODONE-acetaminophen (ROXICET) 5-325 MG tablet Take 1-2 tablets by mouth every 6 (six) hours as needed for severe pain. (Patient not taking: Reported on 08/08/2016)  . [DISCONTINUED] pramoxine-hydrocortisone (ANALPRAM-HC) 1-1 % rectal cream Place 1 application rectally 2 (two) times daily.  . [DISCONTINUED] sulfamethoxazole-trimethoprim (BACTRIM DS,SEPTRA DS) 800-160 MG tablet Take 1 tablet by mouth 2 (two) times daily.   No facility-administered encounter medications on file as of 11/01/2016.      REVIEW OF SYSTEMS  : All other systems reviewed and negative except where noted in the History of Present Illness.   PHYSICAL EXAM: BP 120/70   Pulse 84   Ht 5' 7.5" (1.715 m)   Wt 227 lb (103 kg)   BMI 35.03 kg/m  General: Well developed Hispanic male in no acute distress Head: Normocephalic and atraumatic Eyes:  Sclerae anicteric, conjunctiva pink. Ears: Normal auditory acuity Lungs: Clear throughout to auscultation; no increased WOB. Heart: Regular rate and rhythm Abdomen: Soft, non-distended.  Normal bowel sounds.  Non-tender. Rectal:  A small skin tag was noted on the right posterior.  No external hemorrhoids seen.  Bright red blood was produced with minimal perianal examination.  DRE extremely painful, was able to localize to posterior region.  No masses felt.  No definite fissure seen or felt.  Anoscopy not attempted. Musculoskeletal: Symmetrical with no gross deformities  Skin: No lesions on visible extremities Extremities: No edema  Neurological: Alert oriented x 4, grossly non-focal Psychological:  Alert and cooperative. Normal mood and affect  ASSESSMENT AND PLAN: -Rectal pain/bleeding:  Extreme pain and bleeding on exam today.  I suspect that this is an anal fissure.  I am going to treat him with nitro gel 0.125% TID for at least 8 weeks.  I discussed avoiding aggressive hygiene measures, keeping stools soft.  I am going to have him come back in  3-4 weeks so that I can re-examine him to be sure he is having improvement, etc.  CC:  Jaclyn Shaggy, MD

## 2016-11-07 ENCOUNTER — Ambulatory Visit: Payer: Self-pay | Admitting: Family Medicine

## 2016-11-09 NOTE — Progress Notes (Signed)
Reviewed and agree with documentation and assessment and plan. K. Veena Meryle Pugmire , MD   

## 2016-11-22 ENCOUNTER — Encounter (INDEPENDENT_AMBULATORY_CARE_PROVIDER_SITE_OTHER): Payer: Self-pay

## 2016-11-22 ENCOUNTER — Ambulatory Visit (INDEPENDENT_AMBULATORY_CARE_PROVIDER_SITE_OTHER): Payer: Self-pay | Admitting: Gastroenterology

## 2016-11-22 ENCOUNTER — Encounter: Payer: Self-pay | Admitting: Gastroenterology

## 2016-11-22 VITALS — BP 136/60 | HR 84 | Ht 68.25 in | Wt 229.0 lb

## 2016-11-22 DIAGNOSIS — K602 Anal fissure, unspecified: Secondary | ICD-10-CM

## 2016-11-22 DIAGNOSIS — K6289 Other specified diseases of anus and rectum: Secondary | ICD-10-CM

## 2016-11-22 DIAGNOSIS — K625 Hemorrhage of anus and rectum: Secondary | ICD-10-CM

## 2016-11-22 MED ORDER — AMBULATORY NON FORMULARY MEDICATION: 0.1250 mg | Freq: Two times a day (BID) | 3 refills | Status: DC

## 2016-11-22 NOTE — Progress Notes (Signed)
     11/22/2016 [name] [name] [id number] 08/17/1980   HISTORY OF PRESENT ILLNESS:  This is a 36 year old male here for follow-up of rectal bleeding and pain.  Treated for posterior anal fissure.  Was given nitro gel 0.125%, which he has been using twice a day for the past 3 weeks.  Has noticed about 70% improvement.  Still has some pain at time and sees some blood on and off.  Video interpreter used for visit today.   Past Medical History:  Diagnosis Date  . Hypertension    Past Surgical History:  Procedure Laterality Date  . CYSTOSCOPY W/ RETROGRADES Left 07/08/2016   Procedure: CYSTOSCOPY WITH RETROGRADE PYELOGRAM AND STENT PLACEMENT;  Surgeon: [name] Acheheodore Manny, MD;  Location: [facility];  Service: Urology;  Laterality: Left;  . NO PAST SURGERIES    . ROBOT ASSISTED PYELOPLASTY Left 07/08/2016   Procedure: XI ROBOTIC ASSISTED PYELOPLASTY;  Surgeon: [name] Acheheodore Manny, MD;  Location: [facility];  Service: Urology;  Laterality: Left;    reports that he has never smoked. He has never used smokeless tobacco. He reports that he does not drink alcohol or use drugs. family history includes Hypertension in his father. Allergies  Allergen Reactions  . Doxycycline Rash      Outpatient Encounter Prescriptions as of 11/22/2016  Medication Sig  . AMBULATORY NON FORMULARY MEDICATION Place 0.125 mg rectally 2 (two) times daily. Medication Name: Nitroglycerin ointment  . atorvastatin (LIPITOR) 10 MG tablet Take 1 tablet (10 mg total) by mouth daily.  Marland Kitchen. lisinopril-hydrochlorothiazide (PRINZIDE,ZESTORETIC) 10-12.5 MG tablet Take 1 tablet by mouth daily.   No facility-administered encounter medications on file as of 11/22/2016.      REVIEW OF SYSTEMS  : All other systems reviewed and negative except where noted in the History of Present Illness.   PHYSICAL EXAM: BP 136/60   Pulse 84   Ht 5' 8.25" (1.734 m)   Wt 229 lb (103.9 kg)   BMI 34.56 kg/m  General: Well developed Hispanic male in no acute  distress Head: Normocephalic and atraumatic Eyes:  Sclerae anicteric, conjunctiva pink. Ears: Normal auditory acuity. Lungs: Clear throughout to auscultation Heart: Regular rate and rhythm Abdomen: Soft, non-distended.  BS present.  Non-tender. Rectal:  Small skin tag noted exteriorly.  DRE revealed a deep anal fissure posteriorly.  Also felt somewhat nodular on the right.  Anoscopy revealed internal hemorrhoids and blood coming from the posterior position. Musculoskeletal: Symmetrical with no gross deformities  Skin: No lesions on visible extremities Extremities: No edema  Neurological: Alert oriented x 4, grossly nonfocal Cervical Nodes:  No significant cervical adenopathy Psychological:  Alert and cooperative. Normal mood and affect  ASSESSMENT AND PLAN: -Posterior anal fissure with bleeding and pain:  About 70% improved with nitro gel 0.125% BID for the past 3 weeks.  Also will have him start Metamucil 2 Tbsp BID.  Will continue this for another 5-7 weeks.  Will bring back for follow-up again in about 6 weeks.  *Patient examined by Dr. Marina [name] who confirmed deep posterior anal fissure.   CC:  [name] ShaggyAmao, Enobong, MD

## 2016-11-22 NOTE — Patient Instructions (Signed)
We [facility] refills [facility] the Nitroglycerin [facility].125 % [facility] [address].   Take 2 tablespoons of Metamucil mixed with water or Juice daily.   We made you a follow up appointment with Dr. Lavon PaganiniNandigam for 01-03-2017  At 2:00 PM.

## 2016-11-30 ENCOUNTER — Ambulatory Visit: Payer: Self-pay | Admitting: Gastroenterology

## 2016-12-01 NOTE — Progress Notes (Signed)
Reviewed and agree with documentation and assessment and plan. K. Veena Swayze Pries , MD   

## 2017-01-02 ENCOUNTER — Ambulatory Visit: Payer: Self-pay | Attending: Family Medicine | Admitting: Family Medicine

## 2017-01-02 ENCOUNTER — Ambulatory Visit: Payer: Self-pay | Admitting: Family Medicine

## 2017-01-02 ENCOUNTER — Encounter: Payer: Self-pay | Admitting: Family Medicine

## 2017-01-02 VITALS — BP 128/73 | HR 109 | Temp 98.7°F | Resp 18 | Ht 69.0 in | Wt 232.4 lb

## 2017-01-02 DIAGNOSIS — B356 Tinea cruris: Secondary | ICD-10-CM

## 2017-01-02 DIAGNOSIS — I10 Essential (primary) hypertension: Secondary | ICD-10-CM | POA: Insufficient documentation

## 2017-01-02 DIAGNOSIS — E7800 Pure hypercholesterolemia, unspecified: Secondary | ICD-10-CM

## 2017-01-02 MED ORDER — CLOTRIMAZOLE 1 % EX CREA: 1.0000 "application " | TOPICAL_CREAM | Freq: Two times a day (BID) | CUTANEOUS | 1 refills | Status: DC

## 2017-01-02 MED ORDER — ATORVASTATIN CALCIUM 10 MG PO TABS
10.0000 mg | ORAL_TABLET | Freq: Every day | ORAL | 6 refills | Status: DC
Start: 2017-01-02 — End: 2017-06-23

## 2017-01-02 MED ORDER — LISINOPRIL-HYDROCHLOROTHIAZIDE 10-12.5 MG PO TABS: 1.0000 | ORAL_TABLET | Freq: Every day | ORAL | 6 refills | Status: DC

## 2017-01-02 NOTE — Progress Notes (Signed)
Subjective:  Patient ID: [name] [name], male    DOB: 1980/10/26  Age: 36 y.o. 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  He has been compliant with his antihypertensive and his statin and denies any side effects from his medications.  He does complain of itching around his scrotum which is worse with the heat and would like to have a cream for this. He has no other concerns today.   Past Medical History:  Diagnosis Date  . Hypertension     Past Surgical History:  Procedure Laterality Date  . CYSTOSCOPY W/ RETROGRADES Left 07/08/2016   Procedure: CYSTOSCOPY WITH RETROGRADE PYELOGRAM AND STENT PLACEMENT;  Surgeon: Sebastian Acheheodore Manny, MD;  Location: [facility];  Service: Urology;  Laterality: Left;  . NO PAST SURGERIES    . ROBOT ASSISTED PYELOPLASTY Left 07/08/2016   Procedure: XI ROBOTIC ASSISTED PYELOPLASTY;  Surgeon: Sebastian Acheheodore Manny, MD;  Location: [facility];  Service: Urology;  Laterality: Left;    Allergies  Allergen Reactions  . Doxycycline Rash     Outpatient Medications Prior to Visit  Medication Sig Dispense Refill  . AMBULATORY NON FORMULARY MEDICATION Place 0.125 mg rectally 2 (two) times daily. Medication Name: Nitroglycerin ointment 30 g 3  . atorvastatin (LIPITOR) 10 MG tablet Take 1 tablet (10 mg total) by mouth daily. 30 tablet 5  . lisinopril-hydrochlorothiazide (PRINZIDE,ZESTORETIC) 10-12.5 MG tablet Take 1 tablet by mouth daily. 30 tablet 5   No facility-administered medications prior to visit.     ROS Review of Systems  Constitutional: Negative for activity change and appetite change.  HENT: Negative for sinus pressure and sore throat.   Eyes: Negative for visual disturbance.  Respiratory:  Negative for cough, chest tightness and shortness of breath.   Cardiovascular: Negative for chest pain and leg swelling.  Gastrointestinal: Negative for abdominal distention, abdominal pain, constipation and diarrhea.  Endocrine: Negative.   Genitourinary: Negative for dysuria.  Musculoskeletal: Negative for joint swelling and myalgias.  Skin: Negative for rash.  Allergic/Immunologic: Negative.   Neurological: Negative for weakness, light-headedness and numbness.  Psychiatric/Behavioral: Negative for dysphoric mood and suicidal ideas.    Objective:  BP 128/73 (BP Location: Left Arm, Patient Position: Sitting, Cuff Size: Large)   Pulse (!) 109   Temp 98.7 F (37.1 C) (Oral)   Resp 18   Ht 5\' 9"  (1.753 m)   Wt 232 lb 6.4 oz (105.4 kg)   SpO2 99%   BMI 34.32 kg/m   BP/Weight 01/02/2017 11/22/2016 11/01/2016  Systolic BP 128 136 120  Diastolic BP 73 60 70  Wt. (Lbs) 232.4 229 227  BMI 34.32 34.56 35.03      Physical Exam  Constitutional: He is oriented to person, place, and time. He appears well-developed and well-nourished.  Cardiovascular: Normal heart sounds and intact distal pulses.  Tachycardia present.   No murmur heard. Pulmonary/Chest: Effort normal and breath sounds normal. He has no wheezes. He has no rales. He exhibits no tenderness.  Abdominal: Soft. Bowel sounds are normal. He exhibits no distension and no mass. There is no tenderness.  Musculoskeletal: Normal range of motion.  Neurological: He is alert and oriented to person, place, and time.  Skin: Skin is warm and dry.  Psychiatric: He has a normal  mood and affect.     Assessment & Plan:   1. Essential hypertension Controlled Low-sodium diet - lisinopril-hydrochlorothiazide (PRINZIDE,ZESTORETIC) 10-12.5 MG tablet; Take 1 tablet by mouth daily.  Dispense: 30 tablet; Refill: 6 - Basic Metabolic Panel  2. Pure hypercholesterolemia Controlled Low-cholesterol diet - atorvastatin (LIPITOR) 10 MG tablet; Take  1 tablet (10 mg total) by mouth daily.  Dispense: 30 tablet; Refill: 6  3. Tinea cruris - clotrimazole (LOTRIMIN) 1 % cream; Apply 1 application topically 2 (two) times daily.  Dispense: 60 g; Refill: 1   Meds ordered this encounter  Medications  . lisinopril-hydrochlorothiazide (PRINZIDE,ZESTORETIC) 10-12.5 MG tablet    Sig: Take 1 tablet by mouth daily.    Dispense:  30 tablet    Refill:  6  . atorvastatin (LIPITOR) 10 MG tablet    Sig: Take 1 tablet (10 mg total) by mouth daily.    Dispense:  30 tablet    Refill:  6  . clotrimazole (LOTRIMIN) 1 % cream    Sig: Apply 1 application topically 2 (two) times daily.    Dispense:  60 g    Refill:  1    Follow-up: Return in about 6 months (around 07/05/2017) for follow up of chronic medical conditions.    This note has been created with Education officer, environmental. Any transcriptional errors are unintentional.     Jaclyn Shaggy MD

## 2017-01-03 ENCOUNTER — Encounter: Payer: Self-pay | Admitting: Gastroenterology

## 2017-01-03 ENCOUNTER — Ambulatory Visit (INDEPENDENT_AMBULATORY_CARE_PROVIDER_SITE_OTHER): Payer: Self-pay | Admitting: Gastroenterology

## 2017-01-03 VITALS — BP 122/70 | HR 92 | Ht 68.5 in | Wt 233.0 lb

## 2017-01-03 DIAGNOSIS — K5900 Constipation, unspecified: Secondary | ICD-10-CM

## 2017-01-03 DIAGNOSIS — K601 Chronic anal fissure: Secondary | ICD-10-CM

## 2017-01-03 DIAGNOSIS — K6289 Other specified diseases of anus and rectum: Secondary | ICD-10-CM

## 2017-01-03 DIAGNOSIS — K625 Hemorrhage of anus and rectum: Secondary | ICD-10-CM

## 2017-01-03 LAB — BASIC METABOLIC PANEL
BUN/Creatinine Ratio: 15 (ref 9–20)
BUN: 13 mg/dL (ref 6–20)
CO2: 22 mmol/L (ref 20–29)
CREATININE: 0.86 mg/dL (ref 0.76–1.27)
Calcium: 9.3 mg/dL (ref 8.7–10.2)
Chloride: 98 mmol/L (ref 96–106)
GFR calc Af Amer: 131 mL/min/{1.73_m2} (ref >59)
GFR, EST NON AFRICAN AMERICAN: 113 mL/min/{1.73_m2} (ref >59)
GLUCOSE: 90 mg/dL (ref 65–99)
Potassium: 4.4 mmol/L (ref 3.5–5.2)
Sodium: 139 mmol/L (ref 134–144)

## 2017-01-03 MED ORDER — AMBULATORY NON FORMULARY MEDICATION: 1 refills | Status: DC

## 2017-01-03 MED ORDER — SUPREP BOWEL PREP KIT 17.5-3.13-1.6 GM/177ML PO SOLN
ORAL | 0 refills | Status: DC
Start: 2017-01-03 — End: 2017-02-15

## 2017-01-03 NOTE — Progress Notes (Signed)
[name] [name]    [id number]    26-Jul-1980  Primary Care Physician:Amao, Odette HornsEnobong, MD  Referring Physician: Jaclyn ShaggyAmao, Enobong, MD [address] [place], [place] [id number]  Chief complaint:  Anal pain/discomfort  HPI:  36 yr M here for follow up visit accompanied by interpreter. He was last seen by Doug SouJessica Zehr 11/01/16 and 11/22/16 and diagnosed to have anal fissure Patient complaints of persitent anal discomfort and small volume intermittent rectal bleeding. He is applying rectal nitroglycerine 2-3 times a day. At times he feels he has to forcefully insert his finger to apply the rectal cream. Denies anal trauma or intercourse.  Denies hard stool or excessive straining. He has intermittent constipation with sensation of incomplete evacuation    Outpatient Encounter Prescriptions as of 01/03/2017  Medication Sig  . AMBULATORY NON FORMULARY MEDICATION Place 0.125 mg rectally 2 (two) times daily. Medication Name: Nitroglycerin ointment  . atorvastatin (LIPITOR) 10 MG tablet Take 1 tablet (10 mg total) by mouth daily.  . clotrimazole (LOTRIMIN) 1 % cream Apply 1 application topically 2 (two) times daily.  Marland Kitchen. lisinopril-hydrochlorothiazide (PRINZIDE,ZESTORETIC) 10-12.5 MG tablet Take 1 tablet by mouth daily.  . Omega-3 Fatty Acids (FISH OIL) 500 MG CAPS Take 1 each by mouth daily.   No facility-administered encounter medications on file as of 01/03/2017.     Allergies as of 01/03/2017 - Review Complete 01/03/2017  Allergen Reaction Noted  . Doxycycline Rash 02/09/2016    Past Medical History:  Diagnosis Date  . Hypertension     Past Surgical History:  Procedure Laterality Date  . CYSTOSCOPY W/ RETROGRADES Left 07/08/2016   Procedure: CYSTOSCOPY WITH RETROGRADE PYELOGRAM AND STENT PLACEMENT;  Surgeon: Sebastian Acheheodore Manny, MD;  Location: [facility];  Service: Urology;  Laterality: Left;  . NO PAST SURGERIES    . ROBOT ASSISTED PYELOPLASTY Left 07/08/2016   Procedure: XI ROBOTIC  ASSISTED PYELOPLASTY;  Surgeon: Sebastian Acheheodore Manny, MD;  Location: [facility];  Service: Urology;  Laterality: Left;    Family History  Problem Relation Age of Onset  . Hypertension Father   . Leukemia Daughter        age 948 yrs old   . Colon cancer Neg Hx   . Stomach cancer Neg Hx     Social History   Social History  . Marital status: Legally Separated    Spouse name: N/A  . Number of children: N/A  . Years of education: N/A   Occupational History  . Not on file.   Social History Main Topics  . Smoking status: Never Smoker  . Smokeless tobacco: Never Used  . Alcohol use 7.2 - 8.4 oz/week    12 - 14 Cans of beer per week     Comment: occ   . Drug use: No  . Sexual activity: Yes    Partners: Female   Other Topics Concern  . Not on file   Social History Narrative  . No narrative on file      Review of systems: Review of Systems  Constitutional: Negative for fever and chills.  HENT: Negative.   Eyes: Negative for blurred vision.  Respiratory: Negative for cough, shortness of breath and wheezing.   Cardiovascular: Negative for chest pain and palpitations.  Gastrointestinal: as per HPI Genitourinary: Negative for dysuria, urgency, frequency and hematuria.  Musculoskeletal: Negative for myalgias, back pain and joint pain.  Skin: Negative for itching and rash.  Neurological: Negative for dizziness, tremors, focal weakness, seizures  and loss of consciousness.  Endo/Heme/Allergies: Positive for seasonal allergies.  Psychiatric/Behavioral: Negative for depression, suicidal ideas and hallucinations.  All other systems reviewed and are negative.   Physical Exam: Vitals:   01/03/17 1406  BP: 122/70  Pulse: 92   Body mass index is 34.91 kg/m. Gen:      No acute distress HEENT:  EOMI, sclera anicteric Neck:     No masses; no thyromegaly Lungs:    Clear to auscultation bilaterally; normal respiratory effort CV:         Regular rate and rhythm; no murmurs Abd:      +  bowel sounds; soft, non-tender; no palpable masses, no distension Ext:    No edema; adequate peripheral perfusion Skin:      Warm and dry; no rash Neuro: alert and oriented x 3 Psych: normal mood and affect Rectal exam: Normal anal sphincter tone,+anal fissure or external hemorrhoids Anoscopy: Small internal hemorrhoids, multiple anal fissure X5 with some bleeding, normal dentate line, no visible nodules  Data Reviewed:  Reviewed labs, radiology imaging, old records and pertinent past GI work up   Assessment and Plan/Recommendations:  36 yr M with persistent anorectal pain and non healing anal fissures  Advised patient to use Qtip to apply pea size amount of 0.125% Nitroglycerine three times daily and avoid inserting his finger into the rectum Increase dietary fluid and fiber intake Miralax 1/2 capful daily to have 1-2 soft BM per day  Will schedule for colonoscopy to exclude IBD The risks and benefits as well as alternatives of endoscopic procedure(s) have been discussed and reviewed. All questions answered. The patient agrees to proceed.   Iona Beard , MD [phone] Mon-Fri 8a-5p [phone] after 5p, weekends, holidays  CC: Jaclyn Shaggy, MD

## 2017-01-03 NOTE — Patient Instructions (Addendum)
You have been scheduled for a colonoscopy. Please follow written instructions given to you at your visit today.  Please pick up your prep supplies at the pharmacy within the next 1-3 days. If you use inhalers (even only as needed), please bring them with you on the day of your procedure. Your physician has requested that you go to www.startemmi.com and enter the access code given to you at your visit today. This web site gives a general overview about your procedure. However, you should still follow specific instructions given to you by our office regarding your preparation for the procedure.  Take Miralax, 1/2 capful daily.  We have given you a printed prescription for Nitroglycerine ointment for [facility] to [facility].   We gave you a sample SUPREP today.

## 2017-02-10 ENCOUNTER — Encounter: Payer: Self-pay | Admitting: Gastroenterology

## 2017-02-15 ENCOUNTER — Ambulatory Visit (AMBULATORY_SURGERY_CENTER): Payer: Self-pay | Admitting: Gastroenterology

## 2017-02-15 ENCOUNTER — Encounter: Payer: Self-pay | Admitting: Gastroenterology

## 2017-02-15 VITALS — BP 122/65 | HR 59 | Temp 98.6°F | Resp 25 | Ht 68.5 in | Wt 233.0 lb

## 2017-02-15 DIAGNOSIS — K601 Chronic anal fissure: Secondary | ICD-10-CM

## 2017-02-15 MED ORDER — SODIUM CHLORIDE 0.9 % IV SOLN: 500.0000 mL | INTRAVENOUS | Status: DC

## 2017-02-15 NOTE — Progress Notes (Signed)
Called to room to assist during endoscopic procedure.  Patient ID and intended procedure confirmed with present staff. Received instructions for my participation in the procedure from the performing physician.  

## 2017-02-15 NOTE — Patient Instructions (Signed)
YOU HAD AN ENDOSCOPIC PROCEDURE TODAY AT THE [facility]:   Refer to the procedure report that was given to you for any specific questions about what was found during the examination.  If the procedure report does not answer your questions, please call your gastroenterologist to clarify.  If you requested that your care partner not be given the details of your procedure findings, then the procedure report has been included in a sealed envelope for you to review at your convenience later.  YOU SHOULD EXPECT: Some feelings of bloating in the abdomen. Passage of more gas than usual.  Walking can help get rid of the air that was put into your GI tract during the procedure and reduce the bloating. If you had a lower endoscopy (such as a colonoscopy or flexible sigmoidoscopy) you may notice spotting of blood in your stool or on the toilet paper. If you underwent a bowel prep for your procedure, you may not have a normal bowel movement for a few days.  Please Note:  You might notice some irritation and congestion in your nose or some drainage.  This is from the oxygen used during your procedure.  There is no need for concern and it should clear up in a day or so.  SYMPTOMS TO REPORT IMMEDIATELY:   Following lower endoscopy (colonoscopy or flexible sigmoidoscopy):  Excessive amounts of blood in the stool  Significant tenderness or worsening of abdominal pains  Swelling of the abdomen that is new, acute  Fever of 100F or higher  For urgent or emergent issues, a gastroenterologist can be reached at any hour by calling [phone].   DIET:  We do recommend a small meal at first, but then you may proceed to your regular diet.  Drink plenty of fluids but you should avoid alcoholic beverages for 24 hours.  ACTIVITY:  You should plan to take it easy for the rest of today and you should NOT DRIVE or use heavy machinery until tomorrow (because of the sedation medicines used during the test).     FOLLOW UP: Our staff will call the number listed on your records the next business day following your procedure to check on you and address any questions or concerns that you may have regarding the information given to you following your procedure. If we do not reach you, we will leave a message.  However, if you are feeling well and you are not experiencing any problems, there is no need to return our call.  We will assume that you have returned to your regular daily activities without incident.  If any biopsies were taken you will be contacted by phone or by letter within the next 1-3 weeks.  Please call us at [phone] if you have not heard about the biopsies in 3 weeks.   Await for biopsy results Repeat Colonoscopy at age 36 for screening purposes Benefiber 1 tablespoon TID with meals Continue rectal Nitroglycerine 0.125% small amount per rectum three times a day for two months Hemorrhoids (handout given)   SIGNATURES/CONFIDENTIALITY: You and/or your care partner have signed paperwork which will be entered into your electronic medical record.  These signatures attest to the fact that that the information above on your After Visit Summary has been reviewed and is understood.  Full responsibility of the confidentiality of this discharge information lies with you and/or your care-partner.

## 2017-02-15 NOTE — Progress Notes (Signed)
A and O x3. Report to RN. Tolerated MAC anesthesia well.

## 2017-02-15 NOTE — Op Note (Signed)
[facility] Patient Name: [name] [name] Procedure Date: 02/15/2017 9:40 AM MRN: [id number] Endoscopist: Napoleon Form , MD Age: 36 Referring MD:  Date of Birth: [birth year] Gender: Male Account #: [id number] Procedure:                Colonoscopy Indications:              Evaluation of unexplained GI bleeding, chronic non                            healing anal fissure, to exclude IBD Medicines:                Monitored Anesthesia Care Procedure:                Pre-Anesthesia Assessment:                           - Prior to the procedure, a History and Physical                            was performed, and patient medications and                            allergies were reviewed. The patient's tolerance of                            previous anesthesia was also reviewed. The risks                            and benefits of the procedure and the sedation                            options and risks were discussed with the patient.                            All questions were answered, and informed consent                            was obtained. Prior Anticoagulants: The patient has                            taken no previous anticoagulant or antiplatelet                            agents. ASA Grade Assessment: II - A patient with                            mild systemic disease. After reviewing the risks                            and benefits, the patient was deemed in                            satisfactory condition to undergo the procedure.  After obtaining informed consent, the colonoscope                            was passed under direct vision. Throughout the                            procedure, the patient's blood pressure, pulse, and                            oxygen saturations were monitored continuously. The                            Colonoscope was introduced through the anus and                            advanced to the the  terminal ileum, with                            identification of the appendiceal orifice and IC                            valve. The colonoscopy was performed without                            difficulty. The patient tolerated the procedure                            well. The quality of the bowel preparation was                            excellent. The terminal ileum, ileocecal valve,                            appendiceal orifice, and rectum were photographed. Scope In: 9:48:46 AM Scope Out: 10:13:21 AM Scope Withdrawal Time: 0 hours 11 minutes 46 seconds  Total Procedure Duration: 0 hours 24 minutes 35 seconds  Findings:                 The perianal exam findings include anal fissure.                           The terminal ileum appeared normal. Biopsies were                            taken with a cold forceps for histology.                           Internal hemorrhoids were found during                            retroflexion. The hemorrhoids were medium-sized.                           The exam was otherwise without abnormality.  Biopsies were taken with a cold forceps in the                            entire colon for histology. Complications:            No immediate complications. Estimated Blood Loss:     Estimated blood loss was minimal. Impression:               - Anal fissure found on perianal exam.                           - The examined portion of the ileum was normal.                            Biopsied.                           - Internal hemorrhoids.                           - The examination was otherwise normal.                           - Biopsies were taken with a cold forceps for                            histology in the entire colon. Recommendation:           - Patient has a contact number available for                            emergencies. The signs and symptoms of potential                            delayed complications  were discussed with the                            patient. Return to normal activities tomorrow.                            Written discharge instructions were provided to the                            patient.                           - Resume previous diet.                           - Continue present medications.                           - Await pathology results.                           -Benefiber 1 tablespoon TID with meals                           -  Continue rectal Nitroglycerine 0.125% small                            amount per rectum TID X 2 months, if no improvement                            refer to colorectal surgery for chronic non healing                            anal fissure                           - Repeat colonoscopy at age 2 for screening                            purposes. Napoleon Form, MD 02/15/2017 10:19:50 AM This report has been signed electronically.

## 2017-02-16 ENCOUNTER — Telehealth: Payer: Self-pay

## 2017-02-16 ENCOUNTER — Telehealth: Payer: Self-pay | Admitting: *Deleted

## 2017-02-16 NOTE — Telephone Encounter (Signed)
Called [phone] and left a messaged we tried to reach pt for a follow up call. maw

## 2017-02-16 NOTE — Telephone Encounter (Signed)
No answer on wife's phone that was called as requested. Number verified. Left message to call if questions or concerns and that we would make an attempt to call again later in the day.

## 2017-02-25 ENCOUNTER — Encounter: Payer: Self-pay | Admitting: Gastroenterology

## 2017-03-20 ENCOUNTER — Ambulatory Visit: Payer: Self-pay | Admitting: Gastroenterology

## 2017-05-01 ENCOUNTER — Ambulatory Visit: Payer: Self-pay | Attending: Nurse Practitioner | Admitting: Nurse Practitioner

## 2017-05-01 ENCOUNTER — Encounter: Payer: Self-pay | Admitting: Nurse Practitioner

## 2017-05-01 VITALS — BP 122/68 | HR 93 | Temp 98.0°F | Ht 68.5 in | Wt 237.8 lb

## 2017-05-01 DIAGNOSIS — R7989 Other specified abnormal findings of blood chemistry: Secondary | ICD-10-CM

## 2017-05-01 DIAGNOSIS — E781 Pure hyperglyceridemia: Secondary | ICD-10-CM | POA: Insufficient documentation

## 2017-05-01 DIAGNOSIS — I129 Hypertensive chronic kidney disease with stage 1 through stage 4 chronic kidney disease, or unspecified chronic kidney disease: Secondary | ICD-10-CM | POA: Insufficient documentation

## 2017-05-01 DIAGNOSIS — I10 Essential (primary) hypertension: Secondary | ICD-10-CM

## 2017-05-01 DIAGNOSIS — K601 Chronic anal fissure: Secondary | ICD-10-CM | POA: Insufficient documentation

## 2017-05-01 DIAGNOSIS — Z79899 Other long term (current) drug therapy: Secondary | ICD-10-CM | POA: Insufficient documentation

## 2017-05-01 DIAGNOSIS — L609 Nail disorder, unspecified: Secondary | ICD-10-CM

## 2017-05-01 DIAGNOSIS — E669 Obesity, unspecified: Secondary | ICD-10-CM | POA: Insufficient documentation

## 2017-05-01 DIAGNOSIS — Z6835 Body mass index (BMI) 35.0-35.9, adult: Secondary | ICD-10-CM | POA: Insufficient documentation

## 2017-05-01 DIAGNOSIS — L988 Other specified disorders of the skin and subcutaneous tissue: Secondary | ICD-10-CM | POA: Insufficient documentation

## 2017-05-01 DIAGNOSIS — N189 Chronic kidney disease, unspecified: Secondary | ICD-10-CM | POA: Insufficient documentation

## 2017-05-01 NOTE — Progress Notes (Signed)
CC: "white fingertips"   HPI [name] [name] is a 36 y.o. male here today with concerns of change in color of the fingertips of both hands. He also has a history of HTN, HPL and GI disorder.    Fingernail abnormality Onset of discoloration x 6 weeks. He denies working in large amounts of water or any recent injury to trauma to his hands. He denies taking any OTC medications or changes in recent medications. He does have a history of hypoalbuminemia with elevated Alk phosphatase as well as low H/H. He has been evaluated and treated by Gastroenterology this year for anal fissures and rectal bleeding. He has been lost to follow up and missed his appointment in September of this year. He would like to reschedule with GI and I strongly feel this is needed.   Hypertension His blood pressure is well controlled on lisinopril/HCTZ. He endorses medication compliance. Diet is not heart healthy. He is obese with BMI >35. His risk factors for CAD include gender, weight, HTN and increased triglycerides. BP Readings from Last 3 Encounters:  05/01/17 122/68  02/15/17 122/65  01/03/17 122/70      Hypertriglyceridemia He takes omega 3 fatty acids and Lipitor as prescribed. He is not fasting. Will not obtain lipid panel today. Denies myalgias or statin intolerance.    Lab Results  Component Value Date   CHOL 201 (H) 03/28/2016   Lab Results  Component Value Date   HDL 47 03/28/2016   Lab Results  Component Value Date   LDLCALC 88 03/28/2016   Lab Results  Component Value Date   TRIG 330 (H) 03/28/2016   Lab Results  Component Value Date   CHOLHDL 4.3 03/28/2016    ALLERGIES: Allergies  Allergen Reactions  . Doxycycline Rash    PAST MEDICAL HISTORY: Past Medical History:  Diagnosis Date  . Allergy   . Chronic kidney disease   . Hyperlipidemia   . Hypertension     SOCIAL HISTORY Social History   Social History  . Marital status: Legally Separated    Spouse name: N/A  .  Number of children: N/A  . Years of education: N/A   Occupational History  . Not on file.   Social History Main Topics  . Smoking status: Never Smoker  . Smokeless tobacco: Never Used  . Alcohol use 4.8 - 6.0 oz/week    8 - 10 Cans of beer per week     Comment: occ   . Drug use: No  . Sexual activity: Yes    Partners: Female   Other Topics Concern  . Not on file   Social History Narrative  . No narrative on file    FAMILY HISTORY Family History  Problem Relation Age of Onset  . Hypertension Father   . Leukemia Daughter        age 70 yrs old   . Colon cancer Neg Hx   . Stomach cancer Neg Hx   . Esophageal cancer Neg Hx   . Rectal cancer Neg Hx      MEDICATIONS AT HOME: Prior to Admission medications   Medication Sig Start Date End Date Taking? Authorizing Provider  AMBULATORY NON FORMULARY MEDICATION Place 0.125 mg rectally 2 (two) times daily. Medication Name: Nitroglycerin ointment 11/22/16   Zehr, Janett Billow D, PA-C  AMBULATORY NON FORMULARY MEDICATION Nitroglycerin ointment 0.125%. Take three times a day per rectum (use pea size amount) for 2 months. 01/03/17   Mauri Pole, MD  atorvastatin (LIPITOR) 10  MG tablet Take 1 tablet (10 mg total) by mouth daily. 01/02/17   Arnoldo Morale, MD  clotrimazole (LOTRIMIN) 1 % cream Apply 1 application topically 2 (two) times daily. Patient not taking: Reported on 02/15/2017 01/02/17   Arnoldo Morale, MD  lisinopril-hydrochlorothiazide (PRINZIDE,ZESTORETIC) 10-12.5 MG tablet Take 1 tablet by mouth daily. 01/02/17   Arnoldo Morale, MD  Omega-3 Fatty Acids (FISH OIL) 500 MG CAPS Take 1 each by mouth daily.    [provider]      Review of Systems: Constitutional: Negative for fever, chills, diaphoresis, activity change, appetite change and fatigue. HENT: Negative for ear pain, nosebleeds, congestion, facial swelling, rhinorrhea, neck pain, neck stiffness and ear discharge. Endorses decreased hearing in Left ear. Eyes:  Negative for pain, discharge, redness, itching and visual disturbance. Respiratory: Negative for cough, choking, chest tightness, shortness of breath, wheezing and stridor.  Cardiovascular: Negative for chest pain, palpitations and leg swelling. Gastrointestinal: Negative for abdominal distention. Genitourinary: Negative for dysuria, urgency, frequency, hematuria, flank pain, decreased urine volume, difficulty urinating and dyspareunia.  Musculoskeletal: Negative for back pain, joint swelling, arthralgias and gait problem. Neurological: Negative for dizziness, tremors, seizures, syncope, facial asymmetry, speech difficulty, weakness, light-headedness, numbness and headaches.  Hematological: Negative for adenopathy. Does not bruise/bleed easily. Psychiatric/Behavioral: Negative for hallucinations, behavioral problems, confusion, dysphoric mood, decreased concentration and agitation.    Objective:   Vitals:   05/01/17 1545  BP: 122/68  Pulse: 93  Temp: 98 F (36.7 C)  SpO2: 100%    Physical Exam: Constitutional: Patient appears well-developed and well-nourished. No distress. HENT: Normocephalic, atraumatic, External right and left ear normal. Oropharynx is clear and moist.  Eyes: Conjunctivae and EOM are normal. PERRLA, no scleral icterus. Neck: Normal ROM. Neck supple. No JVD. No tracheal deviation. No thyromegaly. CVS: RRR, S1/S2 +, no murmurs, no gallops, no carotid bruit.  Pulmonary: Effort and breath sounds normal, no stridor, rhonchi, wheezes, rales.  Abdominal: Soft. BS +,  no distension, tenderness, rebound or guarding.  Musculoskeletal: Normal range of motion. No edema and no tenderness.  Neuro: Alert. Normal reflexes, muscle tone coordination. No cranial nerve deficit. Skin: Skin is warm and dry. No rash noted. Not diaphoretic. No erythema. Nails of bilateral hands with stark white appearance.  Difficult to assess for capillary refill in nails with discoloration. A few of the  nails that are not fully discolored have capillary refill <3 seconds. Hands are warm to touch. There is no obvious concavity, raised or pitted edges.  Psychiatric: Normal mood and affect. Behavior, judgment, thought content normal.  Lab Results  Component Value Date   WBC 14.6 (H) 07/23/2016   HGB 9.9 (L) 10/21/2016   HCT 29.0 (L) 10/21/2016   MCV 87.9 07/23/2016   PLT 580 (H) 07/23/2016   Lab Results  Component Value Date   CREATININE 0.86 01/02/2017   BUN 13 01/02/2017   NA 139 01/02/2017   K 4.4 01/02/2017   CL 98 01/02/2017   CO2 22 01/02/2017   Lab Results  Component Value Date   HGBA1C 5.4 03/29/2016     Lipid Panel     Component Value Date/Time   CHOL 201 (H) 03/28/2016 1229   TRIG 330 (H) 03/28/2016 1229   HDL 47 03/28/2016 1229   CHOLHDL 4.3 03/28/2016 1229   VLDL 66 (H) 03/28/2016 1229   LDLCALC 88 03/28/2016 1229        Assessment and plan:     Diagnoses and all orders for this visit:  Fingernail abnormalities -  CBC -     Iron, TIBC and Ferritin Panel -     B12 and Folate Panel -     Zinc -     Vitamin E  Essential hypertension Continue all antihypertensives as prescribed. Remember to bring a blood pressure log of your blood pressure readings to your follow up appointments. Drink at least 64 ounces of water per day. DASH DIET.  Chronic anal fissure -     CBC  Hypertriglyceridemia Continue statin and omega 3 as instructed. Exercise at least 150 minutes per week. Follow a diet that is low in salt, fat and cholesterol. Avoid takeout and fried foods. No refills required today.   Abnormal CBC measurement -     Iron, TIBC and Ferritin Panel -     B12 and Folate Panel -     Zinc -     Vitamin E     Patient has been counseled extensively about nutrition and exercise. Other issues discussed during this visit include: low cholesterol diet, weight control with exercise at least 150 minutes per week, foot care, annual eye examinations at  Ophthalmology, importance of adherence with medications and regular follow-up.    Return has appt already with amao on 11-2. please reschedule. He would like to see Dr. Jarold Song when she returns.  The patient was given clear instructions to go to ER or return to medical center if symptoms don't improve, worsen or new problems develop. The patient verbalized understanding.      Gildardo Pounds, FNP-BC, [facility] [place], [place]

## 2017-05-01 NOTE — Patient Instructions (Addendum)
[facility]: Marsa ArisKavitha nandigam Doctor in [place]  Address: [address], [place], [place] [address]  Phone: [phone]   Extraccin de uas de las manos o los pies (Fingernail or Toenail Removal) La extraccin de uas de las manos o los pies es un procedimiento quirrgico que se realiza para extirpar una ua de la mano o del pie. Tal vez deban extraerle una ua de la mano o del pie si su forma es anormal (deformidad) o est gravemente lesionada. Tambin puede hacerse la extraccin si se ha producido una infeccin bacteriana, una ua del pie est muy encarnada o hay una infeccin por hongos que no respondi al tratamiento con antimicticos. INFORME A SU MDICO:  Cualquier alergia que tenga.  Todos los Walt Disneymedicamentos que utiliza, incluidos vitaminas, hierbas, gotas oftlmicas, cremas y [address]icamentos de [address] libre.  Problemas previos que usted o los Graybar Electricmiembros de su familia hayan tenido con el uso de anestsicos.  Enfermedades de la sangre que tenga.  Si tiene cirugas previas.  Si tiene Owens-Illinoisalguna enfermedad. RIESGOS Y COMPLICACIONES En general, se trata de un procedimiento seguro. Sin embargo, pueden presentarse problemas, por ejemplo:  Dolor.  Hemorragia.  Infeccin.  Vuelve a crecer una ua deformada. ANTES DEL PROCEDIMIENTO  Consulte a su mdico si debe cambiar o suspender los medicamentos que toma habitualmente. Esto es muy importante si toma medicamentos para la diabetes o anticoagulantes.  Siga las indicaciones del mdico respecto de las restricciones para las comidas o las bebidas.  Haga planes para que una persona lo lleve de vuelta a su casa despus del procedimiento. PROCEDIMIENTO  Se colocar una va intravenosa (IV) en una de las venas.  Podrn administrarle uno o ms de los siguientes medicamentos: ? Un medicamento para ayudarlo a relajarse (sedante). ? Un medicamento que adormece el rea (anestesia local).  Una vez anestesiado el  dedo del pie o de la [place], [place] mdico introducir un instrumento de punta roma debajo de la ua para despegarla.  En algunos casos, tambin puede hacer un corte (incisin) en la ua.  Despus de despegar la ua del dedo del pie o de la mano, el mdico la desprender del lecho ungueal.  Se colocar un apsito con solucin antisptica (vendaje antisptico) en el dedo del pie o la mano. Este procedimiento puede variar segn el mdico y el hospital. DESPUS DEL PROCEDIMIENTO  Conley RollsLe controlarn con frecuencia la presin arterial, la frecuencia cardaca, la frecuencia respiratoria y Air cabin crewel nivel de oxgeno en la sangre hasta que haya desaparecido el efecto de los medicamentos administrados.  Es normal sentir un poco de dolor despus de la extraccin de uas. Le darn medicamentos para el dolor si los necesita.  Tal vez le receten analgsicos y antibiticos.  Si le extrajeron una ua del pie, le indicarn que use un zapato ortopdico mientras se recupera.  Si le extrajeron una ua de la mano, tal vez le indiquen que use una frula [place] Parkmientras se recupera. Esta informacin no tiene Theme park managercomo fin reemplazar el consejo del mdico. Asegrese de hacerle al mdico cualquier pregunta que tenga. Document Released: 10/06/2008 Document Revised: 11/04/2014 Elsevier Interactive Patient Education  2017 [employer]

## 2017-05-02 LAB — SPECIMEN STATUS REPORT

## 2017-05-04 ENCOUNTER — Telehealth: Payer: Self-pay

## 2017-05-04 LAB — VITAMIN E
VITAMIN E(GAMMA TOCOPHEROL): 1.9 mg/L (ref 0.7–4.9)
Vitamin E (Alpha Tocopherol): 16.2 mg/L (ref 5.9–19.4)

## 2017-05-04 LAB — CBC
HEMATOCRIT: 40.7 % (ref 37.5–51.0)
Hemoglobin: 12.7 g/dL — ABNORMAL LOW (ref 13.0–17.7)
MCH: 21.2 pg — AB (ref 26.6–33.0)
MCHC: 31.2 g/dL — AB (ref 31.5–35.7)
MCV: 68 fL — AB (ref 79–97)
Platelets: 292 10*3/uL (ref 150–379)
RBC: 6 x10E6/uL — ABNORMAL HIGH (ref 4.14–5.80)
RDW: 23.5 % — AB (ref 12.3–15.4)
WBC: 9.8 10*3/uL (ref 3.4–10.8)

## 2017-05-04 LAB — ZINC

## 2017-05-04 LAB — IRON,TIBC AND FERRITIN PANEL
Ferritin: 7 ng/mL — ABNORMAL LOW (ref 30–400)
Iron Saturation: 5 % — CL (ref 15–55)
Iron: 27 ug/dL — ABNORMAL LOW (ref 38–169)
Total Iron Binding Capacity: 552 ug/dL (ref 250–450)
UIBC: 525 ug/dL — ABNORMAL HIGH (ref 111–343)

## 2017-05-04 LAB — B12 AND FOLATE PANEL
FOLATE: 15.5 ng/mL (ref >3.0)
Vitamin B-12: 461 pg/mL (ref 232–1245)

## 2017-05-04 NOTE — Telephone Encounter (Signed)
Pt was called and informed of lab results via interpreter. 

## 2017-05-05 ENCOUNTER — Ambulatory Visit: Payer: Self-pay | Attending: Family Medicine

## 2017-05-10 ENCOUNTER — Telehealth: Payer: Self-pay

## 2017-05-11 NOTE — Telephone Encounter (Signed)
Pt called to speak with the nurse since he was told the rest of the lab result will given to him via phone on Monday 05/08/17 and so far no one call him please call him back after 4pm with an interpreter

## 2017-05-12 ENCOUNTER — Telehealth: Payer: Self-pay

## 2017-05-12 NOTE — Telephone Encounter (Signed)
Pt was called via interpreter and a message was left informing pt to return phone call for lab results.

## 2017-05-16 ENCOUNTER — Telehealth: Payer: Self-pay | Admitting: Family Medicine

## 2017-05-16 NOTE — Telephone Encounter (Signed)
Pt was given the result in spanish auth by the nurse

## 2017-05-16 NOTE — Telephone Encounter (Signed)
Pt called to speak with the nurse for lab result since she call him , please call him back after 4pm since he is at work, spanish pt, please follow up

## 2017-06-23 ENCOUNTER — Ambulatory Visit: Payer: Self-pay | Attending: Family Medicine | Admitting: Family Medicine

## 2017-06-23 ENCOUNTER — Encounter: Payer: Self-pay | Admitting: Family Medicine

## 2017-06-23 VITALS — BP 133/72 | HR 77 | Temp 97.9°F | Ht 68.0 in | Wt 239.0 lb

## 2017-06-23 DIAGNOSIS — I10 Essential (primary) hypertension: Secondary | ICD-10-CM

## 2017-06-23 DIAGNOSIS — I129 Hypertensive chronic kidney disease with stage 1 through stage 4 chronic kidney disease, or unspecified chronic kidney disease: Secondary | ICD-10-CM | POA: Insufficient documentation

## 2017-06-23 DIAGNOSIS — Z9889 Other specified postprocedural states: Secondary | ICD-10-CM | POA: Insufficient documentation

## 2017-06-23 DIAGNOSIS — Z79899 Other long term (current) drug therapy: Secondary | ICD-10-CM | POA: Insufficient documentation

## 2017-06-23 DIAGNOSIS — E7800 Pure hypercholesterolemia, unspecified: Secondary | ICD-10-CM | POA: Insufficient documentation

## 2017-06-23 DIAGNOSIS — Z131 Encounter for screening for diabetes mellitus: Secondary | ICD-10-CM

## 2017-06-23 DIAGNOSIS — Z23 Encounter for immunization: Secondary | ICD-10-CM | POA: Insufficient documentation

## 2017-06-23 DIAGNOSIS — N189 Chronic kidney disease, unspecified: Secondary | ICD-10-CM | POA: Insufficient documentation

## 2017-06-23 DIAGNOSIS — L608 Other nail disorders: Secondary | ICD-10-CM | POA: Insufficient documentation

## 2017-06-23 DIAGNOSIS — Z881 Allergy status to other antibiotic agents status: Secondary | ICD-10-CM | POA: Insufficient documentation

## 2017-06-23 LAB — POCT GLYCOSYLATED HEMOGLOBIN (HGB A1C): Hemoglobin A1C: 5.7

## 2017-06-23 MED ORDER — ATORVASTATIN CALCIUM 10 MG PO TABS: 10.0000 mg | ORAL_TABLET | Freq: Every day | ORAL | 6 refills | Status: DC

## 2017-06-23 MED ORDER — LISINOPRIL-HYDROCHLOROTHIAZIDE 10-12.5 MG PO TABS: 1.0000 | ORAL_TABLET | Freq: Every day | ORAL | 6 refills | Status: DC

## 2017-06-23 NOTE — Progress Notes (Signed)
Subjective:  Patient ID: [name] [name], male    DOB: 02-05-1981  Age: 36 y.o. MRN: [id number]  CC: Hypertension   HPI [name] [name] is a 36 year old male with a history of hypertension, hyperlipidemia who presents today for a follow-up visit.  He has been compliant with his antihypertensive and statin and denies myalgias or other side effects.  He complains of whitish discoloration of his fingernails all over the last 7-8 months and was seen for the same symptoms 2 months ago with PA. Lab work revealed hematocrit of 40.7, decreased ferritin of 7, increased TIBC of 552; B12, zinc and vitamin E levels were normal. He denies joint pains, joint swelling.  He endorses some intermittent burning for the last few weeks which occurs once in a while on his left side and his [place] extremities but he has no underlying history of diabetes mellitus.  Past Medical History:  Diagnosis Date  . Allergy   . Chronic kidney disease   . Hyperlipidemia   . Hypertension     Past Surgical History:  Procedure Laterality Date  . CYSTOSCOPY W/ RETROGRADES Left 07/08/2016   Procedure: CYSTOSCOPY WITH RETROGRADE PYELOGRAM AND STENT PLACEMENT;  Surgeon: Sebastian Acheheodore Manny, MD;  Location: [facility];  Service: Urology;  Laterality: Left;  . NO PAST SURGERIES    . ROBOT ASSISTED PYELOPLASTY Left 07/08/2016   Procedure: XI ROBOTIC ASSISTED PYELOPLASTY;  Surgeon: Sebastian Acheheodore Manny, MD;  Location: [facility];  Service: Urology;  Laterality: Left;    Allergies  Allergen Reactions  . Doxycycline Rash     Outpatient Medications Prior to Visit  Medication Sig Dispense Refill  . Omega-3 Fatty Acids (FISH OIL) 500 MG CAPS Take 1 each by mouth daily.    Marland Kitchen. atorvastatin (LIPITOR) 10 MG tablet Take 1 tablet (10 mg total) by mouth daily. 30 tablet 6  . lisinopril-hydrochlorothiazide (PRINZIDE,ZESTORETIC) 10-12.5 MG tablet Take 1 tablet by mouth daily. 30 tablet 6  . AMBULATORY NON FORMULARY MEDICATION Place 0.125 mg rectally 2 (two)  times daily. Medication Name: Nitroglycerin ointment (Patient not taking: Reported on 05/01/2017) 30 g 3  . AMBULATORY NON FORMULARY MEDICATION Nitroglycerin ointment 0.125%. Take three times a day per rectum (use pea size amount) for 2 months. (Patient not taking: Reported on 05/01/2017) 30 g 1  . clotrimazole (LOTRIMIN) 1 % cream Apply 1 application topically 2 (two) times daily. (Patient not taking: Reported on 02/15/2017) 60 g 1   Facility-Administered Medications Prior to Visit  Medication Dose Route Frequency Provider Last Rate Last Dose  . 0.9 %  sodium chloride infusion  500 mL Intravenous Continuous Nandigam, Kavitha V, MD        ROS Review of Systems  Constitutional: Negative for activity change and appetite change.  HENT: Negative for sinus pressure and sore throat.   Eyes: Negative for visual disturbance.  Respiratory: Negative for cough, chest tightness and shortness of breath.   Cardiovascular: Negative for chest pain and leg swelling.  Gastrointestinal: Negative for abdominal distention, abdominal pain, constipation and diarrhea.  Endocrine: Negative.   Genitourinary: Negative for dysuria.  Musculoskeletal: Negative for joint swelling and myalgias.  Skin: Negative for rash.  Allergic/Immunologic: Negative.   Neurological: Negative for weakness, light-headedness and numbness.  Psychiatric/Behavioral: Negative for dysphoric mood and suicidal ideas.    Objective:  BP 133/72   Pulse 77   Temp 97.9 F (36.6 C) (Oral)   Ht 5\' 8"  (1.727 m)   Wt 239 lb (108.4 kg)   SpO2 98%   BMI  36.34 kg/m   BP/Weight 06/23/2017 05/01/2017 02/15/2017  Systolic BP 133 122 122  Diastolic BP 72 68 65  Wt. (Lbs) 239 237.8 233  BMI 36.34 35.63 34.91      Physical Exam  Constitutional: He is oriented to person, place, and time. He appears well-developed and well-nourished.  Cardiovascular: Normal rate, normal heart sounds and intact distal pulses.  No murmur heard. Pulmonary/Chest:  Effort normal and breath sounds normal. He has no wheezes. He has no rales. He exhibits no tenderness.  Abdominal: Soft. Bowel sounds are normal. He exhibits no distension and no mass. There is no tenderness.  Musculoskeletal: Normal range of motion.  Neurological: He is alert and oriented to person, place, and time.  Skin:  Whitish discoloration of left index and middle finger and right index finger with early discoloration of all the fingers at the nailbed  Psychiatric: He has a normal mood and affect.     CMP Latest Ref Rng & Units 01/02/2017 10/21/2016 07/23/2016  Glucose 65 - 99 mg/dL 90 161(W101(H) 960(A113(H)  BUN 6 - 20 mg/dL 13 13 16   Creatinine 0.76 - 1.27 mg/dL 5.400.86 9.810.80 1.910.83  Sodium 134 - 144 mmol/L 139 137 139  Potassium 3.5 - 5.2 mmol/L 4.4 3.7 3.7  Chloride 96 - 106 mmol/L 98 103 107  CO2 20 - 29 mmol/L 22 - 25  Calcium 8.7 - 10.2 mg/dL 9.3 - 8.6(L)  Total Protein 6.5 - 8.1 g/dL - - 6.5  Total Bilirubin 0.3 - 1.2 mg/dL - - 0.6  Alkaline Phos 38 - 126 U/L - - 226(H)  AST 15 - 41 U/L - - 24  ALT 17 - 63 U/L - - 54    Lipid Panel     Component Value Date/Time   CHOL 201 (H) 03/28/2016 1229   TRIG 330 (H) 03/28/2016 1229   HDL 47 03/28/2016 1229   CHOLHDL 4.3 03/28/2016 1229   VLDL 66 (H) 03/28/2016 1229   LDLCALC 88 03/28/2016 1229    Lab Results  Component Value Date   HGBA1C 5.7 06/23/2017    Assessment & Plan:   1. Essential hypertension Controlled Low-sodium diet - lisinopril-hydrochlorothiazide (PRINZIDE,ZESTORETIC) 10-12.5 MG tablet; Take 1 tablet by mouth daily.  Dispense: 30 tablet; Refill: 6  2. Pure hypercholesterolemia Controlled Low-cholesterol diet - atorvastatin (LIPITOR) 10 MG tablet; Take 1 tablet (10 mg total) by mouth daily.  Dispense: 30 tablet; Refill: 6  3. Screening for diabetes mellitus He does complain of intermittent paresthesia and A1c is 5.7 - POCT glycosylated hemoglobin (Hb A1C)  4. Leukonychia Unknown etiology Might need nail  biopsy - Ambulatory referral to Dermatology  5. Need for influenza vaccination - Flu Vaccine QUAD 36+ mos IM   Meds ordered this encounter  Medications  . lisinopril-hydrochlorothiazide (PRINZIDE,ZESTORETIC) 10-12.5 MG tablet    Sig: Take 1 tablet by mouth daily.    Dispense:  30 tablet    Refill:  6  . atorvastatin (LIPITOR) 10 MG tablet    Sig: Take 1 tablet (10 mg total) by mouth daily.    Dispense:  30 tablet    Refill:  6    Follow-up: No Follow-up on file.   Jaclyn ShaggyEnobong Amao MD

## 2017-07-14 ENCOUNTER — Ambulatory Visit (INDEPENDENT_AMBULATORY_CARE_PROVIDER_SITE_OTHER): Payer: Self-pay | Admitting: Gastroenterology

## 2017-07-14 ENCOUNTER — Encounter: Payer: Self-pay | Admitting: Gastroenterology

## 2017-07-14 VITALS — BP 116/76 | HR 88 | Ht 67.75 in | Wt 238.4 lb

## 2017-07-14 DIAGNOSIS — K5900 Constipation, unspecified: Secondary | ICD-10-CM

## 2017-07-14 DIAGNOSIS — K602 Anal fissure, unspecified: Secondary | ICD-10-CM

## 2017-07-14 NOTE — Patient Instructions (Addendum)
Continue Metamucil Increase fluid and fiber intake   Follow up as needed   If you are age 36 or older, your body mass index should be between 23-30. Your Body mass index is 36.51 kg/m. If this is out of the aforementioned range listed, please consider follow up with your Primary Care Provider.  If you are age 264 or younger, your body mass index should be between 19-25. Your Body mass index is 36.51 kg/m. If this is out of the aformentioned range listed, please consider follow up with your Primary Care Provider.

## 2017-07-14 NOTE — Progress Notes (Signed)
[name] [name]    [id number]    [birth year]  Primary Care Physician:Amao, Odette Horns, MD  Referring Physician: Jaclyn Shaggy, MD [address] [place], [place] [address]  Chief complaint: Anal fissure  HPI: 37 year old male here accompanied by Spanish interpreter for follow-up of chronic nonhealing anal fissure.  Colonoscopy with biopsies were negative for IBD or Crohn's disease.  He reports improvement of anorectal discomfort and denies any blood per rectum.  He is taking Metamucil daily with regular soft bowel movement and is avoiding excessive straining or constipation.  Overall feeling better   Outpatient Encounter Medications as of 07/14/2017  Medication Sig  . atorvastatin (LIPITOR) 10 MG tablet Take 1 tablet (10 mg total) by mouth daily.  Marland Kitchen lisinopril-hydrochlorothiazide (PRINZIDE,ZESTORETIC) 10-12.5 MG tablet Take 1 tablet by mouth daily.  . Omega-3 Fatty Acids (FISH OIL) 500 MG CAPS Take 1 each by mouth daily.  . Psyllium (METAMUCIL PO) Take by mouth as needed.  . [DISCONTINUED] AMBULATORY NON FORMULARY MEDICATION Place 0.125 mg rectally 2 (two) times daily. Medication Name: Nitroglycerin ointment (Patient not taking: Reported on 05/01/2017)  . [DISCONTINUED] AMBULATORY NON FORMULARY MEDICATION Nitroglycerin ointment 0.125%. Take three times a day per rectum (use pea size amount) for 2 months. (Patient not taking: Reported on 07/14/2017)  . [DISCONTINUED] atorvastatin (LIPITOR) 10 MG tablet Take 1 tablet (10 mg total) by mouth daily.  . [DISCONTINUED] clotrimazole (LOTRIMIN) 1 % cream Apply 1 application topically 2 (two) times daily. (Patient not taking: Reported on 02/15/2017)  . [DISCONTINUED] lisinopril-hydrochlorothiazide (PRINZIDE,ZESTORETIC) 10-12.5 MG tablet Take 1 tablet by mouth daily.   Facility-Administered Encounter Medications as of 07/14/2017  Medication  . 0.9 %  sodium chloride infusion    Allergies as of 07/14/2017 - Review Complete 07/14/2017    Allergen Reaction Noted  . Doxycycline Rash 02/09/2016    Past Medical History:  Diagnosis Date  . Allergy   . Chronic kidney disease   . Hyperlipidemia   . Hypertension     Past Surgical History:  Procedure Laterality Date  . CYSTOSCOPY W/ RETROGRADES Left 07/08/2016   Procedure: CYSTOSCOPY WITH RETROGRADE PYELOGRAM AND STENT PLACEMENT;  Surgeon: Sebastian Ache, MD;  Location: [facility];  Service: Urology;  Laterality: Left;  . NO PAST SURGERIES    . ROBOT ASSISTED PYELOPLASTY Left 07/08/2016   Procedure: XI ROBOTIC ASSISTED PYELOPLASTY;  Surgeon: Sebastian Ache, MD;  Location: [facility];  Service: Urology;  Laterality: Left;    Family History  Problem Relation Age of Onset  . Hypertension Father   . Leukemia Daughter        age 20 yrs old   . Colon cancer Neg Hx   . Stomach cancer Neg Hx   . Esophageal cancer Neg Hx   . Rectal cancer Neg Hx     Social History   Socioeconomic History  . Marital status: Legally Separated    Spouse name: Not on file  . Number of children: Not on file  . Years of education: Not on file  . Highest education level: Not on file  Social Needs  . Financial resource strain: Not on file  . Food insecurity - worry: Not on file  . Food insecurity - inability: Not on file  . Transportation needs - medical: Not on file  . Transportation needs - non-medical: Not on file  Occupational History  . Not on file  Tobacco Use  . Smoking status: Never Smoker  . Smokeless tobacco: Never  Used  Substance and Sexual Activity  . Alcohol use: Yes    Alcohol/week: 4.8 - 6.0 oz    Types: 8 - 10 Cans of beer per week    Comment: occ   . Drug use: No  . Sexual activity: Yes    Partners: Female  Other Topics Concern  . Not on file  Social History Narrative  . Not on file      Review of systems: Review of Systems  Constitutional: Negative for fever and chills.  HENT: Negative.   Eyes: Negative for blurred vision.  Respiratory: Negative for cough,  shortness of breath and wheezing.   Cardiovascular: Negative for chest pain and palpitations.  Gastrointestinal: as per HPI Genitourinary: Negative for dysuria, urgency, frequency and hematuria.  Musculoskeletal: Negative for myalgias, back pain and joint pain.  Skin: Negative for itching and rash.  Neurological: Negative for dizziness, tremors, focal weakness, seizures and loss of consciousness.  Endo/Heme/Allergies: Positive for seasonal allergies.  Psychiatric/Behavioral: Negative for depression, suicidal ideas and hallucinations.  All other systems reviewed and are negative.   Physical Exam: Vitals:   07/14/17 1438  BP: 116/76  Pulse: 88   Body mass index is 36.51 kg/m. Gen:      No acute distress HEENT:  EOMI, sclera anicteric Neck:     No masses; no thyromegaly Lungs:    Clear to auscultation bilaterally; normal respiratory effort CV:         Regular rate and rhythm; no murmurs Abd:      + bowel sounds; soft, non-tender; no palpable masses, no distension Ext:    No edema; adequate peripheral perfusion Skin:      Warm and dry; no rash Neuro: alert and oriented x 3 Psych: normal mood and affect Rectal exam: Normal anal sphincter tone, no anal fissure or external hemorrhoids Anoscopy: Small internal hemorrhoids, no active bleeding, normal dentate line, no visible nodules Data Reviewed:  Reviewed labs, radiology imaging, old records and pertinent past GI work up   Assessment and Plan/Recommendations:  37 year old male with chronic anal fissure that was nonhealing, has improved Colonoscopy 02/2017 negative for Crohn's disease or inflammatory bowel disease Continue Metamucil Continue high-fiber diet and increased fluid intake Avoid excessive straining rectal trauma Return as needed  15 minutes was spent face-to-face with the patient. Greater than 50% of the time used for counseling as well as treatment plan and follow-up. He had multiple questions which were answered to  his satisfaction  K. Scherry RanVeena Nandigam , MD [phone] Mon-Fri 8a-5p [phone] after 5p, weekends, holidays  CC: Jaclyn ShaggyAmao, Enobong, MD

## 2017-09-21 ENCOUNTER — Other Ambulatory Visit: Payer: Self-pay

## 2017-09-21 ENCOUNTER — Ambulatory Visit (INDEPENDENT_AMBULATORY_CARE_PROVIDER_SITE_OTHER): Payer: Self-pay | Admitting: Family Medicine

## 2017-09-21 VITALS — BP 120/80 | HR 92 | Temp 98.4°F | Wt 234.8 lb

## 2017-09-21 DIAGNOSIS — L608 Other nail disorders: Secondary | ICD-10-CM

## 2017-09-21 NOTE — Progress Notes (Signed)
   HPI 37 year old male who presents as a referral from internal medicine clinic. He says that he has been having white discoloration of his nails for around 7 months. The discoloration was initially on his left index and middle finger but his since started on his middle right finger as well. He states this change has occurred gradually. He has not noticed any trauma to these affected digits. He also complaints of some white discoloration on his right and left big toe as well. 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 He has to wear gloves for around 8 hours per day every day. Nobody else in his family or his coworkers have been having a similar problem.  Workup from internal medicine clinic consisted mainly of lab workup. A vitamin E, Zinc, B12, folate, and iron panel was performed. This labwork was significant for iron deficiency anemia, for which he has been taking oral iron supplementation 325mg  bid. He was sent from internal medicine clinic for further workup. Albumin 3.0 from cmp on 07/23/2016.  CC: White discoloration of nails   ROS:  Review of Systems See HPI for ROS.   CC, SH/smoking status, and VS noted  Objective: BP 120/80   Pulse 92   Temp 98.4 F (36.9 C) (Oral)   Wt 234 lb 12.8 oz (106.5 kg)   SpO2 97%   BMI 35.97 kg/m  Gen: NAD, alert, cooperative, and pleasant. Latino male resting comfortably in chair, no distress Ext: No edema, warm. No rash on extensor surfaces. No obvious focal dermatological changes aside from those on hands and feet. Neuro: Alert and oriented, Speech clear, No gross deficits Hands: White discoloration of left index and middle finger. White discoloration in discrete lines. NO heaped up. No s/s of separation of nail from nail bed. Feet: mild heaping of nails on bilateral big toes. Mild whitish discoloration of both toes   Assessment and plan:  Leukonychia Whitish discoloration in left middle and index  finger. No s/s of nail separation, no heaping consistent with fungal infection. Patient with iron deficiency anemia, and protein calorie malnutrition per prior lab work. No rash on extensor surfaces. Most likely etiology is multifactorial. IDA and protein malnutrition likely contributing as these have both been linked to leukonychia. The other factor likely contributing is profuse sweating into his gloved hands. Would advise patient to return to pcp for follow up labwork to recheck albumin and iron panel when apppopriate. Reversing these abnormalities will likely go a long way to correcting nail color. Also advised patient to change gloves often at work. - frequently change gloves at work - repeat iron panel and albumin when appropriate  - will leave these repeat labs and management up to pcp   No orders of the defined types were placed in this encounter.   No orders of the defined types were placed in this encounter.   Myrene BuddyJacob Maylin Freeburg MD PGY-1 Family Medicine Resident 09/21/2017 3:47 PM   I was present during history physical and assessment plan Agree with above Carney LivingMarshall L Chambliss

## 2017-09-21 NOTE — Patient Instructions (Signed)
You were seen in dermatology clinic for your fingernail discoloration.  This is most likely due to leukonychia and unfortunately there is no treatment.  This may be due to iron deficiency vs work environment exposure as a possible cause.   We would recommend that you change your gloves often while you are working.  Additionally, recommend that you see your regular doctor for follow up of iron deficiency anemia and to make sure your albumin level is normal.  Please call clinic if you have any questions.

## 2017-09-21 NOTE — Assessment & Plan Note (Signed)
Whitish discoloration in left middle and index finger. No s/s of nail separation, no heaping consistent with fungal infection. Patient with iron deficiency anemia, and protein calorie malnutrition per prior lab work. No rash on extensor surfaces. Most likely etiology is multifactorial. IDA and protein malnutrition likely contributing as these have both been linked to leukonychia. The other factor likely contributing is profuse sweating into his gloved hands. Would advise patient to return to pcp for follow up labwork to recheck albumin and iron panel when apppopriate. Reversing these abnormalities will likely go a long way to correcting nail color. Also advised patient to change gloves often at work. - frequently change gloves at work - repeat iron panel and albumin when appropriate  - will leave these repeat labs and management up to pcp

## 2017-09-27 ENCOUNTER — Ambulatory Visit: Payer: Self-pay | Attending: Family Medicine | Admitting: Physician Assistant

## 2017-09-27 VITALS — BP 126/81 | HR 67 | Temp 98.7°F | Resp 16 | Wt 236.0 lb

## 2017-09-27 DIAGNOSIS — L608 Other nail disorders: Secondary | ICD-10-CM | POA: Insufficient documentation

## 2017-09-27 DIAGNOSIS — D649 Anemia, unspecified: Secondary | ICD-10-CM

## 2017-09-27 DIAGNOSIS — N189 Chronic kidney disease, unspecified: Secondary | ICD-10-CM | POA: Insufficient documentation

## 2017-09-27 DIAGNOSIS — H539 Unspecified visual disturbance: Secondary | ICD-10-CM

## 2017-09-27 DIAGNOSIS — Z79899 Other long term (current) drug therapy: Secondary | ICD-10-CM | POA: Insufficient documentation

## 2017-09-27 DIAGNOSIS — Z789 Other specified health status: Secondary | ICD-10-CM

## 2017-09-27 DIAGNOSIS — E785 Hyperlipidemia, unspecified: Secondary | ICD-10-CM | POA: Insufficient documentation

## 2017-09-27 DIAGNOSIS — L609 Nail disorder, unspecified: Secondary | ICD-10-CM

## 2017-09-27 DIAGNOSIS — I129 Hypertensive chronic kidney disease with stage 1 through stage 4 chronic kidney disease, or unspecified chronic kidney disease: Secondary | ICD-10-CM | POA: Insufficient documentation

## 2017-09-27 NOTE — Progress Notes (Signed)
Patient ID: [name] [name], male   DOB: [birth year], 37 y.o.   MRN: [id number]     [name] [name], is a 37 y.o. male  WUJ:[id number]  [id number]  DOB - Feb 02, 1981  Subjective:  Chief Complaint and HPI: [name] [name] is a 37 y.o. male here today  [place] with stratus interpreters translating.    Vision changes over the last 3 months.    Nails not changing.  Needs to get anemia rechecked.  Taking Iron OTC once daily.   ROS:   Constitutional:  No f/c, No night sweats, No unexplained weight loss. EENT:  No hearing changes. No mouth, throat, or ear problems.  Respiratory: No cough, No SOB Cardiac: No CP, no palpitations GI:  No abd pain, No N/V/D. GU: No Urinary s/sx Musculoskeletal: No joint pain Neuro: No headache, no dizziness, no motor weakness.  Skin: No rash Endocrine:  No polydipsia. No polyuria.  Psych: Denies SI/HI  No problems updated.  ALLERGIES: Allergies  Allergen Reactions  . Doxycycline Rash    PAST MEDICAL HISTORY: Past Medical History:  Diagnosis Date  . Allergy   . Chronic kidney disease   . Hyperlipidemia   . Hypertension     MEDICATIONS AT HOME: Prior to Admission medications   Medication Sig Start Date End Date Taking? Authorizing Provider  atorvastatin (LIPITOR) 10 MG tablet Take 1 tablet (10 mg total) by mouth daily. 06/23/17   [name] Register, MD  lisinopril-hydrochlorothiazide (PRINZIDE,ZESTORETIC) 10-12.5 MG tablet Take 1 tablet by mouth daily. 06/23/17   [name] Register, MD  Omega-3 Fatty Acids (FISH OIL) 500 MG CAPS Take 1 each by mouth daily.    [provider]  Psyllium (METAMUCIL PO) Take by mouth as needed.    [provider]     Objective:  EXAM:   Vitals:   09/27/17 1636  BP: 126/81  Pulse: 67  Resp: 16  Temp: 98.7 F (37.1 C)  TempSrc: Oral  SpO2: 98%  Weight: 236 lb (107 kg)    General appearance : A&OX3. NAD. Non-toxic-appearing HEENT: Atraumatic and Normocephalic.  PERRLA. EOM intact. Fundi  benign. TM clear B. Mouth-MMM, post pharynx WNL w/o erythema, No PND. Neck: supple, no JVD. No cervical lymphadenopathy. No thyromegaly Chest/Lungs:  Breathing-non-labored, Good air entry bilaterally, breath sounds normal without rales, rhonchi, or wheezing  CVS: S1 S2 regular, no murmurs, gallops, rubs  Extremities: Bilateral Lower Ext shows no edema, both legs are warm to touch with = pulse throughout White streaking of nails.  No pitting.   Neurology:  CN II-XII grossly intact, Non focal.   Psych:  TP linear. J/I WNL. Normal speech. Appropriate eye contact and affect.  Skin:  No Rash  Data Review Lab Results  Component Value Date   HGBA1C 5.7 06/23/2017   HGBA1C 5.4 03/29/2016     Assessment & Plan   1. Fingernail abnormalities - Iron, TIBC and Ferritin Panel - CBC with Differential/Platelet - Vitamin D, 25-hydroxy  2. Anemia, unspecified type Taking iron once daily - Iron, TIBC and Ferritin Panel - CBC with Differential/Platelet  3. Vision changes Not acute-may need glasses.  - Ambulatory referral to Ophthalmology  4. Language barrier stratus interpreters used and additional time performing visit was required.   Patient have been counseled extensively about nutrition and exercise  Return in about 2 months (around 11/27/2017) for Dr Alvis Lemmings; anemia f/up.  The patient was given clear instructions to go to ER or return to medical center if symptoms don't improve, worsen or new problems develop.  The patient verbalized understanding. The patient was told to call to get lab results if they haven't heard anything in the next week.     Georgian CoAngela Cheryl Chay, PA-C [facility] [place], [place] [phone]   09/27/2017, 5:06 PM

## 2017-09-28 ENCOUNTER — Telehealth: Payer: Self-pay

## 2017-09-28 ENCOUNTER — Other Ambulatory Visit: Payer: Self-pay | Admitting: Physician Assistant

## 2017-09-28 LAB — CBC WITH DIFFERENTIAL/PLATELET
BASOS ABS: 0 10*3/uL (ref 0.0–0.2)
Basos: 0 %
EOS (ABSOLUTE): 0.2 10*3/uL (ref 0.0–0.4)
Eos: 3 %
Hematocrit: 43.5 % (ref 37.5–51.0)
Hemoglobin: 15.2 g/dL (ref 13.0–17.7)
IMMATURE GRANS (ABS): 0 10*3/uL (ref 0.0–0.1)
IMMATURE GRANULOCYTES: 0 %
LYMPHS: 29 %
Lymphocytes Absolute: 2.2 10*3/uL (ref 0.7–3.1)
MCH: 28.1 pg (ref 26.6–33.0)
MCHC: 34.9 g/dL (ref 31.5–35.7)
MCV: 81 fL (ref 79–97)
Monocytes Absolute: 0.5 10*3/uL (ref 0.1–0.9)
Monocytes: 6 %
NEUTROS PCT: 62 %
Neutrophils Absolute: 4.6 10*3/uL (ref 1.4–7.0)
PLATELETS: 246 10*3/uL (ref 150–379)
RBC: 5.4 x10E6/uL (ref 4.14–5.80)
RDW: 15.6 % — ABNORMAL HIGH (ref 12.3–15.4)
WBC: 7.5 10*3/uL (ref 3.4–10.8)

## 2017-09-28 LAB — IRON,TIBC AND FERRITIN PANEL
FERRITIN: 74 ng/mL (ref 30–400)
IRON SATURATION: 13 % — AB (ref 15–55)
Iron: 48 ug/dL (ref 38–169)
Total Iron Binding Capacity: 384 ug/dL (ref 250–450)
UIBC: 336 ug/dL (ref 111–343)

## 2017-09-28 LAB — VITAMIN D 25 HYDROXY (VIT D DEFICIENCY, FRACTURES): VIT D 25 HYDROXY: 11 ng/mL — AB (ref 30.0–100.0)

## 2017-09-28 MED ORDER — VITAMIN D (ERGOCALCIFEROL) 1.25 MG (50000 UNIT) PO CAPS: 50000.0000 [IU] | ORAL_CAPSULE | ORAL | 0 refills | Status: DC

## 2017-09-28 NOTE — Telephone Encounter (Signed)
CMA call regarding lab results  Patient did not answer & no VM set up  

## 2017-09-28 NOTE — Telephone Encounter (Signed)
Please call pt back to give results sometime after 4:00 in the afternoon(that's when he gets out of work).Thanks

## 2017-09-28 NOTE — Telephone Encounter (Signed)
-----   Message from Particia LatherJay'A R Pollock, [place] sent at 09/28/2017  8:58 AM EDT -----   ----- Message ----- From: Anders SimmondsMcClung, Angela M, PA-C Sent: 09/28/2017   8:21 AM To: Particia LatherJay'A R Pollock, RMA  Please call patient and let them that their vitamin D is low.  This can contribute to muscle aches, anxiety, fatigue, and depression.  I have sent a prescription to the pharmacy for them to take once a week.  We will recheck this level in 3-4 months.  His Iron levels are back to normal.  He should continue taking 1 Iron tablet daily as he is currently taking it. Thanks, Georgian CoAngela McClung, PA-C

## 2017-10-02 NOTE — Telephone Encounter (Signed)
Patient call regarding lab results   Patient was aware and understood

## 2017-10-06 ENCOUNTER — Ambulatory Visit: Payer: Self-pay | Attending: Family Medicine

## 2017-11-24 ENCOUNTER — Other Ambulatory Visit: Payer: Self-pay

## 2017-11-24 ENCOUNTER — Ambulatory Visit: Payer: Self-pay | Attending: Family Medicine | Admitting: Family Medicine

## 2017-11-24 ENCOUNTER — Encounter: Payer: Self-pay | Admitting: Family Medicine

## 2017-11-24 VITALS — BP 124/80 | HR 98 | Temp 98.0°F | Resp 16 | Wt 228.0 lb

## 2017-11-24 DIAGNOSIS — I129 Hypertensive chronic kidney disease with stage 1 through stage 4 chronic kidney disease, or unspecified chronic kidney disease: Secondary | ICD-10-CM | POA: Insufficient documentation

## 2017-11-24 DIAGNOSIS — I10 Essential (primary) hypertension: Secondary | ICD-10-CM

## 2017-11-24 DIAGNOSIS — E7800 Pure hypercholesterolemia, unspecified: Secondary | ICD-10-CM | POA: Insufficient documentation

## 2017-11-24 DIAGNOSIS — N189 Chronic kidney disease, unspecified: Secondary | ICD-10-CM | POA: Insufficient documentation

## 2017-11-24 DIAGNOSIS — E559 Vitamin D deficiency, unspecified: Secondary | ICD-10-CM | POA: Insufficient documentation

## 2017-11-24 DIAGNOSIS — Z881 Allergy status to other antibiotic agents status: Secondary | ICD-10-CM | POA: Insufficient documentation

## 2017-11-24 DIAGNOSIS — Z79899 Other long term (current) drug therapy: Secondary | ICD-10-CM | POA: Insufficient documentation

## 2017-11-24 DIAGNOSIS — L608 Other nail disorders: Secondary | ICD-10-CM | POA: Insufficient documentation

## 2017-11-24 MED ORDER — LISINOPRIL-HYDROCHLOROTHIAZIDE 10-12.5 MG PO TABS: 1.0000 | ORAL_TABLET | Freq: Every day | ORAL | 6 refills | Status: DC

## 2017-11-24 MED ORDER — ATORVASTATIN CALCIUM 10 MG PO TABS: 10.0000 mg | ORAL_TABLET | Freq: Every day | ORAL | 6 refills | Status: DC

## 2017-11-24 MED ORDER — VITAMIN D (ERGOCALCIFEROL) 1.25 MG (50000 UNIT) PO CAPS: 50000.0000 [IU] | ORAL_CAPSULE | ORAL | 1 refills | Status: DC

## 2017-11-24 NOTE — Progress Notes (Signed)
Subjective:  Patient ID: [name] [name], male    DOB: 23-Jun-1981  Age: 37 y.o. MRN: [id number]  CC: Follow-up   HPI [name] [name] is a 37 year old male with a history of hypertension, hyperlipidemia who presents today for a follow-up visit. He has been compliant with his statin and antihypertensive and is requesting refills today. At his last visit he was diagnosed with vitamin D deficiency and took a 1 month course of Drisdol but got no refills after. He has complained of leukonychia for which I referred him to dermatology and he was sent to family medicine and he informs me they had told him they did not know what was causing the discoloration of his nail. He has no additional concerns today.  Past Medical History:  Diagnosis Date  . Allergy   . Chronic kidney disease   . Hyperlipidemia   . Hypertension     Past Surgical History:  Procedure Laterality Date  . CYSTOSCOPY W/ RETROGRADES Left 07/08/2016   Procedure: CYSTOSCOPY WITH RETROGRADE PYELOGRAM AND STENT PLACEMENT;  Surgeon: Alexis Frock, MD;  Location: [facility];  Service: Urology;  Laterality: Left;  . NO PAST SURGERIES    . ROBOT ASSISTED PYELOPLASTY Left 07/08/2016   Procedure: XI ROBOTIC ASSISTED PYELOPLASTY;  Surgeon: Alexis Frock, MD;  Location: [facility];  Service: Urology;  Laterality: Left;    Allergies  Allergen Reactions  . Doxycycline Rash     Outpatient Medications Prior to Visit  Medication Sig Dispense Refill  . Omega-3 Fatty Acids (FISH OIL) 500 MG CAPS Take 1 each by mouth daily.    . Psyllium (METAMUCIL PO) Take by mouth as needed.    Marland Kitchen atorvastatin (LIPITOR) 10 MG tablet Take 1 tablet (10 mg total) by mouth daily. 30 tablet 6  . lisinopril-hydrochlorothiazide (PRINZIDE,ZESTORETIC) 10-12.5 MG tablet Take 1 tablet by mouth daily. 30 tablet 6  . Vitamin D, Ergocalciferol, (DRISDOL) 50000 units CAPS capsule Take 1 capsule (50,000 Units total) by mouth every 7 (seven) days. (Patient not taking: Reported on  11/24/2017) 16 capsule 0   Facility-Administered Medications Prior to Visit  Medication Dose Route Frequency Provider Last Rate Last Dose  . 0.9 %  sodium chloride infusion  500 mL Intravenous Continuous Nandigam, Kavitha V, MD        ROS Review of Systems  Constitutional: Negative for activity change and appetite change.  HENT: Negative for sinus pressure and sore throat.   Eyes: Negative for visual disturbance.  Respiratory: Negative for cough, chest tightness and shortness of breath.   Cardiovascular: Negative for chest pain and leg swelling.  Gastrointestinal: Negative for abdominal distention, abdominal pain, constipation and diarrhea.  Endocrine: Negative.   Genitourinary: Negative for dysuria.  Musculoskeletal: Negative for joint swelling and myalgias.  Skin: Negative for rash.  Allergic/Immunologic: Negative.   Neurological: Negative for weakness, light-headedness and numbness.  Psychiatric/Behavioral: Negative for dysphoric mood and suicidal ideas.    Objective:  BP 124/80 (BP Location: Left Arm, Patient Position: Sitting, Cuff Size: Large)   Pulse 98   Temp 98 F (36.7 C) (Oral)   Resp 16   Wt 228 lb (103.4 kg)   SpO2 98%   BMI 34.92 kg/m   BP/Weight 11/24/2017 09/27/2017 09/29/9240  Systolic BP 683 419 622  Diastolic BP 80 81 80  Wt. (Lbs) 228 236 234.8  BMI 34.92 36.15 35.97      Physical Exam  Constitutional: He is oriented to person, place, and time. He appears well-developed and well-nourished.  Cardiovascular: Normal  rate, normal heart sounds and intact distal pulses.  No murmur heard. Pulmonary/Chest: Effort normal and breath sounds normal. He has no wheezes. He has no rales. He exhibits no tenderness.  Abdominal: Soft. Bowel sounds are normal. He exhibits no distension and no mass. There is no tenderness.  Musculoskeletal: Normal range of motion.  Neurological: He is alert and oriented to person, place, and time.  Skin: Skin is warm and dry.  Whitish  discoloration of bilateral index finger and left middle finger  Psychiatric: He has a normal mood and affect.    CMP Latest Ref Rng & Units 01/02/2017 10/21/2016 07/23/2016  Glucose 65 - 99 mg/dL 90 101(H) 113(H)  BUN 6 - 20 mg/dL '13 13 16  ' Creatinine 0.76 - 1.27 mg/dL 0.86 0.80 0.83  Sodium 134 - 144 mmol/L 139 137 139  Potassium 3.5 - 5.2 mmol/L 4.4 3.7 3.7  Chloride 96 - 106 mmol/L 98 103 107  CO2 20 - 29 mmol/L 22 - 25  Calcium 8.7 - 10.2 mg/dL 9.3 - 8.6(L)  Total Protein 6.5 - 8.1 g/dL - - 6.5  Total Bilirubin 0.3 - 1.2 mg/dL - - 0.6  Alkaline Phos 38 - 126 U/L - - 226(H)  AST 15 - 41 U/L - - 24  ALT 17 - 63 U/L - - 54    CBC    Component Value Date/Time   WBC 7.5 09/27/2017 1723   WBC 14.6 (H) 07/23/2016 0522   RBC 5.40 09/27/2017 1723   RBC 3.21 (L) 07/23/2016 0522   HGB 15.2 09/27/2017 1723   HCT 43.5 09/27/2017 1723   PLT 246 09/27/2017 1723   MCV 81 09/27/2017 1723   MCH 28.1 09/27/2017 1723   MCH 28.7 07/23/2016 0522   MCHC 34.9 09/27/2017 1723   MCHC 32.6 07/23/2016 0522   RDW 15.6 (H) 09/27/2017 1723   LYMPHSABS 2.2 09/27/2017 1723   MONOABS 0.9 07/23/2016 0522   EOSABS 0.2 09/27/2017 1723   BASOSABS 0.0 09/27/2017 1723    Lipid Panel     Component Value Date/Time   CHOL 201 (H) 03/28/2016 1229   TRIG 330 (H) 03/28/2016 1229   HDL 47 03/28/2016 1229   CHOLHDL 4.3 03/28/2016 1229   VLDL 66 (H) 03/28/2016 1229   LDLCALC 88 03/28/2016 1229     Assessment & Plan:   1. Pure hypercholesterolemia Controlled Low-cholesterol diet - atorvastatin (LIPITOR) 10 MG tablet; Take 1 tablet (10 mg total) by mouth daily.  Dispense: 30 tablet; Refill: 6 - Lipid panel; Future  2. Essential hypertension Controlled Low sodium, DASH diet - lisinopril-hydrochlorothiazide (PRINZIDE,ZESTORETIC) 10-12.5 MG tablet; Take 1 tablet by mouth daily.  Dispense: 30 tablet; Refill: 6 - CMP14+EGFR; Future  3. Leukonychia Unknown etiology Labs are negative for iron  deficiency Might benefit from multivitamin supplements with zinc, copper  4. Vitamin D deficiency Refilled Drisdol - VITAMIN D 25 Hydroxy (Vit-D Deficiency, Fractures); Future   Meds ordered this encounter  Medications  . atorvastatin (LIPITOR) 10 MG tablet    Sig: Take 1 tablet (10 mg total) by mouth daily.    Dispense:  30 tablet    Refill:  6  . lisinopril-hydrochlorothiazide (PRINZIDE,ZESTORETIC) 10-12.5 MG tablet    Sig: Take 1 tablet by mouth daily.    Dispense:  30 tablet    Refill:  6  . Vitamin D, Ergocalciferol, (DRISDOL) 50000 units CAPS capsule    Sig: Take 1 capsule (50,000 Units total) by mouth every 7 (seven) days.    Dispense:  9 capsule    Refill:  1    Follow-up: Return in about 6 months (around 05/27/2018) for follow up of chronic medical connditions.   Charlott Rakes MD

## 2017-11-29 ENCOUNTER — Ambulatory Visit: Payer: Self-pay | Attending: Family Medicine

## 2017-11-29 DIAGNOSIS — E7800 Pure hypercholesterolemia, unspecified: Secondary | ICD-10-CM | POA: Insufficient documentation

## 2017-11-29 DIAGNOSIS — E559 Vitamin D deficiency, unspecified: Secondary | ICD-10-CM

## 2017-11-29 DIAGNOSIS — I10 Essential (primary) hypertension: Secondary | ICD-10-CM | POA: Insufficient documentation

## 2017-11-29 NOTE — Progress Notes (Signed)
Patient here for lab visit only 

## 2017-11-30 LAB — LIPID PANEL
CHOLESTEROL TOTAL: 181 mg/dL (ref 100–199)
Chol/HDL Ratio: 3.5 ratio (ref 0.0–5.0)
HDL: 51 mg/dL (ref >39)
LDL Calculated: 85 mg/dL (ref 0–99)
TRIGLYCERIDES: 224 mg/dL — AB (ref 0–149)
VLDL Cholesterol Cal: 45 mg/dL — ABNORMAL HIGH (ref 5–40)

## 2017-11-30 LAB — CMP14+EGFR
A/G RATIO: 1.7 (ref 1.2–2.2)
ALBUMIN: 4.6 g/dL (ref 3.5–5.5)
ALT: 28 IU/L (ref 0–44)
AST: 20 IU/L (ref 0–40)
Alkaline Phosphatase: 88 IU/L (ref 39–117)
BILIRUBIN TOTAL: 0.5 mg/dL (ref 0.0–1.2)
BUN / CREAT RATIO: 13 (ref 9–20)
BUN: 11 mg/dL (ref 6–20)
CHLORIDE: 107 mmol/L — AB (ref 96–106)
CO2: 22 mmol/L (ref 20–29)
Calcium: 9.6 mg/dL (ref 8.7–10.2)
Creatinine, Ser: 0.82 mg/dL (ref 0.76–1.27)
GFR calc non Af Amer: 115 mL/min/{1.73_m2} (ref >59)
GFR, EST AFRICAN AMERICAN: 132 mL/min/{1.73_m2} (ref >59)
GLOBULIN, TOTAL: 2.7 g/dL (ref 1.5–4.5)
GLUCOSE: 90 mg/dL (ref 65–99)
Potassium: 4.2 mmol/L (ref 3.5–5.2)
SODIUM: 136 mmol/L (ref 134–144)
TOTAL PROTEIN: 7.3 g/dL (ref 6.0–8.5)

## 2017-11-30 LAB — VITAMIN D 25 HYDROXY (VIT D DEFICIENCY, FRACTURES): VIT D 25 HYDROXY: 21.8 ng/mL — AB (ref 30.0–100.0)

## 2017-12-06 ENCOUNTER — Telehealth: Payer: Self-pay

## 2017-12-06 NOTE — Telephone Encounter (Signed)
Patient was called and there is no voicemail set up at this time.

## 2017-12-20 ENCOUNTER — Telehealth: Payer: Self-pay | Admitting: Family Medicine

## 2017-12-20 NOTE — Telephone Encounter (Signed)
Patient called requesting his lab results form 11/29/17. Patient states that he takes his lunch break at 12 noon and he only gets 20min. Please f/u

## 2017-12-21 NOTE — Telephone Encounter (Signed)
Patient was called and informed of lab results. 

## 2017-12-22 ENCOUNTER — Telehealth: Payer: Self-pay | Admitting: Family Medicine

## 2017-12-22 NOTE — Telephone Encounter (Signed)
Patient was informed and verbalized understanding of most recent lab results. Patient stated he only has one refill left on the vit d, he was wondering if he would need more refills or just finish supply. Please fu at your earliest convenience.

## 2018-02-27 ENCOUNTER — Ambulatory Visit (HOSPITAL_COMMUNITY)
Admission: EM | Admit: 2018-02-27 | Discharge: 2018-02-27 | Disposition: A | Discharge status: Home / Self Care | Payer: Self-pay | Attending: Family Medicine | Admitting: Family Medicine

## 2018-02-27 ENCOUNTER — Encounter (HOSPITAL_COMMUNITY): Payer: Self-pay | Admitting: Emergency Medicine

## 2018-02-27 DIAGNOSIS — N481 Balanitis: Secondary | ICD-10-CM

## 2018-02-27 MED ORDER — NYSTATIN 100000 UNIT/GM EX CREA: TOPICAL_CREAM | CUTANEOUS | 0 refills | Status: DC

## 2018-02-27 NOTE — ED Triage Notes (Signed)
Pt sts itching to groin area

## 2018-02-27 NOTE — ED Provider Notes (Signed)
[facility]    CSN: [id number] Arrival date & time: 02/27/18  1649     History   Chief Complaint Chief Complaint  Patient presents with  . Exposure to STD    HPI [name] [name] is a 37 y.o. male.   Patient complains of some redness of the glands and tip of the penis.  There is a slight amount of itching.  There is no drainage or discharge.  HPI  Past Medical History:  Diagnosis Date  . Allergy   . Chronic kidney disease   . Hyperlipidemia   . Hypertension     Patient Active Problem List   Diagnosis Date Noted  . Vitamin D deficiency 11/24/2017  . Leukonychia 06/23/2017  . Anal fissure 11/01/2016  . Rectal pain 11/01/2016  . Rectal bleeding 11/01/2016  . Hemorrhoid 08/08/2016  . UTI (urinary tract infection) 07/15/2016  . Hyponatremia 07/15/2016  . Anemia 07/15/2016  . Sepsis (HCC) 07/15/2016  . 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 Hypertension 03/28/2016  . Hyperlipidemia 03/28/2016    Past Surgical History:  Procedure Laterality Date  . CYSTOSCOPY W/ RETROGRADES Left 07/08/2016   Procedure: CYSTOSCOPY WITH RETROGRADE PYELOGRAM AND STENT PLACEMENT;  Surgeon: Sebastian Ache, MD;  Location: [facility];  Service: Urology;  Laterality: Left;  . NO PAST SURGERIES    . ROBOT ASSISTED PYELOPLASTY Left 07/08/2016   Procedure: XI ROBOTIC ASSISTED PYELOPLASTY;  Surgeon: Sebastian Ache, MD;  Location: [facility];  Service: Urology;  Laterality: Left;       Home Medications    Prior to Admission medications   Medication Sig Start Date End Date Taking? Authorizing Provider  atorvastatin (LIPITOR) 10 MG tablet Take 1 tablet (10 mg total) by mouth daily. 11/24/17   Hoy Register, MD  lisinopril-hydrochlorothiazide (PRINZIDE,ZESTORETIC) 10-12.5 MG tablet Take 1 tablet by mouth daily. 11/24/17   Hoy Register, MD  Omega-3 Fatty Acids (FISH OIL) 500 MG CAPS Take 1 each by mouth daily.    [provider]  Psyllium (METAMUCIL PO) Take by mouth  as needed.    [provider]  Vitamin D, Ergocalciferol, (DRISDOL) 50000 units CAPS capsule Take 1 capsule (50,000 Units total) by mouth every 7 (seven) days. 11/24/17   Hoy Register, MD    Family History Family History  Problem Relation Age of Onset  . Hypertension Father   . Leukemia Daughter        age 32 yrs old   . Colon cancer Neg Hx   . Stomach cancer Neg Hx   . Esophageal cancer Neg Hx   . Rectal cancer Neg Hx     Social History Social History   Tobacco Use  . Smoking status: Never Smoker  . Smokeless tobacco: Never Used  Substance Use Topics  . Alcohol use: Yes    Alcohol/week: 8.0 - 10.0 standard drinks    Types: 8 - 10 Cans of beer per week    Comment: occ   . Drug use: No     Allergies   Doxycycline   Review of Systems Review of Systems  Constitutional: Negative for chills and fever.  HENT: Negative for ear pain and sore throat.   Eyes: Negative for pain and visual disturbance.  Respiratory: Negative for cough and shortness of breath.   Cardiovascular: Negative for chest pain and palpitations.  Gastrointestinal: Negative for abdominal pain and vomiting.  Genitourinary: Positive for penile swelling. Negative for dysuria and hematuria.  Musculoskeletal: Negative for arthralgias and back pain.  Skin:  Negative for color change and rash.  Neurological: Negative for seizures and syncope.  All other systems reviewed and are negative.    Physical Exam Triage Vital Signs ED Triage Vitals [02/27/18 1738]  Enc Vitals Group     BP 139/77     Pulse Rate 79     Resp 16     Temp 97.8 F (36.6 C)     Temp Source Oral     SpO2 100 %     Weight      Height      Head Circumference      Peak Flow      Pain Score      Pain Loc      Pain Edu?      Excl. in GC?    No data found.  Updated Vital Signs BP 139/77 (BP Location: Right Arm)   Pulse 79   Temp 97.8 F (36.6 C) (Oral)   Resp 16   SpO2 100%   Visual Acuity Right Eye Distance:     Left Eye Distance:   Bilateral Distance:    Right Eye Near:   Left Eye Near:    Bilateral Near:     Physical Exam  Constitutional: He appears well-developed and well-nourished.  Cardiovascular: Normal rate and regular rhythm.  Pulmonary/Chest: Breath sounds normal.  Genitourinary:  Genitourinary Comments: There is some redness involving the foreskin and glans of the penis consistent with balanitis     UC Treatments / Results  Labs (all labs ordered are listed, but only abnormal results are displayed) Labs Reviewed - No data to display  EKG None  Radiology No results found.  Procedures Procedures (including critical care time)  Medications Ordered in UC Medications - No data to display  Initial Impression / Assessment and Plan / UC Course  I have reviewed the triage vital signs and the nursing notes.  Pertinent labs & imaging results that were available during my care of the patient were reviewed by me and considered in my medical decision making (see chart for details).     Balanitis.  Most likely caused by yeast and moisture.  Will treat with Mycostatin cream. Final Clinical Impressions(s) / UC Diagnoses   Final diagnoses:  None   Discharge Instructions   None    ED Prescriptions    None     Controlled Substance Prescriptions [place] Controlled Substance Registry consulted? No   Frederica KusterMiller, Stephen M, MD 02/27/18 [phone]

## 2018-03-15 ENCOUNTER — Encounter: Payer: Self-pay | Admitting: Family Medicine

## 2018-03-15 ENCOUNTER — Ambulatory Visit: Payer: Self-pay | Attending: Family Medicine | Admitting: Family Medicine

## 2018-03-15 VITALS — BP 132/69 | HR 61 | Temp 97.7°F | Wt 213.6 lb

## 2018-03-15 DIAGNOSIS — E785 Hyperlipidemia, unspecified: Secondary | ICD-10-CM | POA: Insufficient documentation

## 2018-03-15 DIAGNOSIS — L298 Other pruritus: Secondary | ICD-10-CM | POA: Insufficient documentation

## 2018-03-15 DIAGNOSIS — Z881 Allergy status to other antibiotic agents status: Secondary | ICD-10-CM | POA: Insufficient documentation

## 2018-03-15 DIAGNOSIS — N189 Chronic kidney disease, unspecified: Secondary | ICD-10-CM | POA: Insufficient documentation

## 2018-03-15 DIAGNOSIS — I129 Hypertensive chronic kidney disease with stage 1 through stage 4 chronic kidney disease, or unspecified chronic kidney disease: Secondary | ICD-10-CM | POA: Insufficient documentation

## 2018-03-15 DIAGNOSIS — N481 Balanitis: Secondary | ICD-10-CM | POA: Insufficient documentation

## 2018-03-15 DIAGNOSIS — Z113 Encounter for screening for infections with a predominantly sexual mode of transmission: Secondary | ICD-10-CM

## 2018-03-15 DIAGNOSIS — Z79899 Other long term (current) drug therapy: Secondary | ICD-10-CM | POA: Insufficient documentation

## 2018-03-15 DIAGNOSIS — Z23 Encounter for immunization: Secondary | ICD-10-CM

## 2018-03-15 DIAGNOSIS — I10 Essential (primary) hypertension: Secondary | ICD-10-CM

## 2018-03-15 MED ORDER — FLUCONAZOLE 150 MG PO TABS: 150.0000 mg | ORAL_TABLET | Freq: Once | ORAL | 0 refills | Status: AC

## 2018-03-15 MED ORDER — LISINOPRIL-HYDROCHLOROTHIAZIDE 10-12.5 MG PO TABS: 1.0000 | ORAL_TABLET | Freq: Every day | ORAL | 6 refills | Status: DC

## 2018-03-15 MED ORDER — CLOTRIMAZOLE 1 % EX CREA: 1.0000 "application " | TOPICAL_CREAM | Freq: Two times a day (BID) | CUTANEOUS | 0 refills | Status: DC

## 2018-03-15 NOTE — Progress Notes (Signed)
Patient was seen in ED for itching of genitals.

## 2018-03-15 NOTE — Patient Instructions (Signed)
Balanitis  (Balanitis)  La balanitis es la inflamacin de la cabeza del pene (glande).  CAUSAS  Puede tener mltiples causas, tanto infecciosas como no infecciosas. Con frecuencia, la balanitis es el resultado de una higiene personal deficiente, especialmente en los hombres no circuncidados. Sin una higiene adecuada, los virus, las bacterias y los hongos se acumulan entre el prepucio y el glande. Esto puede ocasionar una infeccin. La falta de aire y la irritacin debido a la secrecin normal llamada esmegma contribuyen al problema en los hombres no circuncidados. Otras causas son:   Irritacin qumica por el uso de algunos jabones y geles de ducha (especialmente jabones con perfume), condones, lubricantes personales, vaselina, espermicidas y acondicionadores de telas.   Enfermedades de la piel, como el eczema, la dermatitis y la psoriasis.   Alergias a algunos frmacos, como tetraciclina y sulfas.   Algunas enfermedades como la cirrosis heptica, insuficiencia cardaca congestiva y enfermedades renales.   Obesidad mrbida.  FACTORES DE RIESGO   Diabetes mellitus.   Un prepucio apretado que es difcil de tirar hacia atrs hasta pasar el glande (fimosis).   Tener relaciones sexuales sin usar un condn.    SIGNOS Y SNTOMAS  Los sntomas pueden ser:   Secrecin que proviene de la zona debajo del prepucio.   Sensibilidad.   Picazn e imposibilidad para mantener una ereccin (debido al dolor).   Irritacin y erupcin cutnea.   Llagas en el glande y el prepucio.  DIAGNSTICO  El diagnstico de balanitis se realiza a travs de un examen fsico.  TRATAMIENTO  El tratamiento se basa en la causa. El tratamiento puede incluir:   Lavados frecuentes.   Mantener el glande y el prepucio secos.   El uso de medicamentos, como cremas, analgsicos, antibiticos o medicamentos para tratar infecciones por hongos.   Baos de asiento.  Si la irritacin est originada en una cicatriz del prepucio que impide una  retraccin fcil, se recomienda una circuncisin.  INSTRUCCIONES PARA EL CUIDADO EN EL HOGAR   Debe evitar las relaciones sexuales hasta que la afeccin se haya mejorado.    ASEGRESE DE QUE:   Comprende estas instrucciones.   Controlar su afeccin.   Recibir ayuda de inmediato si no mejora o si empeora.    Esta informacin no tiene como fin reemplazar el consejo del mdico. Asegrese de hacerle al mdico cualquier pregunta que tenga.  Document Released: 02/20/2013 Document Revised: 06/25/2013 Document Reviewed: 12/10/2012  Elsevier Interactive Patient Education  2017 [employer] Inc[employer]

## 2018-03-15 NOTE — Progress Notes (Signed)
Subjective:  Patient ID: [name] [name], male    DOB: [birth year]  Age: 37 y.o. MRN: [id number]  CC: Hospitalization Follow-up   HPI [name] [name] is a 37 year old male with a history of hypertension who presents today with complaints of itching in his penis for the last 2 weeks.  He was seen at the ED and treated with nystatin which has provided minimal relief. He denies penile discharge, fever, urinary symptoms, history of multiple sexual partners.  Past Medical History:  Diagnosis Date  . Allergy   . Chronic kidney disease   . Hyperlipidemia   . Hypertension     Past Surgical History:  Procedure Laterality Date  . CYSTOSCOPY W/ RETROGRADES Left 07/08/2016   Procedure: CYSTOSCOPY WITH RETROGRADE PYELOGRAM AND STENT PLACEMENT;  Surgeon: Sebastian Ache, MD;  Location: [facility];  Service: Urology;  Laterality: Left;  . NO PAST SURGERIES    . ROBOT ASSISTED PYELOPLASTY Left 07/08/2016   Procedure: XI ROBOTIC ASSISTED PYELOPLASTY;  Surgeon: Sebastian Ache, MD;  Location: [facility];  Service: Urology;  Laterality: Left;    Allergies  Allergen Reactions  . Doxycycline Rash      Outpatient Medications Prior to Visit  Medication Sig Dispense Refill  . atorvastatin (LIPITOR) 10 MG tablet Take 1 tablet (10 mg total) by mouth daily. 30 tablet 6  . Vitamin D, Ergocalciferol, (DRISDOL) 50000 units CAPS capsule Take 1 capsule (50,000 Units total) by mouth every 7 (seven) days. 9 capsule 1  . lisinopril-hydrochlorothiazide (PRINZIDE,ZESTORETIC) 10-12.5 MG tablet Take 1 tablet by mouth daily. 30 tablet 6  . nystatin cream (MYCOSTATIN) Apply to affected area 2 times daily 15 g 0  . Omega-3 Fatty Acids (FISH OIL) 500 MG CAPS Take 1 each by mouth daily.    . Psyllium (METAMUCIL PO) Take by mouth as needed.     Facility-Administered Medications Prior to Visit  Medication Dose Route Frequency Provider Last Rate Last Dose  . 0.9 %  sodium chloride infusion  500 mL Intravenous Continuous Nandigam,  Kavitha V, MD        ROS Review of Systems  Constitutional: Negative for activity change and appetite change.  HENT: Negative for sinus pressure and sore throat.   Eyes: Negative for visual disturbance.  Respiratory: Negative for cough, chest tightness and shortness of breath.   Cardiovascular: Negative for chest pain and leg swelling.  Gastrointestinal: Negative for abdominal distention, abdominal pain, constipation and diarrhea.  Endocrine: Negative.   Genitourinary:       See hpi  Musculoskeletal: Negative for joint swelling and myalgias.  Skin: Negative for rash.  Allergic/Immunologic: Negative.   Neurological: Negative for weakness, light-headedness and numbness.  Psychiatric/Behavioral: Negative for dysphoric mood and suicidal ideas.    Objective:  BP 132/69   Pulse 61   Temp 97.7 F (36.5 C) (Oral)   Wt 213 lb 9.6 oz (96.9 kg)   SpO2 99%   BMI 32.72 kg/m   BP/Weight 03/15/2018 02/27/2018 11/24/2017  Systolic BP 132 139 124  Diastolic BP 69 77 80  Wt. (Lbs) 213.6 - 228  BMI 32.72 - 34.92     Physical Exam  Constitutional: He is oriented to person, place, and time. He appears well-developed and well-nourished.  Cardiovascular: Normal rate, normal heart sounds and intact distal pulses.  No murmur heard. Pulmonary/Chest: Effort normal and breath sounds normal. He has no wheezes. He has no rales. He exhibits no tenderness.  Abdominal: Soft. Bowel sounds are normal. He exhibits no distension and no mass.  There is no tenderness.  Genitourinary:  Genitourinary Comments: Uncircumcised penis. No evidence of lesions or penile discharge  Musculoskeletal: Normal range of motion.  Neurological: He is alert and oriented to person, place, and time.  Psychiatric: He has a normal mood and affect.     Assessment & Plan:   1. Balanitis Discussed penile hygiene - fluconazole (DIFLUCAN) 150 MG tablet; Take 1 tablet (150 mg total) by mouth once for 1 dose.  Dispense: 1 tablet;  Refill: 0 - clotrimazole (LOTRIMIN) 1 % cream; Apply 1 application topically 2 (two) times daily.  Dispense: 30 g; Refill: 0 - Urine cytology ancillary only  2. Essential hypertension Controlled Counseled on blood pressure goal of less than 130/80, low-sodium, DASH diet, medication compliance, 150 minutes of moderate intensity exercise per week. Discussed medication compliance, adverse effects. - lisinopril-hydrochlorothiazide (PRINZIDE,ZESTORETIC) 10-12.5 MG tablet; Take 1 tablet by mouth daily.  Dispense: 30 tablet; Refill: 6   Meds ordered this encounter  Medications  . fluconazole (DIFLUCAN) 150 MG tablet    Sig: Take 1 tablet (150 mg total) by mouth once for 1 dose.    Dispense:  1 tablet    Refill:  0  . clotrimazole (LOTRIMIN) 1 % cream    Sig: Apply 1 application topically 2 (two) times daily.    Dispense:  30 g    Refill:  0  . lisinopril-hydrochlorothiazide (PRINZIDE,ZESTORETIC) 10-12.5 MG tablet    Sig: Take 1 tablet by mouth daily.    Dispense:  30 tablet    Refill:  6    Follow-up: Return in about 6 months (around 09/13/2018) for follow up of Hypertension.   Hoy RegisterEnobong Trenden Hazelrigg MD

## 2018-03-16 LAB — URINE CYTOLOGY ANCILLARY ONLY
CHLAMYDIA, DNA PROBE: NEGATIVE
NEISSERIA GONORRHEA: NEGATIVE
TRICH (WINDOWPATH): NEGATIVE

## 2018-03-16 LAB — HIV ANTIBODY (ROUTINE TESTING W REFLEX): HIV Screen 4th Generation wRfx: NONREACTIVE

## 2018-03-16 LAB — RPR: RPR Ser Ql: NONREACTIVE

## 2018-03-19 ENCOUNTER — Telehealth: Payer: Self-pay

## 2018-03-19 NOTE — Telephone Encounter (Signed)
-----   Message from Hoy RegisterEnobong Newlin, MD sent at 03/19/2018 10:27 AM EDT ----- HIV,STD tests are negative

## 2018-03-19 NOTE — Telephone Encounter (Signed)
Patient was called via interpreter(Pablo [phone]) and there is no voicemail set up to leave a message.    If patient returns phone call please inform patient of lab results below.

## 2018-03-20 LAB — URINE CYTOLOGY ANCILLARY ONLY: CANDIDA VAGINITIS: NEGATIVE

## 2018-04-27 ENCOUNTER — Other Ambulatory Visit: Payer: Self-pay | Admitting: Family Medicine

## 2018-04-27 NOTE — Telephone Encounter (Signed)
Patient called because he would like a medication refill for Vitamin D and would like it sent to [facility] pharmacys.

## 2018-04-30 MED ORDER — VITAMIN D (ERGOCALCIFEROL) 1.25 MG (50000 UNIT) PO CAPS: 50000.0000 [IU] | ORAL_CAPSULE | ORAL | 0 refills | Status: DC

## 2018-05-04 ENCOUNTER — Other Ambulatory Visit: Payer: Self-pay

## 2018-05-04 MED ORDER — VITAMIN D (ERGOCALCIFEROL) 1.25 MG (50000 UNIT) PO CAPS: 50000.0000 [IU] | ORAL_CAPSULE | ORAL | 0 refills | Status: DC

## 2018-06-21 ENCOUNTER — Telehealth: Payer: Self-pay | Admitting: Family Medicine

## 2018-06-21 NOTE — Telephone Encounter (Signed)
1) Medication(s) Requested (by name): -Vitamin D, Ergocalciferol, (DRISDOL) 50000 units CAPS capsule   2) Pharmacy of Choice: -[facility] [place]) Special Requests:   Approved medications will be sent to the pharmacy, we will reach out if there is an issue.  Requests made after 3pm may not be addressed until the following business day!  If a patient is unsure of the name of the medication(s) please note and ask patient to call back when they are able to provide all info, do not send to responsible party until all information is available!

## 2018-06-22 NOTE — Telephone Encounter (Signed)
[name] [name], please let the patient know he can pick up Vitamin D3 over the counter at any pharmacy or grocery store.

## 2018-06-22 NOTE — Telephone Encounter (Signed)
Attempted to call patient several times and no answer, If patient calls back please refer to previous note

## 2018-06-22 NOTE — Telephone Encounter (Signed)
Advised to use OTC vitamin D capsule.  We will check levels at next office visit.

## 2018-07-16 ENCOUNTER — Telehealth: Payer: Self-pay | Admitting: Family Medicine

## 2018-07-16 DIAGNOSIS — E7800 Pure hypercholesterolemia, unspecified: Secondary | ICD-10-CM

## 2018-07-16 NOTE — Telephone Encounter (Signed)
1) Medication(s) Requested (by name): atorvastin  2) Pharmacy of Choice:  [facility]

## 2018-07-17 MED ORDER — ATORVASTATIN CALCIUM 10 MG PO TABS: 10.0000 mg | ORAL_TABLET | Freq: Every day | ORAL | 2 refills | Status: DC

## 2018-07-29 IMAGING — CT CT ABD-PELV W/O CM
2 of 4 series · 17 of 46 positions shown, 19 images · non-contrast
Comparison: None.

CLINICAL DATA: Left testicular pain and swelling for 5 months.

EXAM:
CT ABDOMEN AND PELVIS WITHOUT CONTRAST
TECHNIQUE: Multidetector CT imaging of the abdomen and pelvis was performed
following the standard protocol without IV contrast.

[Series 2: rtn a/p w/o · axial · non-contrast · 0.77mm/px · z∈[-602,-148]mm · 14 of 103 slices shown, 16 images]
[im 6/103  soft-tissue]
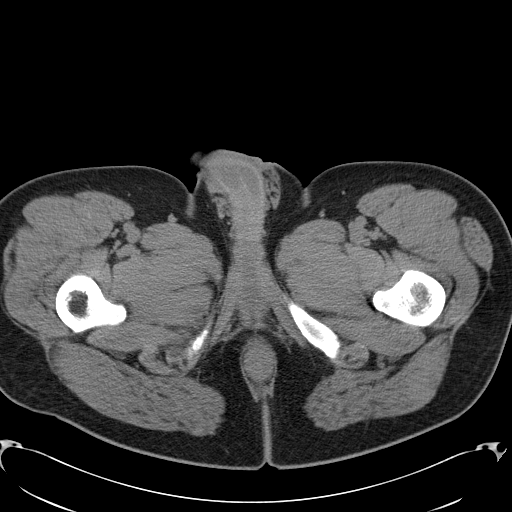
[im 6/103  bone]
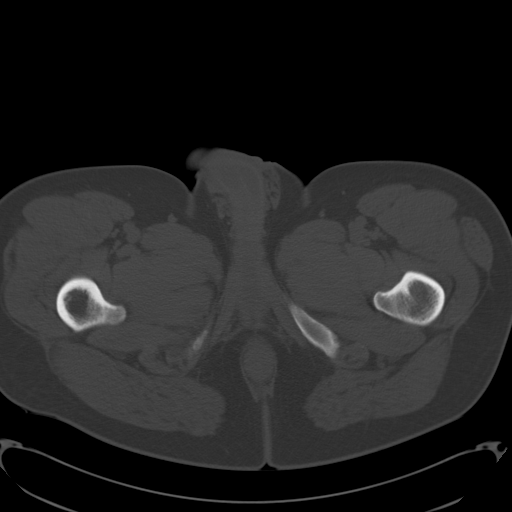
[im 16/103  soft-tissue]
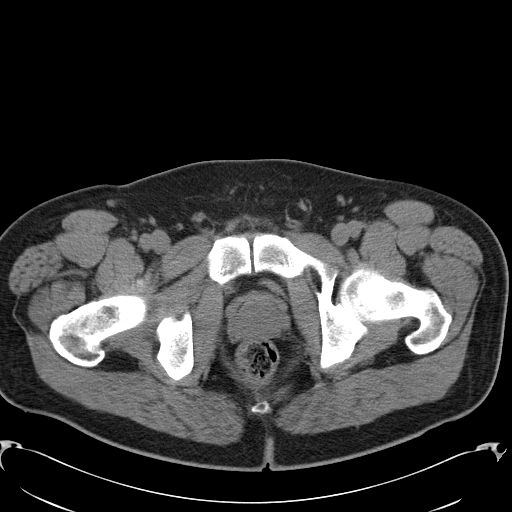
[im 21/103  soft-tissue]
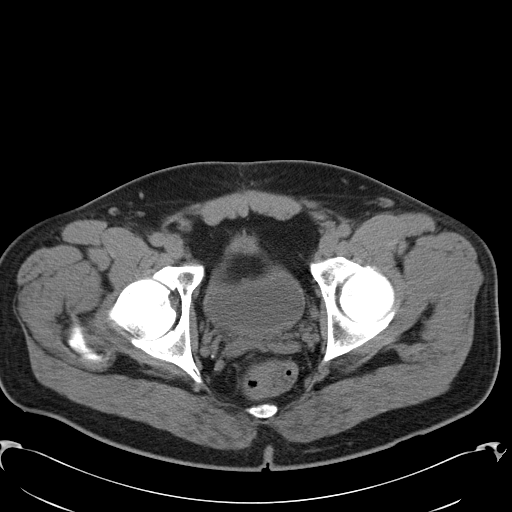
[im 26/103  soft-tissue]
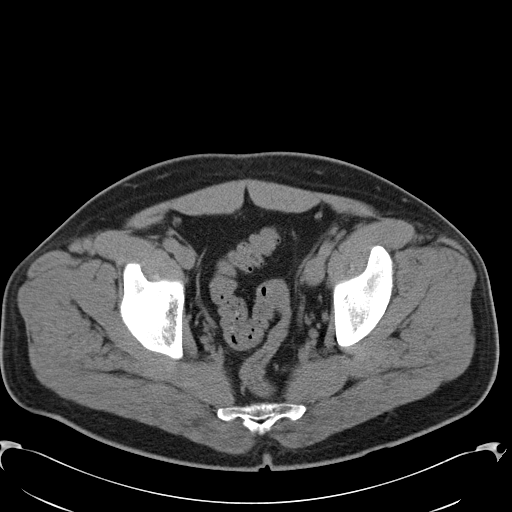
[im 36/103  soft-tissue]
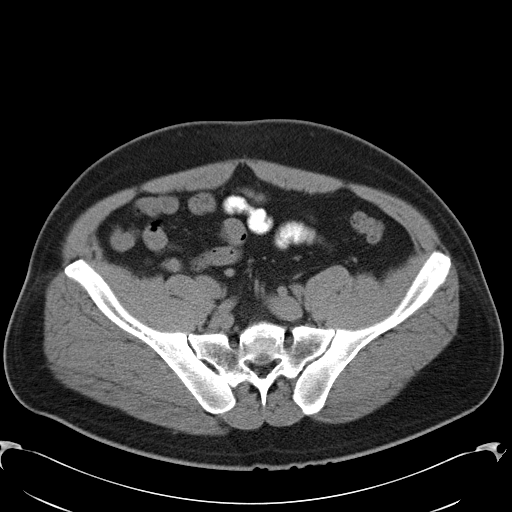
[im 41/103  soft-tissue]
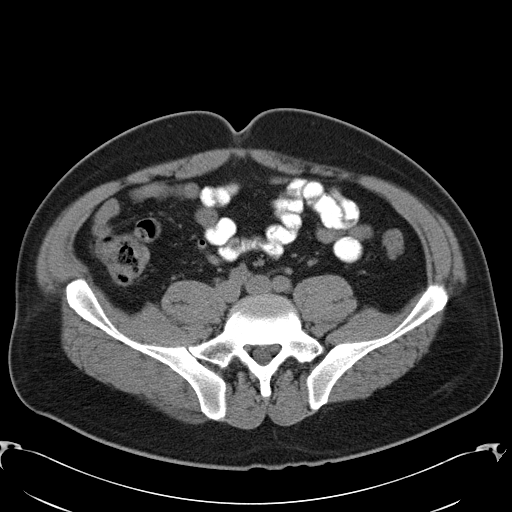
[im 46/103  soft-tissue]
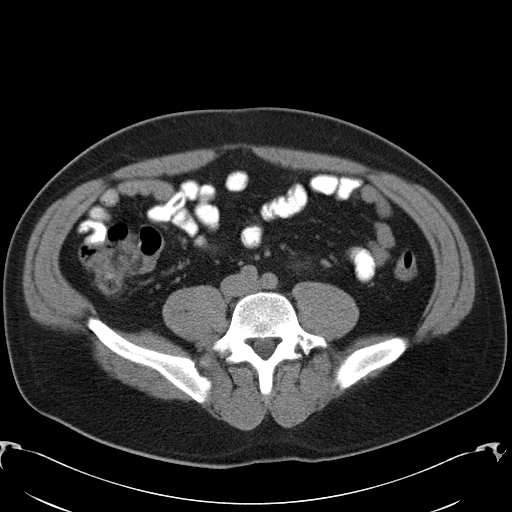
[im 57/103  soft-tissue]
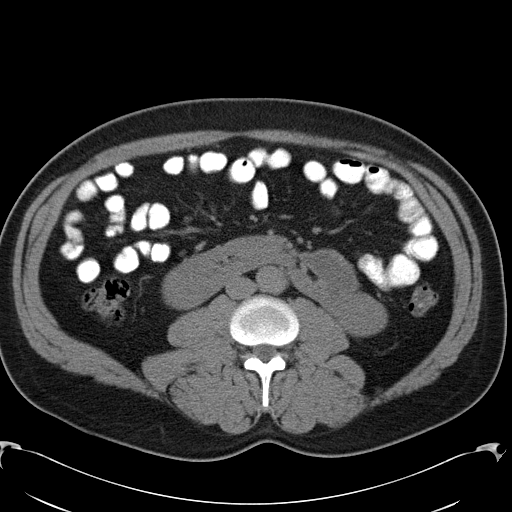
[im 62/103  soft-tissue]
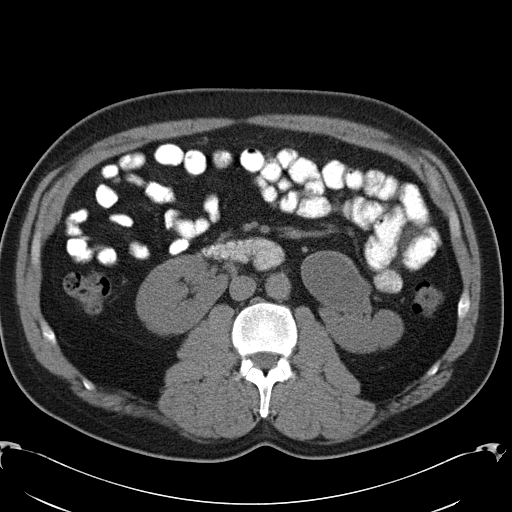
[im 62/103  bone]
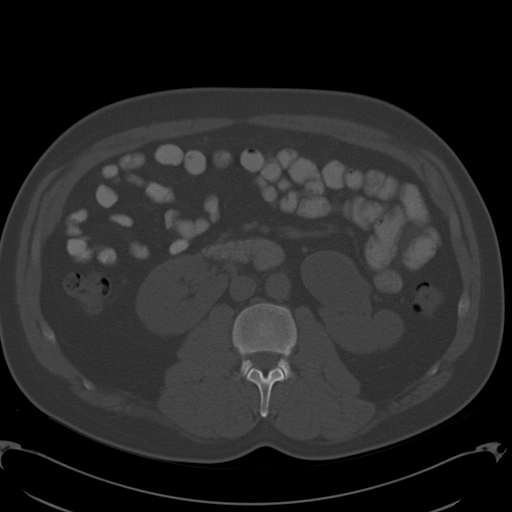
[im 67/103  soft-tissue]
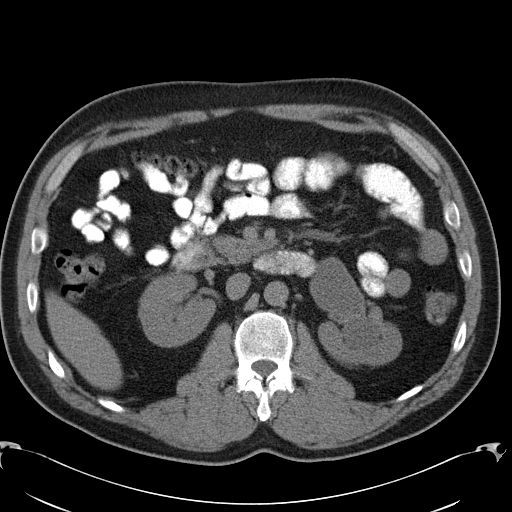
[im 77/103  soft-tissue]
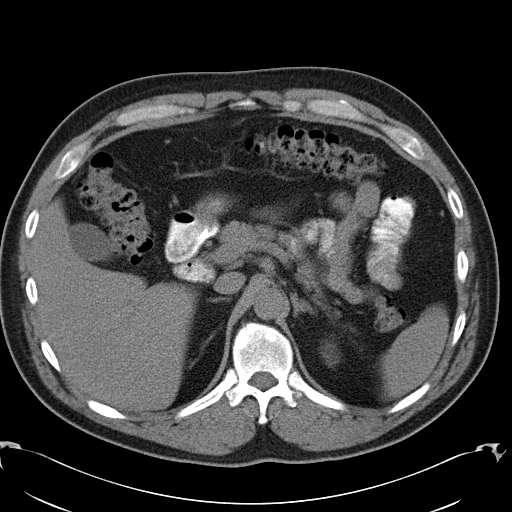
[im 82/103  soft-tissue]
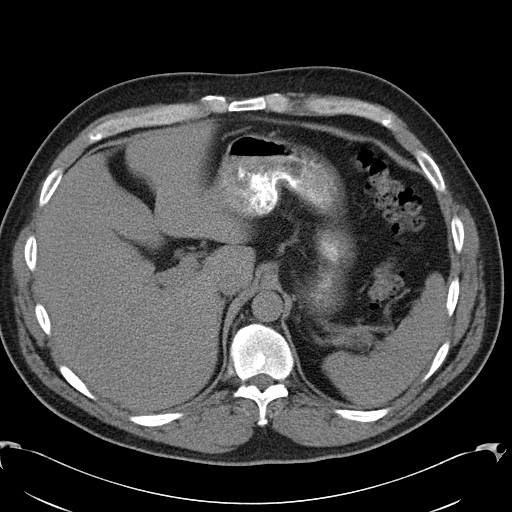
[im 87/103  soft-tissue]
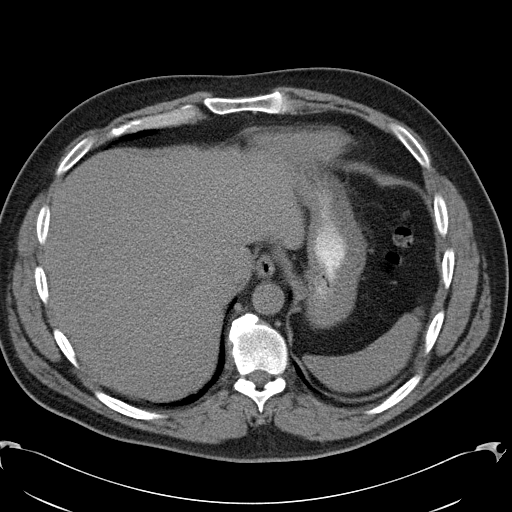
[im 97/103  soft-tissue]
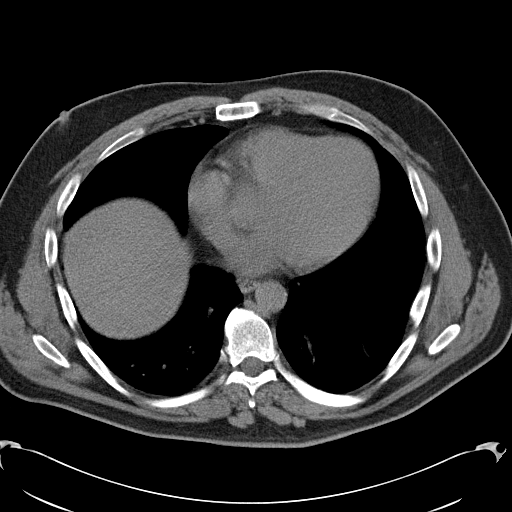

[Series 603: <mpr thick range(1)> · coronal · 1.00mm/px · 3 of 141 slices shown]
[im 47/141  soft-tissue]
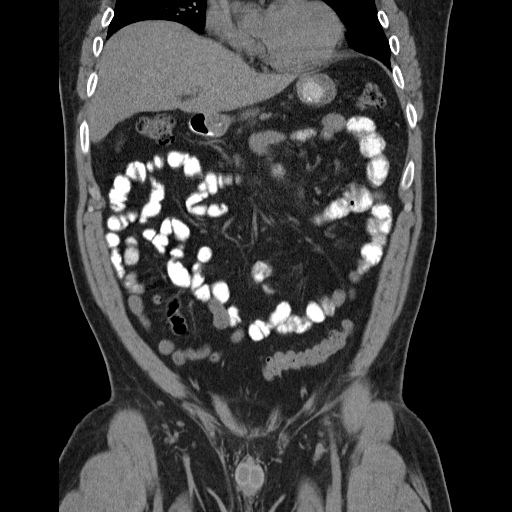
[im 63/141  soft-tissue]
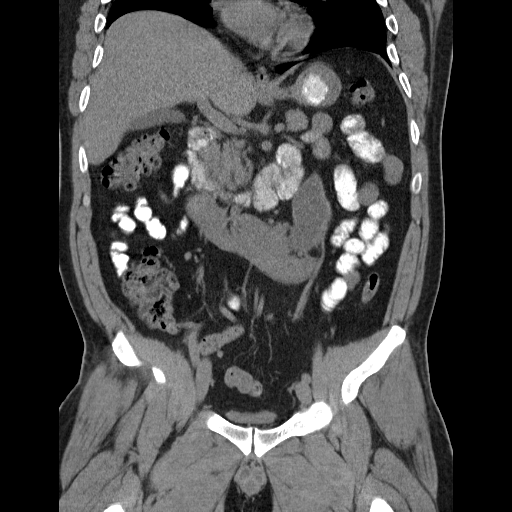
[im 78/141  soft-tissue]
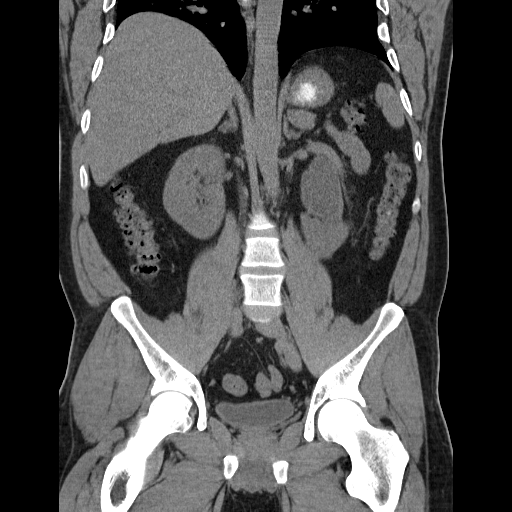

[17 of 46 positions shown; findings below may reference images not displayed]

FINDINGS: Lower chest: No acute findings.

Hepatobiliary: No mass visualized on this unenhanced exam.
Gallbladder is unremarkable.

Pancreas: No mass or inflammatory process visualized on this
unenhanced exam.

Spleen:  Within normal limits in size.

Adrenals/Urinary tract: Normal adrenal glands. Horseshoe kidney
noted. Moderate to severe left-sided hydronephrosis is seen without
evidence of ureteral calculi or dilatation. Unopacified urinary
bladder is unremarkable in appearance.

Stomach/Bowel: No evidence of obstruction, inflammatory process, or
abnormal fluid collections. Normal appendix visualized.

Vascular/Lymphatic: No pathologically enlarged lymph nodes
identified. No evidence of abdominal aortic aneurysm.

Reproductive:  No mass or other significant abnormality.

Other: No hernia or mass identified within the inguinal or groin
regions.

Musculoskeletal:  No suspicious bone lesions identified.
IMPRESSION: Horseshoe kidney with moderate to severe left-sided hydronephrosis.
No ureteral calculi or other obstructing etiology visualized on this
unenhanced exam. Congenital UPJ obstruction cannot be excluded.
Consider abdomen pelvis CT urogram with contrast or ureteroscopy for
further evaluation.

## 2018-08-27 ENCOUNTER — Telehealth: Payer: Self-pay | Admitting: Family Medicine

## 2018-08-27 NOTE — Telephone Encounter (Signed)
1) Medication(s) Requested (by name): Atorvastatin Lisinopril-hydro 2) Pharmacy of Choice:  [facility]  *patient states he does not have anymore pills

## 2018-08-28 NOTE — Telephone Encounter (Signed)
Pt has refills at pharmacy, he should have 10 days left of the atorvastatin so it is too early to be filled, the lisinopril-hctz should be ready for pickup later on this morning. Please notify the patient.

## 2018-09-07 ENCOUNTER — Ambulatory Visit: Payer: Self-pay | Attending: Family Medicine

## 2018-09-24 ENCOUNTER — Ambulatory Visit: Payer: Self-pay | Admitting: Family Medicine

## 2018-10-02 ENCOUNTER — Telehealth: Payer: Self-pay | Admitting: Family Medicine

## 2018-10-02 NOTE — Telephone Encounter (Signed)
Pt verified their address for medication delivery  [address] [place], [place] [address]

## 2018-10-03 NOTE — Telephone Encounter (Signed)
Pharmacy notified.

## 2018-11-12 ENCOUNTER — Encounter: Payer: Self-pay | Admitting: Family Medicine

## 2018-11-12 ENCOUNTER — Ambulatory Visit: Payer: Self-pay | Attending: Family Medicine | Admitting: Family Medicine

## 2018-11-12 VITALS — BP 123/70 | HR 75 | Temp 97.7°F | Wt 207.6 lb

## 2018-11-12 DIAGNOSIS — E559 Vitamin D deficiency, unspecified: Secondary | ICD-10-CM

## 2018-11-12 DIAGNOSIS — L608 Other nail disorders: Secondary | ICD-10-CM

## 2018-11-12 DIAGNOSIS — E7800 Pure hypercholesterolemia, unspecified: Secondary | ICD-10-CM

## 2018-11-12 DIAGNOSIS — I10 Essential (primary) hypertension: Secondary | ICD-10-CM

## 2018-11-12 MED ORDER — ATORVASTATIN CALCIUM 10 MG PO TABS: 10.0000 mg | ORAL_TABLET | Freq: Every day | ORAL | 1 refills | Status: DC

## 2018-11-12 MED ORDER — LISINOPRIL-HYDROCHLOROTHIAZIDE 10-12.5 MG PO TABS: 1.0000 | ORAL_TABLET | Freq: Every day | ORAL | 1 refills | Status: DC

## 2018-11-12 NOTE — Progress Notes (Signed)
Subjective:  Patient ID: [name] [name], male    DOB: April 21, 1981  Age: 38 y.o. MRN: [id number]  CC: Hypertension   HPI [name] is a 38 year old male with a history of hyperlipidemia, hypertension who presents today for follow-up visit. He has been compliant with his antihypertensive and statin and denies adverse effects from his medications. He is concerned about whitish discoloration of his nails which he has had for almost a year.  This is present all over his fingernails and he denies the presence of nail pain or nail separation.  His hands are not infrequent contact with chemicals He was treated for vitamin D deficiency and has completed a course of Drisdol. He denies chest pain, pedal edema and has no additional concerns today.  Past Medical History:  Diagnosis Date  . Allergy   . Chronic kidney disease   . Hyperlipidemia   . Hypertension     Past Surgical History:  Procedure Laterality Date  . CYSTOSCOPY W/ RETROGRADES Left 07/08/2016   Procedure: CYSTOSCOPY WITH RETROGRADE PYELOGRAM AND STENT PLACEMENT;  Surgeon: Alexis Frock, MD;  Location: [facility];  Service: Urology;  Laterality: Left;  . NO PAST SURGERIES    . ROBOT ASSISTED PYELOPLASTY Left 07/08/2016   Procedure: XI ROBOTIC ASSISTED PYELOPLASTY;  Surgeon: Alexis Frock, MD;  Location: [facility];  Service: Urology;  Laterality: Left;    Family History  Problem Relation Age of Onset  . Hypertension Father   . Leukemia Daughter        age 77 yrs old   . Colon cancer Neg Hx   . Stomach cancer Neg Hx   . Esophageal cancer Neg Hx   . Rectal cancer Neg Hx     Allergies  Allergen Reactions  . Doxycycline Rash    Outpatient Medications Prior to Visit  Medication Sig Dispense Refill  . clotrimazole (LOTRIMIN) 1 % cream Apply 1 application topically 2 (two) times daily. 30 g 0  . Omega-3 Fatty Acids (FISH OIL) 500 MG CAPS Take 1 each by mouth daily.    . Psyllium (METAMUCIL PO) Take by mouth as needed.     . Vitamin D, Ergocalciferol, (DRISDOL) 50000 units CAPS capsule Take 1 capsule (50,000 Units total) by mouth every 7 (seven) days. 4 capsule 0  . atorvastatin (LIPITOR) 10 MG tablet Take 1 tablet (10 mg total) by mouth daily. 30 tablet 2  . lisinopril-hydrochlorothiazide (PRINZIDE,ZESTORETIC) 10-12.5 MG tablet Take 1 tablet by mouth daily. 30 tablet 6   Facility-Administered Medications Prior to Visit  Medication Dose Route Frequency Provider Last Rate Last Dose  . 0.9 %  sodium chloride infusion  500 mL Intravenous Continuous Nandigam, Kavitha V, MD         ROS Review of Systems  Constitutional: Negative for activity change and appetite change.  HENT: Negative for sinus pressure and sore throat.   Eyes: Negative for visual disturbance.  Respiratory: Negative for cough, chest tightness and shortness of breath.   Cardiovascular: Negative for chest pain and leg swelling.  Gastrointestinal: Negative for abdominal distention, abdominal pain, constipation and diarrhea.  Endocrine: Negative.   Genitourinary: Negative for dysuria.  Musculoskeletal: Negative for joint swelling and myalgias.  Skin: Negative for rash.  Allergic/Immunologic: Negative.   Neurological: Negative for weakness, light-headedness and numbness.  Psychiatric/Behavioral: Negative for dysphoric mood and suicidal ideas.    Objective:  BP 123/70   Pulse 75   Temp 97.7 F (36.5 C) (Oral)   Wt 207 lb 9.6 oz (  94.2 kg)   SpO2 99%   BMI 31.80 kg/m   BP/Weight 11/12/2018 03/15/2018 0/94/0768  Systolic BP 088 110 315  Diastolic BP 70 69 77  Wt. (Lbs) 207.6 213.6 -  BMI 31.8 32.72 -      Physical Exam Constitutional:      Appearance: He is well-developed.  Cardiovascular:     Rate and Rhythm: Normal rate.     Heart sounds: Normal heart sounds. No murmur.  Pulmonary:     Effort: Pulmonary effort is normal.     Breath sounds: Normal breath sounds. No wheezing or rales.  Chest:     Chest wall: No tenderness.   Abdominal:     General: Bowel sounds are normal. There is no distension.     Palpations: Abdomen is soft. There is no mass.     Tenderness: There is no abdominal tenderness.  Musculoskeletal: Normal range of motion.  Skin:    Comments: Partial whitish discoloration of fingernails x10 nondisplaced with pinkish color. Not tender to palpation  Neurological:     Mental Status: He is alert and oriented to person, place, and time.     CMP Latest Ref Rng & Units 11/29/2017 01/02/2017 10/21/2016  Glucose 65 - 99 mg/dL 90 90 101(H)  BUN 6 - 20 mg/dL _0 Creatinine 0.76 - 1.27 mg/dL 0.82 0.86 0.80  Sodium 134 - 144 mmol/L 136 139 137  Potassium 3.5 - 5.2 mmol/L 4.2 4.4 3.7  Chloride 96 - 106 mmol/L 107(H) 98 103  CO2 20 - 29 mmol/L 22 22 -  Calcium 8.7 - 10.2 mg/dL 9.6 9.3 -  Total Protein 6.0 - 8.5 g/dL 7.3 - -  Total Bilirubin 0.0 - 1.2 mg/dL 0.5 - -  Alkaline Phos 39 - 117 IU/L 88 - -  AST 0 - 40 IU/L 20 - -  ALT 0 - 44 IU/L 28 - -    Lipid Panel     Component Value Date/Time   CHOL 181 11/29/2017 0901   TRIG 224 (H) 11/29/2017 0901   HDL 51 11/29/2017 0901   CHOLHDL 3.5 11/29/2017 0901   CHOLHDL 4.3 03/28/2016 1229   VLDL 66 (H) 03/28/2016 1229   LDLCALC 85 11/29/2017 0901    CBC    Component Value Date/Time   WBC 7.5 09/27/2017 1723   WBC 14.6 (H) 07/23/2016 0522   RBC 5.40 09/27/2017 1723   RBC 3.21 (L) 07/23/2016 0522   HGB 15.2 09/27/2017 1723   HCT 43.5 09/27/2017 1723   PLT 246 09/27/2017 1723   MCV 81 09/27/2017 1723   MCH 28.1 09/27/2017 1723   MCH 28.7 07/23/2016 0522   MCHC 34.9 09/27/2017 1723   MCHC 32.6 07/23/2016 0522   RDW 15.6 (H) 09/27/2017 1723   LYMPHSABS 2.2 09/27/2017 1723   MONOABS 0.9 07/23/2016 0522   EOSABS 0.2 09/27/2017 1723   BASOSABS 0.0 09/27/2017 1723    Lab Results  Component Value Date   HGBA1C 5.7 06/23/2017    Assessment & Plan:   1. Essential hypertension Controlled Counseled on blood pressure goal of less than  130/80, low-sodium, DASH diet, medication compliance, 150 minutes of moderate intensity exercise per week. Discussed medication compliance, adverse effects. - lisinopril-hydrochlorothiazide (ZESTORETIC) 10-12.5 MG tablet; Take 1 tablet by mouth daily.  Dispense: 90 tablet; Refill: 1 - CMP14+EGFR; Future - Lipid panel; Future - VITAMIN D 25 Hydroxy (Vit-D Deficiency, Fractures); Future  2. Pure hypercholesterolemia Controlled Low-cholesterol diet - atorvastatin (LIPITOR) 10 MG tablet;  Take 1 tablet (10 mg total) by mouth daily.  Dispense: 90 tablet; Refill: 1  3. Leukonychia Suspicious for nasal psoriasis Will refer to dermatology - Ambulatory referral to Dermatology   Meds ordered this encounter  Medications  . lisinopril-hydrochlorothiazide (ZESTORETIC) 10-12.5 MG tablet    Sig: Take 1 tablet by mouth daily.    Dispense:  90 tablet    Refill:  1  . atorvastatin (LIPITOR) 10 MG tablet    Sig: Take 1 tablet (10 mg total) by mouth daily.    Dispense:  90 tablet    Refill:  1    Follow-up: Return in about 6 months (around 05/15/2019) for follow up of chronic medical conditions.       Charlott Rakes, MD, FAAFP. [facility] [place], [place]   11/12/2018, 4:47 PM

## 2018-11-21 ENCOUNTER — Other Ambulatory Visit: Payer: Self-pay

## 2018-11-21 ENCOUNTER — Ambulatory Visit: Payer: Self-pay | Attending: Family Medicine

## 2018-11-21 DIAGNOSIS — I10 Essential (primary) hypertension: Secondary | ICD-10-CM

## 2018-11-22 ENCOUNTER — Other Ambulatory Visit: Payer: Self-pay | Admitting: Internal Medicine

## 2018-11-22 ENCOUNTER — Telehealth: Payer: Self-pay | Admitting: Family Medicine

## 2018-11-22 LAB — CMP14+EGFR
ALT: 29 IU/L (ref 0–44)
AST: 29 IU/L (ref 0–40)
Albumin/Globulin Ratio: 1.7 (ref 1.2–2.2)
Albumin: 4.8 g/dL (ref 4.0–5.0)
Alkaline Phosphatase: 83 IU/L (ref 39–117)
BUN/Creatinine Ratio: 17 (ref 9–20)
BUN: 15 mg/dL (ref 6–20)
Bilirubin Total: 0.8 mg/dL (ref 0.0–1.2)
CO2: 23 mmol/L (ref 20–29)
Calcium: 10.1 mg/dL (ref 8.7–10.2)
Chloride: 100 mmol/L (ref 96–106)
Creatinine, Ser: 0.86 mg/dL (ref 0.76–1.27)
GFR calc Af Amer: 129 mL/min/{1.73_m2} (ref >59)
GFR calc non Af Amer: 112 mL/min/{1.73_m2} (ref >59)
Globulin, Total: 2.9 g/dL (ref 1.5–4.5)
Glucose: 98 mg/dL (ref 65–99)
Potassium: 4.6 mmol/L (ref 3.5–5.2)
Sodium: 137 mmol/L (ref 134–144)
Total Protein: 7.7 g/dL (ref 6.0–8.5)

## 2018-11-22 LAB — LIPID PANEL
Chol/HDL Ratio: 3 ratio (ref 0.0–5.0)
Cholesterol, Total: 210 mg/dL — ABNORMAL HIGH (ref 100–199)
HDL: 69 mg/dL (ref >39)
LDL Calculated: 115 mg/dL — ABNORMAL HIGH (ref 0–99)
Triglycerides: 131 mg/dL (ref 0–149)
VLDL Cholesterol Cal: 26 mg/dL (ref 5–40)

## 2018-11-22 LAB — VITAMIN D 25 HYDROXY (VIT D DEFICIENCY, FRACTURES): Vit D, 25-Hydroxy: 28 ng/mL — ABNORMAL LOW (ref 30.0–100.0)

## 2018-11-22 MED ORDER — VITAMIN D (ERGOCALCIFEROL) 1.25 MG (50000 UNIT) PO CAPS: 50000.0000 [IU] | ORAL_CAPSULE | ORAL | 1 refills | Status: DC

## 2018-11-22 NOTE — Telephone Encounter (Signed)
Patient called stating that he received a missed call and is not sure if it was in regards to his results. Please follow up.

## 2018-11-30 ENCOUNTER — Telehealth: Payer: Self-pay

## 2018-11-30 NOTE — Telephone Encounter (Signed)
-----   Message from Marcine Matar, MD sent at 11/22/2018  8:05 AM EDT ----- Let patient know that his vitamin D level is still low.  Prescription to his pharmacy for refill on the high-dose vitamin D which she will take once a week.  His LDL cholesterol is elevated at 115 which is higher than 1 year ago.  Please confirm that he is taking the atorvastatin 10 mg every day as prescribed.  If he has been taking it consistently then we will need to increase the dose to 20 mg daily.  Please let me know so that I can send a new prescription.  Kidney and liver function tests are normal.

## 2018-11-30 NOTE — Telephone Encounter (Signed)
Patient was called and a voicemail was left informing patient to return phone call for lab results. 

## 2018-12-03 NOTE — Telephone Encounter (Signed)
Attempted to reach patient for results. No answer and left a VM.

## 2018-12-04 ENCOUNTER — Other Ambulatory Visit: Payer: Self-pay

## 2018-12-04 MED ORDER — ATORVASTATIN CALCIUM 20 MG PO TABS: 20.0000 mg | ORAL_TABLET | Freq: Every day | ORAL | 1 refills | Status: DC

## 2019-02-05 ENCOUNTER — Other Ambulatory Visit: Payer: Self-pay | Admitting: Family Medicine

## 2019-02-05 NOTE — Telephone Encounter (Signed)
1) Medication(s) Requested (by name): Vitamin d atorvastatin 2) Pharmacy of Choice: [facility] 3) Special Requests:   Approved medications will be sent to the pharmacy, we will reach out if there is an issue.  Requests made after 3pm may not be addressed until the following business day!  If a patient is unsure of the name of the medication(s) please note and ask patient to call back when they are able to provide all info, do not send to responsible party until all information is available!

## 2019-02-06 MED ORDER — VITAMIN D (ERGOCALCIFEROL) 1.25 MG (50000 UNIT) PO CAPS: 50000.0000 [IU] | ORAL_CAPSULE | ORAL | 0 refills | Status: DC

## 2019-02-18 ENCOUNTER — Telehealth: Payer: Self-pay | Admitting: Family Medicine

## 2019-02-18 NOTE — Telephone Encounter (Signed)
1) Medication(s) Requested (by name): Cholesterol  2) Pharmacy of Choice: [place], [place] [address]

## 2019-02-19 MED ORDER — ATORVASTATIN CALCIUM 20 MG PO TABS: 20.0000 mg | ORAL_TABLET | Freq: Every day | ORAL | 3 refills | Status: DC

## 2019-02-19 NOTE — Telephone Encounter (Signed)
Does patient need a lab draw before refilling this medication.

## 2019-02-19 NOTE — Telephone Encounter (Signed)
No he does not need repeat labs.  Refill sent to pharmacy

## 2019-02-20 NOTE — Telephone Encounter (Signed)
Patient was called and informed of medication being refilled and sent to pharmacy. 

## 2019-03-26 ENCOUNTER — Other Ambulatory Visit: Payer: Self-pay | Admitting: Pharmacist

## 2019-03-26 ENCOUNTER — Other Ambulatory Visit: Payer: Self-pay | Admitting: Internal Medicine

## 2019-03-26 MED ORDER — VITAMIN D (ERGOCALCIFEROL) 1.25 MG (50000 UNIT) PO CAPS: 50000.0000 [IU] | ORAL_CAPSULE | ORAL | 0 refills | Status: DC

## 2019-05-01 ENCOUNTER — Other Ambulatory Visit: Payer: Self-pay

## 2019-05-01 ENCOUNTER — Other Ambulatory Visit: Payer: Self-pay | Admitting: Family Medicine

## 2019-05-01 DIAGNOSIS — Z20822 Contact with and (suspected) exposure to covid-19: Secondary | ICD-10-CM

## 2019-05-01 NOTE — Telephone Encounter (Signed)
1) Medication(s) Requested (by name): Vitamin d 2) Pharmacy of Choice: [facility] 3) Special Requests:   Approved medications will be sent to the pharmacy, we will reach out if there is an issue.  Requests made after 3pm may not be addressed until the following business day!  If a patient is unsure of the name of the medication(s) please note and ask patient to call back when they are able to provide all info, do not send to responsible party until all information is available!

## 2019-05-01 NOTE — Telephone Encounter (Signed)
Refills sent

## 2019-05-02 LAB — NOVEL CORONAVIRUS, NAA: SARS-CoV-2, NAA: NOT DETECTED

## 2019-05-15 ENCOUNTER — Ambulatory Visit: Payer: Self-pay | Attending: Family Medicine | Admitting: Family Medicine

## 2019-05-15 ENCOUNTER — Encounter: Payer: Self-pay | Admitting: Family Medicine

## 2019-05-15 ENCOUNTER — Other Ambulatory Visit: Payer: Self-pay

## 2019-05-15 VITALS — BP 107/63 | HR 81 | Temp 98.0°F | Ht 68.0 in | Wt 227.0 lb

## 2019-05-15 DIAGNOSIS — I10 Essential (primary) hypertension: Secondary | ICD-10-CM

## 2019-05-15 DIAGNOSIS — E7800 Pure hypercholesterolemia, unspecified: Secondary | ICD-10-CM

## 2019-05-15 DIAGNOSIS — E559 Vitamin D deficiency, unspecified: Secondary | ICD-10-CM

## 2019-05-15 DIAGNOSIS — L608 Other nail disorders: Secondary | ICD-10-CM

## 2019-05-15 MED ORDER — ATORVASTATIN CALCIUM 20 MG PO TABS: 20.0000 mg | ORAL_TABLET | Freq: Every day | ORAL | 6 refills | Status: DC

## 2019-05-15 MED ORDER — LISINOPRIL-HYDROCHLOROTHIAZIDE 10-12.5 MG PO TABS: 1.0000 | ORAL_TABLET | Freq: Every day | ORAL | 6 refills | Status: DC

## 2019-05-15 NOTE — Progress Notes (Signed)
Subjective:  Patient ID: [name] [name], male    DOB: Mar 15, 1981  Age: 38 y.o. MRN: [id number]  CC: Hypertension   HPI [name] [name] is a 38 year old male with a history of hyperlipidemia, vitamin D deficiency, hypertension who presents today for follow-up visit. He is compliant with his antihypertensive and statin and denies adverse effects from his medications. Currently on Drisdol for vitamin D deficiency. He has a whitish discoloration of his fingernails which are present in his right index finger, left index and middle finger.  He had complained of this to his last visit 6 months ago one of the time all 10 of his fingers went partially discolored with the lower half whitish in color and other half normal. I have referred him to dermatology to exclude Psoriasis but somehow this never went through.  Past Medical History:  Diagnosis Date  . Allergy   . Chronic kidney disease   . Hyperlipidemia   . Hypertension     Past Surgical History:  Procedure Laterality Date  . CYSTOSCOPY W/ RETROGRADES Left 07/08/2016   Procedure: CYSTOSCOPY WITH RETROGRADE PYELOGRAM AND STENT PLACEMENT;  Surgeon: Sebastian Ache, MD;  Location: [facility];  Service: Urology;  Laterality: Left;  . NO PAST SURGERIES    . ROBOT ASSISTED PYELOPLASTY Left 07/08/2016   Procedure: XI ROBOTIC ASSISTED PYELOPLASTY;  Surgeon: Sebastian Ache, MD;  Location: [facility];  Service: Urology;  Laterality: Left;    Family History  Problem Relation Age of Onset  . Hypertension Father   . Leukemia Daughter        age 14 yrs old   . Colon cancer Neg Hx   . Stomach cancer Neg Hx   . Esophageal cancer Neg Hx   . Rectal cancer Neg Hx     Allergies  Allergen Reactions  . Doxycycline Rash    Outpatient Medications Prior to Visit  Medication Sig Dispense Refill  . clotrimazole (LOTRIMIN) 1 % cream Apply 1 application topically 2 (two) times daily. 30 g 0  . Omega-3 Fatty Acids (FISH OIL) 500 MG CAPS Take 1 each by mouth  daily.    . Psyllium (METAMUCIL PO) Take by mouth as needed.    . Vitamin D, Ergocalciferol, (DRISDOL) 1.25 MG (50000 UT) CAPS capsule TAKE 1 CAPSULE (50,000 UNITS TOTAL) BY MOUTH EVERY 7 (SEVEN) DAYS. MUST HAVE OFFICE VISIT FOR REFILLS 4 capsule 0  . atorvastatin (LIPITOR) 20 MG tablet Take 1 tablet (20 mg total) by mouth daily. 30 tablet 3  . lisinopril-hydrochlorothiazide (ZESTORETIC) 10-12.5 MG tablet Take 1 tablet by mouth daily. 90 tablet 1   Facility-Administered Medications Prior to Visit  Medication Dose Route Frequency Provider Last Rate Last Dose  . 0.9 %  sodium chloride infusion  500 mL Intravenous Continuous Nandigam, Kavitha V, MD         ROS Review of Systems  Constitutional: Negative for activity change and appetite change.  HENT: Negative for sinus pressure and sore throat.   Eyes: Negative for visual disturbance.  Respiratory: Negative for cough, chest tightness and shortness of breath.   Cardiovascular: Negative for chest pain and leg swelling.  Gastrointestinal: Negative for abdominal distention, abdominal pain, constipation and diarrhea.  Endocrine: Negative.   Genitourinary: Negative for dysuria.  Musculoskeletal: Negative for joint swelling and myalgias.  Skin: Negative for rash.  Allergic/Immunologic: Negative.   Neurological: Negative for weakness, light-headedness and numbness.  Psychiatric/Behavioral: Negative for dysphoric mood and suicidal ideas.    Objective:  BP 107/63  Pulse 81   Temp 98 F (36.7 C) (Oral)   Ht 5\' 8"  (1.727 m)   Wt 227 lb (103 kg)   SpO2 96%   BMI 34.52 kg/m   BP/Weight 05/15/2019 11/12/2018 3/76/2831  Systolic BP 517 616 073  Diastolic BP 63 70 69  Wt. (Lbs) 227 207.6 213.6  BMI 34.52 31.8 32.72      Physical Exam Constitutional:      Appearance: He is well-developed.  Neck:     Vascular: No JVD.  Cardiovascular:     Rate and Rhythm: Normal rate.     Heart sounds: Normal heart sounds. No murmur.  Pulmonary:      Effort: Pulmonary effort is normal.     Breath sounds: Normal breath sounds. No wheezing or rales.  Chest:     Chest wall: No tenderness.  Abdominal:     General: Bowel sounds are normal. There is no distension.     Palpations: Abdomen is soft. There is no mass.     Tenderness: There is no abdominal tenderness.  Musculoskeletal: Normal range of motion.     Right lower leg: No edema.     Left lower leg: No edema.  Neurological:     Mental Status: He is alert and oriented to person, place, and time.  Psychiatric:        Mood and Affect: Mood normal.       CMP Latest Ref Rng & Units 11/21/2018 11/29/2017 01/02/2017  Glucose 65 - 99 mg/dL 98 90 90  BUN 6 - 20 mg/dL 15 11 13   Creatinine 0.76 - 1.27 mg/dL 0.86 0.82 0.86  Sodium 134 - 144 mmol/L 137 136 139  Potassium 3.5 - 5.2 mmol/L 4.6 4.2 4.4  Chloride 96 - 106 mmol/L 100 107(H) 98  CO2 20 - 29 mmol/L 23 22 22   Calcium 8.7 - 10.2 mg/dL 10.1 9.6 9.3  Total Protein 6.0 - 8.5 g/dL 7.7 7.3 -  Total Bilirubin 0.0 - 1.2 mg/dL 0.8 0.5 -  Alkaline Phos 39 - 117 IU/L 83 88 -  AST 0 - 40 IU/L 29 20 -  ALT 0 - 44 IU/L 29 28 -    Lipid Panel     Component Value Date/Time   CHOL 210 (H) 11/21/2018 0917   TRIG 131 11/21/2018 0917   HDL 69 11/21/2018 0917   CHOLHDL 3.0 11/21/2018 0917   CHOLHDL 4.3 03/28/2016 1229   VLDL 66 (H) 03/28/2016 1229   LDLCALC 115 (H) 11/21/2018 0917    CBC    Component Value Date/Time   WBC 7.5 09/27/2017 1723   WBC 14.6 (H) 07/23/2016 0522   RBC 5.40 09/27/2017 1723   RBC 3.21 (L) 07/23/2016 0522   HGB 15.2 09/27/2017 1723   HCT 43.5 09/27/2017 1723   PLT 246 09/27/2017 1723   MCV 81 09/27/2017 1723   MCH 28.1 09/27/2017 1723   MCH 28.7 07/23/2016 0522   MCHC 34.9 09/27/2017 1723   MCHC 32.6 07/23/2016 0522   RDW 15.6 (H) 09/27/2017 1723   LYMPHSABS 2.2 09/27/2017 1723   MONOABS 0.9 07/23/2016 0522   EOSABS 0.2 09/27/2017 1723   BASOSABS 0.0 09/27/2017 1723    Lab Results  Component  Value Date   HGBA1C 5.7 06/23/2017    Assessment & Plan:   1. Essential hypertension Controlled Continue current management Counseled on blood pressure goal of less than 130/80, low-sodium, DASH diet, medication compliance, 150 minutes of moderate intensity exercise per week. Discussed medication compliance,  adverse effects. - lisinopril-hydrochlorothiazide (ZESTORETIC) 10-12.5 MG tablet; Take 1 tablet by mouth daily.  Dispense: 30 tablet; Refill: 6 - Basic Metabolic Panel  2. Vitamin D deficiency Previous history of deficiency - VITAMIN D 25 Hydroxy (Vit-D Deficiency, Fractures)  3. Leukonychia Unknown etiology I have referred him to dermatology referral for months ago to exclude psoriasis but somehow this fell through.  I will refer again; lack of medical coverage could be a barrier. - Ambulatory referral to Dermatology  4. Pure hypercholesterolemia Stable Low-cholesterol diet - atorvastatin (LIPITOR) 20 MG tablet; Take 1 tablet (20 mg total) by mouth daily.  Dispense: 30 tablet; Refill: 6     Meds ordered this encounter  Medications  . lisinopril-hydrochlorothiazide (ZESTORETIC) 10-12.5 MG tablet    Sig: Take 1 tablet by mouth daily.    Dispense:  30 tablet    Refill:  6  . atorvastatin (LIPITOR) 20 MG tablet    Sig: Take 1 tablet (20 mg total) by mouth daily.    Dispense:  30 tablet    Refill:  6    Follow-up: Return in about 6 months (around 11/12/2019) for chronic medical conditions.       Hoy RegisterEnobong Sharissa Brierley, MD, FAAFP. [facility] [place], [place] [phone]   05/15/2019, 4:10 PM

## 2019-05-16 ENCOUNTER — Encounter: Payer: Self-pay | Admitting: Family Medicine

## 2019-05-16 LAB — BASIC METABOLIC PANEL
BUN/Creatinine Ratio: 22 — ABNORMAL HIGH (ref 9–20)
BUN: 18 mg/dL (ref 6–20)
CO2: 19 mmol/L — ABNORMAL LOW (ref 20–29)
Calcium: 9.7 mg/dL (ref 8.7–10.2)
Chloride: 98 mmol/L (ref 96–106)
Creatinine, Ser: 0.83 mg/dL (ref 0.76–1.27)
GFR calc Af Amer: 130 mL/min/{1.73_m2} (ref >59)
GFR calc non Af Amer: 112 mL/min/{1.73_m2} (ref >59)
Glucose: 78 mg/dL (ref 65–99)
Potassium: 4.6 mmol/L (ref 3.5–5.2)
Sodium: 135 mmol/L (ref 134–144)

## 2019-05-16 LAB — VITAMIN D 25 HYDROXY (VIT D DEFICIENCY, FRACTURES): Vit D, 25-Hydroxy: 24.8 ng/mL — ABNORMAL LOW (ref 30.0–100.0)

## 2019-05-17 ENCOUNTER — Telehealth: Payer: Self-pay

## 2019-05-17 NOTE — Telephone Encounter (Signed)
-----   Message from Charlott Rakes, MD sent at 05/16/2019  9:32 AM EST ----- Vitamin D level is still low.  Continue vitamin D replacement.  Other labs are stable.

## 2019-05-17 NOTE — Telephone Encounter (Signed)
Patient does not have a voicemail set up to leave a message. 

## 2019-06-05 ENCOUNTER — Other Ambulatory Visit: Payer: Self-pay

## 2019-06-05 MED ORDER — VITAMIN D (ERGOCALCIFEROL) 1.25 MG (50000 UNIT) PO CAPS: 50000.0000 [IU] | ORAL_CAPSULE | ORAL | 0 refills | Status: DC

## 2019-07-23 ENCOUNTER — Other Ambulatory Visit: Payer: Self-pay | Admitting: Pharmacist

## 2019-07-23 MED ORDER — VITAMIN D (ERGOCALCIFEROL) 1.25 MG (50000 UNIT) PO CAPS: 50000.0000 [IU] | ORAL_CAPSULE | ORAL | 2 refills | Status: DC

## 2019-10-11 ENCOUNTER — Telehealth: Payer: Self-pay | Admitting: Family Medicine

## 2019-10-11 ENCOUNTER — Other Ambulatory Visit: Payer: Self-pay | Admitting: Pharmacist

## 2019-10-11 MED ORDER — VITAMIN D (ERGOCALCIFEROL) 1.25 MG (50000 UNIT) PO CAPS: 50000.0000 [IU] | ORAL_CAPSULE | ORAL | 2 refills | Status: DC

## 2019-10-11 NOTE — Telephone Encounter (Signed)
Refills placed

## 2019-10-11 NOTE — Telephone Encounter (Signed)
Pt called to request a refill for the Lisinopril, and vitamin D please sent to [facility] pharmacy

## 2020-02-21 ENCOUNTER — Ambulatory Visit (HOSPITAL_COMMUNITY)
Admission: EM | Admit: 2020-02-21 | Discharge: 2020-02-21 | Disposition: A | Discharge status: Home / Self Care | Payer: Self-pay | Attending: Physician Assistant | Admitting: Physician Assistant

## 2020-02-21 ENCOUNTER — Other Ambulatory Visit: Payer: Self-pay

## 2020-02-21 ENCOUNTER — Encounter (HOSPITAL_COMMUNITY): Payer: Self-pay

## 2020-02-21 DIAGNOSIS — R040 Epistaxis: Secondary | ICD-10-CM

## 2020-02-21 MED ORDER — FLUTICASONE PROPIONATE 50 MCG/ACT NA SUSP: 1.0000 | Freq: Every day | NASAL | 0 refills | Status: DC

## 2020-02-21 NOTE — ED Provider Notes (Signed)
[facility]    CSN: [id number] Arrival date & time: 02/21/20  0102      History   Chief Complaint Chief Complaint  Patient presents with  . Epistaxis    HPI [name] [name] is a 39 y.o. male.   Patient reports he has had 3 nosebleeds in the past week from the right nare.  Patient is concerned that he has some type of bleeding disorder.  He is concerned that he may need to have some type of blood work done.  Patient denies any fever or chills he has not had any cough no congestion he denies any Covid risk  The history is provided by the patient. A language interpreter was used.  Epistaxis Location:  R nare   Past Medical History:  Diagnosis Date  . Allergy   . Chronic kidney disease   . Hyperlipidemia   . Hypertension     Patient Active Problem List   Diagnosis Date Noted  . Vitamin D deficiency 11/24/2017  . Leukonychia 06/23/2017  . Anal fissure 11/01/2016  . Rectal pain 11/01/2016  . Rectal bleeding 11/01/2016  . Hemorrhoid 08/08/2016  . UTI (urinary tract infection) 07/15/2016  . Hyponatremia 07/15/2016  . Anemia 07/15/2016  . Sepsis (HCC) 07/15/2016  . 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 Hypertension 03/28/2016  . Hyperlipidemia 03/28/2016    Past Surgical History:  Procedure Laterality Date  . CYSTOSCOPY W/ RETROGRADES Left 07/08/2016   Procedure: CYSTOSCOPY WITH RETROGRADE PYELOGRAM AND STENT PLACEMENT;  Surgeon: Sebastian Ache, MD;  Location: [facility];  Service: Urology;  Laterality: Left;  . NO PAST SURGERIES    . ROBOT ASSISTED PYELOPLASTY Left 07/08/2016   Procedure: XI ROBOTIC ASSISTED PYELOPLASTY;  Surgeon: Sebastian Ache, MD;  Location: [facility];  Service: Urology;  Laterality: Left;       Home Medications    Prior to Admission medications   Medication Sig Start Date End Date Taking? Authorizing Provider  atorvastatin (LIPITOR) 20 MG tablet Take 1 tablet (20 mg total) by mouth daily. 05/15/19  Yes Newlin, Odette Horns,  MD  lisinopril-hydrochlorothiazide (ZESTORETIC) 10-12.5 MG tablet Take 1 tablet by mouth daily. 05/15/19  Yes Hoy Register, MD  clotrimazole (LOTRIMIN) 1 % cream Apply 1 application topically 2 (two) times daily. 03/15/18   Hoy Register, MD  fluticasone (FLONASE) 50 MCG/ACT nasal spray Place 1 spray into both nostrils daily. 02/21/20 02/20/21  Elson Areas, PA-C  Omega-3 Fatty Acids (FISH OIL) 500 MG CAPS Take 1 each by mouth daily.    [provider]  Psyllium (METAMUCIL PO) Take by mouth as needed.    [provider]  Vitamin D, Ergocalciferol, (DRISDOL) 1.25 MG (50000 UNIT) CAPS capsule Take 1 capsule (50,000 Units total) by mouth every 7 (seven) days. 10/11/19   Hoy Register, MD    Family History Family History  Problem Relation Age of Onset  . Hypertension Father   . Leukemia Daughter        age 54 yrs old   . Colon cancer Neg Hx   . Stomach cancer Neg Hx   . Esophageal cancer Neg Hx   . Rectal cancer Neg Hx     Social History Social History   Tobacco Use  . Smoking status: Never Smoker  . Smokeless tobacco: Never Used  Vaping Use  . Vaping Use: Never used  Substance Use Topics  . Alcohol use: Yes    Alcohol/week: 8.0 - 10.0 standard drinks    Types: 8 -  10 Cans of beer per week    Comment: occ   . Drug use: No     Allergies   Doxycycline   Review of Systems Review of Systems  HENT: Positive for nosebleeds.   All other systems reviewed and are negative.    Physical Exam Triage Vital Signs ED Triage Vitals  Enc Vitals Group     BP 02/21/20 2026 118/72     Pulse Rate 02/21/20 2026 68     Resp 02/21/20 2026 18     Temp 02/21/20 2026 98.3 F (36.8 C)     Temp Source 02/21/20 2026 Oral     SpO2 02/21/20 2026 99 %     Weight --      Height --      Head Circumference --      Peak Flow --      Pain Score 02/21/20 2028 0     Pain Loc --      Pain Edu? --      Excl. in GC? --    No data found.  Updated Vital Signs BP 118/72  (BP Location: Right Arm)   Pulse 68   Temp 98.3 F (36.8 C) (Oral)   Resp 18   SpO2 99%   Visual Acuity Right Eye Distance:   Left Eye Distance:   Bilateral Distance:    Right Eye Near:   Left Eye Near:    Bilateral Near:     Physical Exam Vitals and nursing note reviewed.  Constitutional:      Appearance: He is well-developed.  HENT:     Head: Normocephalic.     Nose:     Comments: Right nare is erythematous and boggy blood vessels visualized no active bleeding Pulmonary:     Effort: Pulmonary effort is normal.  Abdominal:     General: There is no distension.  Musculoskeletal:        General: Normal range of motion.     Cervical back: Normal range of motion.  Neurological:     Mental Status: He is alert and oriented to person, place, and time.      UC Treatments / Results  Labs (all labs ordered are listed, but only abnormal results are displayed) Labs Reviewed - No data to display  EKG   Radiology No results found.  Procedures Procedures (including critical care time)  Medications Ordered in UC Medications - No data to display  Initial Impression / Assessment and Plan / UC Course  I have reviewed the triage vital signs and the nursing notes.  Pertinent labs & imaging results that were available during my care of the patient were reviewed by me and considered in my medical decision making (see chart for details).     MDM: Patient counseled on nosebleeds and causes he is advised to keep his nose moist I have advised him to try Flonase for his congestion.  Advised to return if any problems Final Clinical Impressions(s) / UC Diagnoses   Final diagnoses:  Anterior epistaxis   Discharge Instructions   None    ED Prescriptions    Medication Sig Dispense Auth. Provider   fluticasone (FLONASE) 50 MCG/ACT nasal spray Place 1 spray into both nostrils daily. 1 g Elson Areas, [place]     PDMP not reviewed this encounter. An After Visit Summary was  printed and given to the patient.    Elson Areas, [place] 02/21/20 2155

## 2020-02-21 NOTE — ED Triage Notes (Signed)
Pt c/o nosebleeds intermittently for approx 4 days through right nostril after blowing his nose.  Denies HA, congestion, spontaneous bleeding from nose, sore throat, or other complaints.

## 2020-03-04 NOTE — Progress Notes (Deleted)
Patient ID: [name] [name], male   DOB: 02-Jan-1981, 39 y.o.   MRN: [id number]   Seen in ED 02/21/2020 for on and off nose bleeds for about 1 month.  He was started on flonase.

## 2020-03-11 ENCOUNTER — Other Ambulatory Visit: Payer: Self-pay

## 2020-03-11 ENCOUNTER — Ambulatory Visit: Payer: Self-pay | Admitting: Physician Assistant

## 2020-03-12 ENCOUNTER — Ambulatory Visit: Payer: Self-pay | Attending: Physician Assistant | Admitting: Physician Assistant

## 2020-03-12 DIAGNOSIS — I10 Essential (primary) hypertension: Secondary | ICD-10-CM

## 2020-03-12 DIAGNOSIS — R040 Epistaxis: Secondary | ICD-10-CM

## 2020-03-12 DIAGNOSIS — E7800 Pure hypercholesterolemia, unspecified: Secondary | ICD-10-CM

## 2020-03-12 DIAGNOSIS — Z131 Encounter for screening for diabetes mellitus: Secondary | ICD-10-CM

## 2020-03-12 NOTE — Progress Notes (Signed)
Patient ID: [name] [name], male   DOB: 14-May-1981, 39 y.o.   MRN: [id number]   Virtual Visit via Telephone Note  I connected with [name] [name] on 03/12/20 at  4:10 PM EDT by telephone and verified that I am speaking with the correct person using two identifiers.   I discussed the limitations, risks, security and privacy concerns of performing an evaluation and management service by telephone and the availability of in person appointments. I also discussed with the patient that there may be a patient responsible charge related to this service. The patient expressed understanding and agreed to proceed.  PATIENT visit by telephone virtually in the context of Covid-19 pandemic. Patient location:  home My Location:  [facility] office Persons on the call:  Me and the patient and interpreter    History of Present Illness: After being seen in the [facility] 02/21/2020 for nose bleeds.  He says he has not had any further nose bleeds since starting flonase.  He has not had other bleeding.  Wants to be checked for bleeding disorder.  Also wants other screening blood work DM and cholesterol.  Negative ROS  fromUC note:  HPI [name] [name] is a 39 y.o. male.  Patient reports he has had 3 nosebleeds in the past week from the right nare.  Patient is concerned that he has some type of bleeding disorder.  He is concerned that he may need to have some type of blood work done.  Patient denies any fever or chills he has not had any cough no congestion he denies any Covid risk Observations/Objective:  NAD   Assessment and Plan: 1. Pure hypercholesterolemia - Lipid panel; Future  2. Bleeding from the nose resolved - CBC with Differential/Platelet; Future - Comprehensive metabolic panel; Future  3. Screening for diabetes mellitus I have had a lengthy discussion and provided education about insulin resistance and the intake of too much sugar/refined carbohydrates.  I have advised the patient to work at  a goal of eliminating sugary drinks, candy, desserts, sweets, refined sugars, processed foods, and white carbohydrates.  The patient expresses understanding.   - Hemoglobin A1c; Future  4. Essential hypertension - Comprehensive metabolic panel; Future Continue current regimen   Follow Up Instructions: See PCP in 4-6 months   I discussed the assessment and treatment plan with the patient. The patient was provided an opportunity to ask questions and all were answered. The patient agreed with the plan and demonstrated an understanding of the instructions.   The patient was advised to call back or seek an in-person evaluation if the symptoms worsen or if the condition fails to improve as anticipated.  I provided 14 minutes of non-face-to-face time during this encounter.   Georgian Co, PA-C

## 2020-03-17 ENCOUNTER — Other Ambulatory Visit: Payer: Self-pay

## 2020-03-17 ENCOUNTER — Ambulatory Visit: Payer: Self-pay | Attending: Family Medicine

## 2020-03-17 DIAGNOSIS — R040 Epistaxis: Secondary | ICD-10-CM

## 2020-03-17 DIAGNOSIS — Z131 Encounter for screening for diabetes mellitus: Secondary | ICD-10-CM

## 2020-03-17 DIAGNOSIS — I10 Essential (primary) hypertension: Secondary | ICD-10-CM

## 2020-03-17 DIAGNOSIS — E7800 Pure hypercholesterolemia, unspecified: Secondary | ICD-10-CM

## 2020-03-18 ENCOUNTER — Ambulatory Visit: Payer: Self-pay

## 2020-03-18 LAB — COMPREHENSIVE METABOLIC PANEL
ALT: 71 IU/L — ABNORMAL HIGH (ref 0–44)
AST: 58 IU/L — ABNORMAL HIGH (ref 0–40)
Albumin/Globulin Ratio: 1.7 (ref 1.2–2.2)
Albumin: 4.7 g/dL (ref 4.0–5.0)
Alkaline Phosphatase: 106 IU/L (ref 44–121)
BUN/Creatinine Ratio: 16 (ref 9–20)
BUN: 13 mg/dL (ref 6–20)
Bilirubin Total: 0.4 mg/dL (ref 0.0–1.2)
CO2: 21 mmol/L (ref 20–29)
Calcium: 9.1 mg/dL (ref 8.7–10.2)
Chloride: 102 mmol/L (ref 96–106)
Creatinine, Ser: 0.79 mg/dL (ref 0.76–1.27)
GFR calc Af Amer: 132 mL/min/{1.73_m2} (ref >59)
GFR calc non Af Amer: 114 mL/min/{1.73_m2} (ref >59)
Globulin, Total: 2.8 g/dL (ref 1.5–4.5)
Glucose: 92 mg/dL (ref 65–99)
Potassium: 4.3 mmol/L (ref 3.5–5.2)
Sodium: 138 mmol/L (ref 134–144)
Total Protein: 7.5 g/dL (ref 6.0–8.5)

## 2020-03-18 LAB — LIPID PANEL
Chol/HDL Ratio: 3.6 ratio (ref 0.0–5.0)
Cholesterol, Total: 210 mg/dL — ABNORMAL HIGH (ref 100–199)
HDL: 58 mg/dL (ref >39)
LDL Chol Calc (NIH): 97 mg/dL (ref 0–99)
Triglycerides: 330 mg/dL — ABNORMAL HIGH (ref 0–149)
VLDL Cholesterol Cal: 55 mg/dL — ABNORMAL HIGH (ref 5–40)

## 2020-03-18 LAB — HEMOGLOBIN A1C
Est. average glucose Bld gHb Est-mCnc: 120 mg/dL
Hgb A1c MFr Bld: 5.8 % — ABNORMAL HIGH (ref 4.8–5.6)

## 2020-03-18 LAB — CBC WITH DIFFERENTIAL/PLATELET
Basophils Absolute: 0 10*3/uL (ref 0.0–0.2)
Basos: 1 %
EOS (ABSOLUTE): 0.2 10*3/uL (ref 0.0–0.4)
Eos: 4 %
Hematocrit: 48 % (ref 37.5–51.0)
Hemoglobin: 16 g/dL (ref 13.0–17.7)
Immature Grans (Abs): 0 10*3/uL (ref 0.0–0.1)
Immature Granulocytes: 0 %
Lymphocytes Absolute: 1.8 10*3/uL (ref 0.7–3.1)
Lymphs: 37 %
MCH: 29.3 pg (ref 26.6–33.0)
MCHC: 33.3 g/dL (ref 31.5–35.7)
MCV: 88 fL (ref 79–97)
Monocytes Absolute: 0.4 10*3/uL (ref 0.1–0.9)
Monocytes: 7 %
Neutrophils Absolute: 2.6 10*3/uL (ref 1.4–7.0)
Neutrophils: 51 %
Platelets: 205 10*3/uL (ref 150–450)
RBC: 5.46 x10E6/uL (ref 4.14–5.80)
RDW: 13.5 % (ref 11.6–15.4)
WBC: 4.9 10*3/uL (ref 3.4–10.8)

## 2020-03-19 ENCOUNTER — Other Ambulatory Visit: Payer: Self-pay | Admitting: *Deleted

## 2020-03-19 DIAGNOSIS — E7800 Pure hypercholesterolemia, unspecified: Secondary | ICD-10-CM

## 2020-03-19 NOTE — Progress Notes (Signed)
Patient aware of message per Dr. Alvis Lemmings and appt. Verbalized understanding.

## 2020-03-19 NOTE — Progress Notes (Signed)
Message below is per Dr. Alvis Lemmings for this patient: Can you pls place order for CMET for him for 1 month for recheck of LFTs and if trending higher then i would recommend d/c . Right now elevation is mild ___________________________________

## 2020-04-07 ENCOUNTER — Other Ambulatory Visit: Payer: Self-pay | Admitting: Family Medicine

## 2020-04-07 DIAGNOSIS — E7800 Pure hypercholesterolemia, unspecified: Secondary | ICD-10-CM

## 2020-04-07 MED ORDER — ATORVASTATIN CALCIUM 20 MG PO TABS: 20.0000 mg | ORAL_TABLET | Freq: Every day | ORAL | 2 refills | Status: DC

## 2020-04-07 NOTE — Telephone Encounter (Signed)
Medication Refill - Medication: atorvastatin   Has the patient contacted their pharmacy? Yes.   PT states that he took the last one today. Please advise.  (Agent: If no, request that the patient contact the pharmacy for the refill.) (Agent: If yes, when and what did the pharmacy advise?)  Preferred Pharmacy (with phone number or street name):  [facility] - [place], [place] - [address]  [address] [place] [address]  Phone: [phone] Fax: [phone]  Hours: Not open 24 hours     Agent: Please be advised that RX refills may take up to 3 business days. We ask that you follow-up with your pharmacy.

## 2020-04-13 ENCOUNTER — Other Ambulatory Visit: Payer: Self-pay | Admitting: Family Medicine

## 2020-04-13 ENCOUNTER — Ambulatory Visit: Payer: Self-pay | Attending: Family Medicine

## 2020-04-13 DIAGNOSIS — R7989 Other specified abnormal findings of blood chemistry: Secondary | ICD-10-CM

## 2020-04-14 LAB — CMP14+EGFR
ALT: 55 IU/L — ABNORMAL HIGH (ref 0–44)
AST: 39 IU/L (ref 0–40)
Albumin/Globulin Ratio: 1.8 (ref 1.2–2.2)
Albumin: 4.9 g/dL (ref 4.0–5.0)
Alkaline Phosphatase: 98 IU/L (ref 44–121)
BUN/Creatinine Ratio: 23 — ABNORMAL HIGH (ref 9–20)
BUN: 17 mg/dL (ref 6–20)
Bilirubin Total: 0.4 mg/dL (ref 0.0–1.2)
CO2: 25 mmol/L (ref 20–29)
Calcium: 9.5 mg/dL (ref 8.7–10.2)
Chloride: 100 mmol/L (ref 96–106)
Creatinine, Ser: 0.75 mg/dL — ABNORMAL LOW (ref 0.76–1.27)
GFR calc Af Amer: 135 mL/min/{1.73_m2} (ref >59)
GFR calc non Af Amer: 116 mL/min/{1.73_m2} (ref >59)
Globulin, Total: 2.8 g/dL (ref 1.5–4.5)
Glucose: 88 mg/dL (ref 65–99)
Potassium: 4.1 mmol/L (ref 3.5–5.2)
Sodium: 140 mmol/L (ref 134–144)
Total Protein: 7.7 g/dL (ref 6.0–8.5)

## 2020-04-17 ENCOUNTER — Telehealth: Payer: Self-pay

## 2020-04-17 NOTE — Telephone Encounter (Signed)
Patient name and DOB has been verified Patient was informed of lab results. Patient had no questions.  

## 2020-04-17 NOTE — Telephone Encounter (Signed)
-----   Message from Hoy Register, MD sent at 04/14/2020 10:54 AM EDT ----- Liver enzymes have improved.  Please advise him to continue his current medications including cholesterol pills

## 2020-06-01 ENCOUNTER — Other Ambulatory Visit: Payer: Self-pay | Admitting: Family Medicine

## 2020-06-01 DIAGNOSIS — I10 Essential (primary) hypertension: Secondary | ICD-10-CM

## 2020-10-03 ENCOUNTER — Other Ambulatory Visit: Payer: Self-pay

## 2020-10-03 ENCOUNTER — Ambulatory Visit (HOSPITAL_COMMUNITY)
Admission: EM | Admit: 2020-10-03 | Discharge: 2020-10-03 | Disposition: A | Discharge status: Home / Self Care | Payer: Self-pay | Attending: Urgent Care | Admitting: Urgent Care

## 2020-10-03 ENCOUNTER — Encounter (HOSPITAL_COMMUNITY): Payer: Self-pay | Admitting: *Deleted

## 2020-10-03 DIAGNOSIS — M546 Pain in thoracic spine: Secondary | ICD-10-CM

## 2020-10-03 DIAGNOSIS — Z87442 Personal history of urinary calculi: Secondary | ICD-10-CM

## 2020-10-03 LAB — POCT URINALYSIS DIPSTICK, ED / UC
Bilirubin Urine: NEGATIVE
Glucose, UA: NEGATIVE mg/dL
Hgb urine dipstick: NEGATIVE
Ketones, ur: NEGATIVE mg/dL
Leukocytes,Ua: NEGATIVE
Nitrite: NEGATIVE
Protein, ur: NEGATIVE mg/dL
Specific Gravity, Urine: 1.015 (ref 1.005–1.030)
Urobilinogen, UA: 0.2 mg/dL (ref 0.0–1.0)
pH: 7 (ref 5.0–8.0)

## 2020-10-03 MED ORDER — NAPROXEN 500 MG PO TABS
500.0000 mg | ORAL_TABLET | Freq: Two times a day (BID) | ORAL | 0 refills | Status: DC
  Filled 2020-10-03: qty 30, 15d supply, fill #0

## 2020-10-03 MED ORDER — TIZANIDINE HCL 4 MG PO TABS: 4.0000 mg | ORAL_TABLET | Freq: Three times a day (TID) | ORAL | 0 refills | Status: DC | PRN

## 2020-10-03 MED ORDER — NAPROXEN 500 MG PO TABS: 500.0000 mg | ORAL_TABLET | Freq: Two times a day (BID) | ORAL | 0 refills | Status: DC

## 2020-10-03 MED ORDER — TIZANIDINE HCL 4 MG PO TABS
4.0000 mg | ORAL_TABLET | Freq: Three times a day (TID) | ORAL | 0 refills | Status: DC | PRN
  Filled 2020-10-03: qty 30, 10d supply, fill #0

## 2020-10-03 NOTE — ED Triage Notes (Signed)
Pt reports Rt mid back pain for one week denies any injury. Pain increases with movement.

## 2020-10-03 NOTE — ED Provider Notes (Signed)
[facility]   MRN: [id number] DOB: 11/29/1980  Subjective:   [name] [name] is a 40 y.o. male presenting for 1 week history of persistent right-sided thoracic back pain.  Patient states that there was no trauma, fall.  He does a lot of strenuous work, Youth worker.  States that he hydrates very well, drinks about a gallon of water a day.  Denies fever, nausea, vomiting, dysuria, hematuria, urinary frequency, constipation.  Patient does have a history of a ureteral pelvic obstruction from a renal calculus.  He had this surgically repaired over the left side in 2018.  Has not tried any medications for relief.  States that he only feels the pain in the morning when he wakes up and starts to move around.   Current Facility-Administered Medications:  .  0.9 %  sodium chloride infusion, 500 mL, Intravenous, Continuous, Nandigam, Kavitha V, MD  Current Outpatient Medications:  .  atorvastatin (LIPITOR) 20 MG tablet, TAKE 1 TABLET (20 MG TOTAL) BY MOUTH DAILY., Disp: 90 tablet, Rfl: 2 .  clotrimazole (LOTRIMIN) 1 % cream, Apply 1 application topically 2 (two) times daily., Disp: 30 g, Rfl: 0 .  fluticasone (FLONASE) 50 MCG/ACT nasal spray, Place 1 spray into both nostrils daily., Disp: 1 g, Rfl: 0 .  lisinopril-hydrochlorothiazide (ZESTORETIC) 10-12.5 MG tablet, TAKE 1 TABLET BY MOUTH DAILY., Disp: 90 tablet, Rfl: 1 .  Omega-3 Fatty Acids (FISH OIL) 500 MG CAPS, Take 1 each by mouth daily., Disp: , Rfl:  .  Psyllium (METAMUCIL PO), Take by mouth as needed., Disp: , Rfl:  .  Vitamin D, Ergocalciferol, (DRISDOL) 1.25 MG (50000 UNIT) CAPS capsule, Take 1 capsule (50,000 Units total) by mouth every 7 (seven) days., Disp: 4 capsule, Rfl: 2   Allergies  Allergen Reactions  . Doxycycline Rash    Past Medical History:  Diagnosis Date  . Allergy   . Chronic kidney disease   . Hyperlipidemia   . Hypertension      Past Surgical History:  Procedure Laterality Date  .  CYSTOSCOPY W/ RETROGRADES Left 07/08/2016   Procedure: CYSTOSCOPY WITH RETROGRADE PYELOGRAM AND STENT PLACEMENT;  Surgeon: Sebastian Ache, MD;  Location: [facility];  Service: Urology;  Laterality: Left;  . NO PAST SURGERIES    . ROBOT ASSISTED PYELOPLASTY Left 07/08/2016   Procedure: XI ROBOTIC ASSISTED PYELOPLASTY;  Surgeon: Sebastian Ache, MD;  Location: [facility];  Service: Urology;  Laterality: Left;    Family History  Problem Relation Age of Onset  . Hypertension Father   . Leukemia Daughter        age 69 yrs old   . Colon cancer Neg Hx   . Stomach cancer Neg Hx   . Esophageal cancer Neg Hx   . Rectal cancer Neg Hx     Social History   Tobacco Use  . Smoking status: Never Smoker  . Smokeless tobacco: Never Used  Vaping Use  . Vaping Use: Never used  Substance Use Topics  . Alcohol use: Yes    Alcohol/week: 8.0 - 10.0 standard drinks    Types: 8 - 10 Cans of beer per week    Comment: occ   . Drug use: No    ROS   Objective:   Vitals: BP 130/69 (BP Location: Right Arm)   Pulse 85   Temp 98.4 F (36.9 C) (Oral)   Resp 16   SpO2 99%   Physical Exam Constitutional:      General: He is  not in acute distress.    Appearance: Normal appearance. He is well-developed and normal weight. He is not ill-appearing, toxic-appearing or diaphoretic.  HENT:     Head: Normocephalic and atraumatic.     Right Ear: External ear normal.     Left Ear: External ear normal.     Nose: Nose normal.     Mouth/Throat:     Pharynx: Oropharynx is clear.  Eyes:     General: No scleral icterus.       Right eye: No discharge.        Left eye: No discharge.     Extraocular Movements: Extraocular movements intact.     Pupils: Pupils are equal, round, and reactive to light.  Cardiovascular:     Rate and Rhythm: Normal rate.  Pulmonary:     Effort: Pulmonary effort is normal.  Musculoskeletal:     Cervical back: Normal range of motion.     Thoracic back: No swelling, edema, deformity, signs of  trauma, lacerations, spasms, tenderness or bony tenderness. Normal range of motion. No scoliosis.  Skin:    General: Skin is warm and dry.  Neurological:     Mental Status: He is alert and oriented to person, place, and time.  Psychiatric:        Mood and Affect: Mood normal.        Behavior: Behavior normal.        Thought Content: Thought content normal.        Judgment: Judgment normal.     Results for orders placed or performed during the hospital encounter of 10/03/20 (from the past 24 hour(s))  POC Urinalysis dipstick     Status: None   Collection Time: 10/03/20  2:30 PM  Result Value Ref Range   Glucose, UA NEGATIVE NEGATIVE mg/dL   Bilirubin Urine NEGATIVE NEGATIVE   Ketones, ur NEGATIVE NEGATIVE mg/dL   Specific Gravity, Urine 1.015 1.005 - 1.030   Hgb urine dipstick NEGATIVE NEGATIVE   pH 7.0 5.0 - 8.0   Protein, ur NEGATIVE NEGATIVE mg/dL   Urobilinogen, UA 0.2 0.0 - 1.0 mg/dL   Nitrite NEGATIVE NEGATIVE   Leukocytes,Ua NEGATIVE NEGATIVE    Assessment and Plan :   PDMP not reviewed this encounter.  1. Acute right-sided thoracic back pain   2. Personal history of renal calculi     No tenderness elicited on exam, patient does not have any in clinic as well.  Urinalysis equivocal.  Counseled that we will manage for musculoskeletal pain with naproxen and tizanidine.  Maintain consistent hydration. Counseled patient on potential for adverse effects with medications prescribed/recommended today, ER and return-to-clinic precautions discussed, patient verbalized understanding.    Wallis Bamberg, PA-C 10/03/20 1500

## 2020-10-04 ENCOUNTER — Emergency Department (HOSPITAL_COMMUNITY): Payer: Self-pay

## 2020-10-04 ENCOUNTER — Emergency Department (HOSPITAL_COMMUNITY)
Admission: EM | Admit: 2020-10-04 | Discharge: 2020-10-04 | Disposition: A | Discharge status: Home / Self Care | Payer: Self-pay | Attending: Emergency Medicine | Admitting: Emergency Medicine

## 2020-10-04 ENCOUNTER — Other Ambulatory Visit: Payer: Self-pay

## 2020-10-04 ENCOUNTER — Encounter (HOSPITAL_COMMUNITY): Payer: Self-pay | Admitting: Emergency Medicine

## 2020-10-04 DIAGNOSIS — M47814 Spondylosis without myelopathy or radiculopathy, thoracic region: Secondary | ICD-10-CM

## 2020-10-04 DIAGNOSIS — N189 Chronic kidney disease, unspecified: Secondary | ICD-10-CM | POA: Insufficient documentation

## 2020-10-04 DIAGNOSIS — Z79899 Other long term (current) drug therapy: Secondary | ICD-10-CM | POA: Insufficient documentation

## 2020-10-04 DIAGNOSIS — I129 Hypertensive chronic kidney disease with stage 1 through stage 4 chronic kidney disease, or unspecified chronic kidney disease: Secondary | ICD-10-CM | POA: Insufficient documentation

## 2020-10-04 DIAGNOSIS — M47894 Other spondylosis, thoracic region: Secondary | ICD-10-CM | POA: Insufficient documentation

## 2020-10-04 DIAGNOSIS — R109 Unspecified abdominal pain: Secondary | ICD-10-CM | POA: Insufficient documentation

## 2020-10-04 LAB — URINALYSIS, ROUTINE W REFLEX MICROSCOPIC
Bilirubin Urine: NEGATIVE
Glucose, UA: NEGATIVE mg/dL
Hgb urine dipstick: NEGATIVE
Ketones, ur: NEGATIVE mg/dL
Leukocytes,Ua: NEGATIVE
Nitrite: NEGATIVE
Protein, ur: NEGATIVE mg/dL
Specific Gravity, Urine: 1.014 (ref 1.005–1.030)
pH: 6 (ref 5.0–8.0)

## 2020-10-04 LAB — CBC WITH DIFFERENTIAL/PLATELET
Abs Immature Granulocytes: 0.02 10*3/uL (ref 0.00–0.07)
Basophils Absolute: 0 10*3/uL (ref 0.0–0.1)
Basophils Relative: 0 %
Eosinophils Absolute: 0.2 10*3/uL (ref 0.0–0.5)
Eosinophils Relative: 4 %
HCT: 49.8 % (ref 39.0–52.0)
Hemoglobin: 16.2 g/dL (ref 13.0–17.0)
Immature Granulocytes: 0 %
Lymphocytes Relative: 32 %
Lymphs Abs: 1.7 10*3/uL (ref 0.7–4.0)
MCH: 29.6 pg (ref 26.0–34.0)
MCHC: 32.5 g/dL (ref 30.0–36.0)
MCV: 91 fL (ref 80.0–100.0)
Monocytes Absolute: 0.3 10*3/uL (ref 0.1–1.0)
Monocytes Relative: 5 %
Neutro Abs: 3.1 10*3/uL (ref 1.7–7.7)
Neutrophils Relative %: 59 %
Platelets: 191 10*3/uL (ref 150–400)
RBC: 5.47 MIL/uL (ref 4.22–5.81)
RDW: 13.4 % (ref 11.5–15.5)
WBC: 5.3 10*3/uL (ref 4.0–10.5)
nRBC: 0 % (ref 0.0–0.2)

## 2020-10-04 LAB — BASIC METABOLIC PANEL
Anion gap: 6 (ref 5–15)
BUN: 15 mg/dL (ref 6–20)
CO2: 25 mmol/L (ref 22–32)
Calcium: 9.4 mg/dL (ref 8.9–10.3)
Chloride: 106 mmol/L (ref 98–111)
Creatinine, Ser: 0.79 mg/dL (ref 0.61–1.24)
GFR, Estimated: >60 mL/min (ref >60)
Glucose, Bld: 128 mg/dL — ABNORMAL HIGH (ref 70–99)
Potassium: 4.2 mmol/L (ref 3.5–5.1)
Sodium: 137 mmol/L (ref 135–145)

## 2020-10-04 NOTE — Discharge Instructions (Addendum)
Acetaminophen (Tylenol) 1000 mg up to three times per day is helpful. Lidocaine patches or topical rubs can be helpful.

## 2020-10-04 NOTE — ED Provider Notes (Signed)
Patient placed in Quick Look pathway, seen and evaluated   Chief Complaint: Right back / Flank pain  HPI:   40 year old male presents with 1 week of right back and flank pain, was seen at urgent care for this yesterday had unremarkable UA and was treated for musculoskeletal back pain.  He reports that this seems to be helping somewhat but he is still having the pain and is concerned.  Denies history of kidney stones but reports he thinks he had a kidney surgery several years ago.  ROS: + Back and flank pain  -Fever, vomiting, urinary symptoms  Physical Exam:   Gen: No distress  Neuro: Awake and Alert  Skin: Warm    Focused Exam: Unable to elicit any back tenderness but patient localizes pain directly over the right flank and kidney   Initiation of care has begun. The patient has been counseled on the process, plan, and necessity for staying for the completion/evaluation, and the remainder of the medical screening examination   MSE was initiated and I personally evaluated the patient and placed orders (if any) at  10:20 AM on October 04, 2020.  The patient appears stable so that the remainder of the MSE may be completed by another provider.   Dartha Lodge, PA-C 10/04/20 1026    Virgina Norfolk, DO 10/04/20 1034

## 2020-10-04 NOTE — ED Provider Notes (Signed)
[facility] EMERGENCY DEPARTMENT Provider Note   CSN: [id number] Arrival date & time: 10/04/20  1011     History Chief Complaint  Patient presents with  . Back Pain    [name] [name] is a 40 y.o. male.  The history is provided by the patient. A language interpreter was used.  Back Pain Location:  Thoracic spine (right mid back) Quality: throbbing. Radiates to:  Does not radiate Pain severity:  Mild Pain is:  Worse during the night Onset quality:  Gradual Duration:  6 days Timing:  Intermittent Progression:  Waxing and waning Chronicity:  New Context: not recent illness, not recent injury and not twisting   Relieved by:  Nothing Worsened by:  Movement and lying down Ineffective treatments:  Ibuprofen and muscle relaxants Associated symptoms: no abdominal pain, no bladder incontinence, no bowel incontinence, no chest pain, no dysuria, no fever, no leg pain, no numbness, no paresthesias, no tingling, no weakness and no weight loss        Past Medical History:  Diagnosis Date  . Allergy   . Chronic kidney disease   . Hyperlipidemia   . Hypertension     Patient Active Problem List   Diagnosis Date Noted  . Vitamin D deficiency 11/24/2017  . Leukonychia 06/23/2017  . Anal fissure 11/01/2016  . Rectal pain 11/01/2016  . Rectal bleeding 11/01/2016  . Hemorrhoid 08/08/2016  . UTI (urinary tract infection) 07/15/2016  . Hyponatremia 07/15/2016  . Anemia 07/15/2016  . Sepsis (HCC) 07/15/2016  . 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 Hypertension 03/28/2016  . Hyperlipidemia 03/28/2016    Past Surgical History:  Procedure Laterality Date  . CYSTOSCOPY W/ RETROGRADES Left 07/08/2016   Procedure: CYSTOSCOPY WITH RETROGRADE PYELOGRAM AND STENT PLACEMENT;  Surgeon: Sebastian Ache, MD;  Location: [facility];  Service: Urology;  Laterality: Left;  . NO PAST SURGERIES    . ROBOT ASSISTED PYELOPLASTY Left 07/08/2016   Procedure: XI ROBOTIC  ASSISTED PYELOPLASTY;  Surgeon: Sebastian Ache, MD;  Location: [facility];  Service: Urology;  Laterality: Left;       Family History  Problem Relation Age of Onset  . Hypertension Father   . Leukemia Daughter        age 19 yrs old   . Colon cancer Neg Hx   . Stomach cancer Neg Hx   . Esophageal cancer Neg Hx   . Rectal cancer Neg Hx     Social History   Tobacco Use  . Smoking status: Never Smoker  . Smokeless tobacco: Never Used  Vaping Use  . Vaping Use: Never used  Substance Use Topics  . Alcohol use: Yes    Alcohol/week: 8.0 - 10.0 standard drinks    Types: 8 - 10 Cans of beer per week    Comment: occ   . Drug use: No    Home Medications Prior to Admission medications   Medication Sig Start Date End Date Taking? Authorizing Provider  atorvastatin (LIPITOR) 20 MG tablet TAKE 1 TABLET (20 MG TOTAL) BY MOUTH DAILY. 04/07/20 04/07/21  Hoy Register, MD  clotrimazole (LOTRIMIN) 1 % cream Apply 1 application topically 2 (two) times daily. 03/15/18   Hoy Register, MD  fluticasone (FLONASE) 50 MCG/ACT nasal spray Place 1 spray into both nostrils daily. 02/21/20 02/20/21  Elson Areas, PA-C  lisinopril-hydrochlorothiazide (ZESTORETIC) 10-12.5 MG tablet TAKE 1 TABLET BY MOUTH DAILY. 06/01/20 06/01/21  Hoy Register, MD  naproxen (NAPROSYN) 500 MG tablet Take 1 tablet (500 mg  total) by mouth 2 (two) times daily with a meal. 10/03/20   Wallis Bamberg, PA-C  Omega-3 Fatty Acids (FISH OIL) 500 MG CAPS Take 1 each by mouth daily.    [provider]  Psyllium (METAMUCIL PO) Take by mouth as needed.    [provider]  tiZANidine (ZANAFLEX) 4 MG tablet Take 1 tablet (4 mg total) by mouth every 8 (eight) hours as needed. 10/03/20   Wallis Bamberg, PA-C  Vitamin D, Ergocalciferol, (DRISDOL) 1.25 MG (50000 UNIT) CAPS capsule Take 1 capsule (50,000 Units total) by mouth every 7 (seven) days. 10/11/19   Hoy Register, MD    Allergies    Doxycycline  Review of Systems   Review  of Systems  Constitutional: Negative for chills, fever and weight loss.  HENT: Negative for ear pain and sore throat.   Eyes: Negative for pain and visual disturbance.  Respiratory: Negative for cough and shortness of breath.   Cardiovascular: Negative for chest pain and palpitations.  Gastrointestinal: Negative for abdominal pain, bowel incontinence and vomiting.  Genitourinary: Negative for bladder incontinence, dysuria and hematuria.  Musculoskeletal: Positive for back pain. Negative for arthralgias.  Skin: Negative for color change and rash.  Neurological: Negative for tingling, seizures, syncope, weakness, numbness and paresthesias.  All other systems reviewed and are negative.   Physical Exam Updated Vital Signs BP (!) 149/84 (BP Location: Left Arm)   Pulse 73   Temp 99 F (37.2 C)   Resp 14   SpO2 99%   Physical Exam Vitals and nursing note reviewed.  Constitutional:      Appearance: He is well-developed.  HENT:     Head: Normocephalic and atraumatic.  Eyes:     Conjunctiva/sclera: Conjunctivae normal.  Cardiovascular:     Rate and Rhythm: Normal rate and regular rhythm.     Heart sounds: No murmur heard.   Pulmonary:     Effort: Pulmonary effort is normal. No respiratory distress.     Breath sounds: Normal breath sounds.  Abdominal:     Palpations: Abdomen is soft.     Tenderness: There is no abdominal tenderness.  Musculoskeletal:     Right shoulder: Normal range of motion.       Arms:     Cervical back: Neck supple. No deformity. Normal range of motion.     Thoracic back: No deformity.     Comments: Mild tenderness to palpation  Skin:    General: Skin is warm and dry.  Neurological:     Mental Status: He is alert.     Sensory: Sensation is intact.     Motor: Motor function is intact.     ED Results / Procedures / Treatments   Labs (all labs ordered are listed, but only abnormal results are displayed) Labs Reviewed  BASIC METABOLIC PANEL -  Abnormal; Notable for the following components:      Result Value   Glucose, Bld 128 (*)    All other components within normal limits  CBC WITH DIFFERENTIAL/PLATELET  URINALYSIS, ROUTINE W REFLEX MICROSCOPIC    EKG None  Radiology DG Chest 2 View  Result Date: 10/04/2020 CLINICAL DATA:  Posterior chest pain for 1 week.  No known injury. EXAM: CHEST - 2 VIEW COMPARISON:  None. FINDINGS: Low lung volumes on the frontal examination with mild resulting bibasilar atelectasis. The aeration of the lungs appears better on the lateral view. There is no confluent airspace opacity, pleural effusion or pneumothorax. The heart size and mediastinal contours are  normal. No acute osseous findings are evident. IMPRESSION: Suboptimal inspiration.  No active cardiopulmonary process. Electronically Signed   By: Carey Bullocks M.D.   On: 10/04/2020 12:34   DG Thoracic Spine 2 View  Result Date: 10/04/2020 CLINICAL DATA:  Posterior chest pain for 1 week.  No known injury. EXAM: THORACIC SPINE 2 VIEWS COMPARISON:  Chest radiographs and abdominal CT same date. FINDINGS: There are 12 rib-bearing thoracic type vertebral bodies. The alignment is normal. No evidence of acute fracture, paraspinal hematoma or widening of the interpedicular distance. There are paraspinal osteophytes throughout the mid to lower thoracic spine. IMPRESSION: Thoracic spondylosis without acute osseous findings or malalignment. Electronically Signed   By: Carey Bullocks M.D.   On: 10/04/2020 12:36   CT Renal Stone Study  Result Date: 10/04/2020 CLINICAL DATA:  Right flank pain 1 week.  Possible kidney stone. EXAM: CT ABDOMEN AND PELVIS WITHOUT CONTRAST TECHNIQUE: Multidetector CT imaging of the abdomen and pelvis was performed following the standard protocol without IV contrast. COMPARISON:  07/21/2016, 05/06/2016 FINDINGS: Lower chest: Lung bases are clear. Hepatobiliary: Liver, gallbladder and biliary tree are normal. Pancreas: Normal. Spleen:  Normal. Adrenals/Urinary Tract: Adrenal glands are normal. Evidence of patient's known horseshoe kidney. Chronic stable prominence of the collecting system/renal pelvis involving the left component of the horseshoe kidney. No evidence of nephrolithiasis or focal mass. Ureters and bladder are normal. No evidence of ureteral stones. Stomach/Bowel: Stomach and small bowel are normal. Appendix is normal. Minimal diverticulosis of the colon. Vascular/Lymphatic: Abdominal aorta is normal caliber. No adenopathy. Reproductive: Normal. Other: No free fluid or focal inflammatory change. Musculoskeletal: No focal abnormality. IMPRESSION: 1. No acute findings in the abdomen/pelvis. 2. Evidence of patient's known horseshoe kidney. Chronic stable prominence of the collecting system/renal pelvis involving the left component of the horseshoe kidney. No evidence of nephrolithiasis or ureteral stones. 3. Minimal colonic diverticulosis. Electronically Signed   By: Elberta Fortis M.D.   On: 10/04/2020 12:23    Procedures Procedures   Medications Ordered in ED Medications - No data to display  ED Course  I have reviewed the triage vital signs and the nursing notes.  Pertinent labs & imaging results that were available during my care of the patient were reviewed by me and considered in my medical decision making (see chart for details).    MDM Rules/Calculators/A&P                          Severiano Utsey presents with right-sided mid back pain worse at night.  No concerning symptoms of any neurologic or intra-abdominal process.  A CT stone study was ordered in triage, but the patient has no urologic symptoms.  As such, the CT scan was negative.  I obtained a chest x-ray to screen for any intrathoracic process that could be giving him his posterior chest wall pain.  A thoracic spine series did reveal significant arthritis, and this is the most likely etiology of his symptoms based on his pain.  I have recommended  ongoing use of symptomatic management including Tylenol and topical treatment.  No narcotics are indicated.    Final Clinical Impression(s) / ED Diagnoses Final diagnoses:  Thoracic spondylosis    Rx / DC Orders ED Discharge Orders    None       Koleen Distance, MD 10/04/20 1246

## 2020-10-04 NOTE — ED Triage Notes (Signed)
Pt seen at [facility] yesterday for non-traumatic back pain x 1 week.  Taking pain medication since yesterday but states he is still having throbbing pain.

## 2020-10-13 ENCOUNTER — Other Ambulatory Visit: Payer: Self-pay

## 2020-10-13 MED FILL — Lisinopril & Hydrochlorothiazide Tab 10-12.5 MG: ORAL | 30 days supply | Qty: 30 | Fill #0 | Status: AC

## 2020-10-21 ENCOUNTER — Other Ambulatory Visit: Payer: Self-pay

## 2020-10-21 ENCOUNTER — Ambulatory Visit: Payer: Self-pay | Admitting: Critical Care Medicine

## 2020-10-21 VITALS — BP 130/84 | HR 73 | Ht 68.0 in | Wt 225.0 lb

## 2020-10-21 DIAGNOSIS — L608 Other nail disorders: Secondary | ICD-10-CM

## 2020-10-21 DIAGNOSIS — B351 Tinea unguium: Secondary | ICD-10-CM

## 2020-10-21 MED ORDER — CICLOPIROX 8 % EX SOLN
Freq: Every day | CUTANEOUS | 0 refills | Status: DC
  Filled 2020-10-21: qty 6.6, 25d supply, fill #0

## 2020-10-21 NOTE — Assessment & Plan Note (Signed)
Onychomycosis of all toenails of both feet for this we will prescribe Penlac topical toenail paint to be used daily for 7 days

## 2020-10-21 NOTE — Progress Notes (Signed)
Subjective:    Patient ID: [name] [name], male    DOB: 1980-07-26, 40 y.o.   MRN: [id number]  This is a 40 year old male primary care patient of Dr. 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  Blood pressure on arrival is good 130/84.  Patient has a prior history of hypertension hemorrhoids hyperlipidemia.  Note he was recently seen in the office by Dr. Alvis Lemmings and ordered a CT renal stone study which was negative because he was having right flank pain.  He states that flank pain is now gone   Past Medical History:  Diagnosis Date  . Allergy   . Chronic kidney disease   . Hyperlipidemia   . Hypertension      Family History  Problem Relation Age of Onset  . Hypertension Father   . Leukemia Daughter        age 62 yrs old   . Colon cancer Neg Hx   . Stomach cancer Neg Hx   . Esophageal cancer Neg Hx   . Rectal cancer Neg Hx      Social History   Socioeconomic History  . Marital status: Legally Separated    Spouse name: Not on file  . Number of children: Not on file  . Years of education: Not on file  . Highest education level: Not on file  Occupational History  . Not on file  Tobacco Use  . Smoking status: Never Smoker  . Smokeless tobacco: Never Used  Vaping Use  . Vaping Use: Never used  Substance and Sexual Activity  . Alcohol use: Yes    Alcohol/week:  8.0 - 10.0 standard drinks    Types: 8 - 10 Cans of beer per week    Comment: occ   . Drug use: No  . Sexual activity: Yes    Partners: Female  Other Topics Concern  . Not on file  Social History Narrative  . Not on file   Social Determinants of Health   Financial Resource Strain: Not on file  Food Insecurity: Not on file  Transportation Needs: Not on file  Physical Activity: Not on file  Stress: Not on file  Social Connections: Not on file  Intimate Partner Violence: Not on file     Allergies  Allergen Reactions  . Doxycycline Rash     Outpatient Medications Prior to Visit  Medication Sig Dispense Refill  . atorvastatin (LIPITOR) 20 MG tablet TAKE 1 TABLET (20 MG TOTAL) BY MOUTH DAILY. 90 tablet 2  . clotrimazole (LOTRIMIN) 1 % cream Apply 1 application topically 2 (two) times daily. 30 g 0  . fluticasone (FLONASE) 50 MCG/ACT nasal spray Place 1 spray into both nostrils daily. 1 g 0  . lisinopril-hydrochlorothiazide (ZESTORETIC) 10-12.5 MG tablet TAKE 1 TABLET BY MOUTH DAILY. 90 tablet 1  . naproxen (NAPROSYN) 500 MG tablet Take 1 tablet (500 mg  total) by mouth 2 (two) times daily with a meal. 30 tablet 0  . Omega-3 Fatty Acids (FISH OIL) 500 MG CAPS Take 1 each by mouth daily.    . Psyllium (METAMUCIL PO) Take by mouth as needed.    Marland Kitchen tiZANidine (ZANAFLEX) 4 MG tablet Take 1 tablet (4 mg total) by mouth every 8 (eight) hours as needed. 30 tablet 0  . Vitamin D, Ergocalciferol, (DRISDOL) 1.25 MG (50000 UNIT) CAPS capsule Take 1 capsule (50,000 Units total) by mouth every 7 (seven) days. 4 capsule 2  . 0.9 %  sodium chloride infusion      No facility-administered medications prior to visit.    Review of Systems  Constitutional: Negative.   HENT: Negative.   Eyes: Negative.   Respiratory: Negative.   Cardiovascular: Negative.   Gastrointestinal: Negative.   Endocrine: Negative.   Genitourinary: Negative.   Skin:       Linear white lines on fingernails  Toenail  thickening  Neurological: Negative.   Hematological: Negative.   Psychiatric/Behavioral: Negative.        Objective:   Physical Exam Vitals:   10/21/20 1614  BP: 130/84  Pulse: 73  SpO2: 98%  Weight: 225 lb (102.1 kg)  Height: 5\' 8"  (1.727 m)    Gen: Pleasant, well-nourished, in no distress,  normal affect  ENT: No lesions,  mouth clear,  oropharynx clear, no postnasal drip  Neck: No JVD, no TMG, no carotid bruits  Lungs: No use of accessory muscles, no dullness to percussion, clear without rales or rhonchi  Cardiovascular: RRR, heart sounds normal, no murmur or gallops, no peripheral edema  Abdomen: soft and NT, no HSM,  BS normal  Musculoskeletal: No deformities, no cyanosis or clubbing  Neuro: alert, non focal  Skin: There are linear lines on the hands compatible with leuko-nychia There is overt toenail onychomycosis seen in all 5 toes of both feet       Assessment & Plan:  I personally reviewed all images and lab data in the [facility] system as well as any outside material available during this office visit and agree with the  radiology impressions.   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  Diagnoses and all orders for this visit:  Leukonychia  Onychomycosis of toenail  Other orders -      ciclopirox (PENLAC) 8 % solution; Apply topically at bedtime. Aplicar sobre la ua y la piel circundante. Aplicar diariamente sobre la capa anterior. Despus de siete (7) das, podr Lynne Logan con alcohol y Oceanographer.

## 2020-10-21 NOTE — Assessment & Plan Note (Signed)
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

## 2020-10-21 NOTE — Progress Notes (Signed)
Nail and toe fungus.

## 2020-10-21 NOTE — Patient Instructions (Signed)
  For the toenails: Apply nail paint twice a day to all toenails   For the hands:     Keeping nails trimmed short ?Avoiding trauma  ?Avoiding contact irritants  ?Keeping nails dry (avoiding wet work) ?Avoiding all nail cosmetics  ?Protecting hands from cold or windy weather  Use a good skin moisturizer on the hands   Para las uas de los pies: Aplique pintura de uas dos veces al da en todas las uas de los pies.   Para las manos:  [name] las uas cortas Evitar traumas Evitar los irritantes de Astronomer las uas secas (evitar el trabajo mojado) Evitar todos los cosmticos para las uas Proteger las manos del clima fro o ventoso  Use una buena crema hidratante para la piel en las manos.

## 2020-10-22 ENCOUNTER — Other Ambulatory Visit: Payer: Self-pay

## 2020-10-22 MED FILL — Atorvastatin Calcium Tab 20 MG (Base Equivalent): ORAL | 30 days supply | Qty: 30 | Fill #0 | Status: AC

## 2020-11-13 ENCOUNTER — Other Ambulatory Visit: Payer: Self-pay

## 2020-11-13 MED FILL — Lisinopril & Hydrochlorothiazide Tab 10-12.5 MG: ORAL | 30 days supply | Qty: 30 | Fill #1 | Status: AC

## 2020-11-23 MED FILL — Atorvastatin Calcium Tab 20 MG (Base Equivalent): ORAL | 30 days supply | Qty: 30 | Fill #1 | Status: AC

## 2020-11-24 ENCOUNTER — Other Ambulatory Visit: Payer: Self-pay

## 2020-12-01 ENCOUNTER — Ambulatory Visit: Payer: Self-pay | Admitting: Family Medicine

## 2020-12-16 ENCOUNTER — Other Ambulatory Visit: Payer: Self-pay

## 2020-12-16 ENCOUNTER — Other Ambulatory Visit: Payer: Self-pay | Admitting: Family Medicine

## 2020-12-16 ENCOUNTER — Telehealth: Payer: Self-pay | Admitting: Family Medicine

## 2020-12-16 DIAGNOSIS — E7800 Pure hypercholesterolemia, unspecified: Secondary | ICD-10-CM

## 2020-12-16 DIAGNOSIS — I10 Essential (primary) hypertension: Secondary | ICD-10-CM

## 2020-12-16 MED ORDER — ATORVASTATIN CALCIUM 20 MG PO TABS
ORAL_TABLET | Freq: Every day | ORAL | 0 refills | Status: DC
  Filled 2020-12-16 – 2020-12-23 (×2): qty 30, 30d supply, fill #0

## 2020-12-16 MED ORDER — LISINOPRIL-HYDROCHLOROTHIAZIDE 10-12.5 MG PO TABS
1.0000 | ORAL_TABLET | Freq: Every day | ORAL | 0 refills | Status: DC
  Filled 2020-12-16: qty 30, 30d supply, fill #0

## 2020-12-16 NOTE — Telephone Encounter (Signed)
  Notes to clinic: Patient has upcoming appt on 12/24/2020 Review for 90 day supply    Requested Prescriptions  Pending Prescriptions Disp Refills   lisinopril-hydrochlorothiazide (ZESTORETIC) 10-12.5 MG tablet 90 tablet 1    Sig: TAKE 1 TABLET BY MOUTH DAILY.      Cardiovascular:  ACEI + Diuretic Combos Failed - 12/16/2020  9:51 AM      Failed - Valid encounter within last 6 months    Recent Outpatient Visits           9 months ago Pure hypercholesterolemia   [place] [address] [facility] [place], [place], [place]   1 year ago Vitamin D deficiency   [facility] [place], [place], [place]   2 years ago Radiation protection practitioner   [facility] [place], Odette Horns, MD   2 years ago Balanitis   [facility] [place], Odette Horns, MD   3 years ago Radiation protection practitioner   [facility] [place], Odette Horns, MD       Future Appointments             In 1 week [place], Marzella [place]lein, [place] [facility]             Passed - Na in normal range and within 180 days    Sodium  Date Value Ref Range Status  10/04/2020 137 135 - 145 mmol/L Final  04/13/2020 140 134 - 144 mmol/L Final          Passed - K in normal range and within 180 days    Potassium  Date Value Ref Range Status  10/04/2020 4.2 3.5 - 5.1 mmol/L Final          Passed - Cr in normal range and within 180 days    Creat  Date Value Ref Range Status  03/28/2016 0.75 0.60 - 1.35 mg/dL Final   Creatinine, Ser  Date Value Ref Range Status  10/04/2020 0.79 0.61 - 1.24 mg/dL Final          Passed - Ca in normal range and within 180 days    Calcium  Date Value Ref Range Status  10/04/2020 9.4 8.9 - 10.3 mg/dL Final   Calcium, Ion  Date Value Ref Range Status  10/21/2016 1.14 (L) 1.15 - 1.40 mmol/L Final          Passed - Patient is not pregnant      Passed - Last BP in normal range    BP Readings  from Last 1 Encounters:  10/21/20 130/84

## 2020-12-16 NOTE — Telephone Encounter (Signed)
Pt requesting refills of Atorvastatin and Lisinopril and if they could be sent to [facility]. Pt has upcoming appt but needs it before then. Thank you

## 2020-12-16 NOTE — Telephone Encounter (Signed)
Refills have been sent to last until his appointment

## 2020-12-16 NOTE — Telephone Encounter (Signed)
Pt has not had any lab work done has upcoming appointment with Marylene Land on 12/24/20

## 2020-12-21 ENCOUNTER — Other Ambulatory Visit: Payer: Self-pay

## 2020-12-24 ENCOUNTER — Ambulatory Visit: Payer: Self-pay | Attending: Family Medicine | Admitting: Physician Assistant

## 2020-12-24 ENCOUNTER — Encounter: Payer: Self-pay | Admitting: Physician Assistant

## 2020-12-24 ENCOUNTER — Other Ambulatory Visit: Payer: Self-pay

## 2020-12-24 VITALS — BP 105/56 | HR 67 | Ht 68.0 in | Wt 220.8 lb

## 2020-12-24 DIAGNOSIS — L608 Other nail disorders: Secondary | ICD-10-CM

## 2020-12-24 DIAGNOSIS — I10 Essential (primary) hypertension: Secondary | ICD-10-CM

## 2020-12-24 DIAGNOSIS — Z789 Other specified health status: Secondary | ICD-10-CM

## 2020-12-24 DIAGNOSIS — B351 Tinea unguium: Secondary | ICD-10-CM

## 2020-12-24 DIAGNOSIS — E559 Vitamin D deficiency, unspecified: Secondary | ICD-10-CM

## 2020-12-24 DIAGNOSIS — R739 Hyperglycemia, unspecified: Secondary | ICD-10-CM

## 2020-12-24 DIAGNOSIS — E7800 Pure hypercholesterolemia, unspecified: Secondary | ICD-10-CM

## 2020-12-24 MED ORDER — LISINOPRIL-HYDROCHLOROTHIAZIDE 10-12.5 MG PO TABS
1.0000 | ORAL_TABLET | Freq: Every day | ORAL | 3 refills | Status: DC
  Filled 2020-12-24 – 2021-01-15 (×2): qty 30, 30d supply, fill #0
  Filled 2021-02-11: qty 30, 30d supply, fill #1
  Filled 2021-03-18: qty 30, 30d supply, fill #2

## 2020-12-24 MED ORDER — TERBINAFINE HCL 250 MG PO TABS
250.0000 mg | ORAL_TABLET | Freq: Every day | ORAL | 0 refills | Status: DC
  Filled 2020-12-24: qty 30, 30d supply, fill #0
  Filled 2021-01-25: qty 30, 30d supply, fill #1
  Filled 2021-02-24: qty 30, 30d supply, fill #2

## 2020-12-24 MED ORDER — ATORVASTATIN CALCIUM 20 MG PO TABS
ORAL_TABLET | Freq: Every day | ORAL | 3 refills | Status: DC
  Filled 2020-12-24: qty 30, fill #0
  Filled 2020-12-24: qty 30, 30d supply, fill #0
  Filled 2021-01-25: qty 30, 30d supply, fill #1
  Filled 2021-02-24: qty 30, 30d supply, fill #2
  Filled 2021-04-06: qty 30, 30d supply, fill #3

## 2020-12-24 NOTE — Patient Instructions (Signed)
Infeccin por hongos en las uas Fungal Nail Infection La infeccin por hongos en las uas es una infeccin frecuente de las uas de los pies o de las manos. Este trastorno [employer] uas de los pies con ms frecuencia que las uas de las manos. Generalmente afecta al dedo gordo del pie. Ms de Neomia Dear ua puede infectarse. Esta afeccin puede transmitirse de [place] persona a otra (es contagiosa). Cules son las causas? La causa de esta afeccin es un hongo. Son varios los tipos de hongos que pueden causar la infeccin. Estos hongos son frecuentes en las zonas hmedas y clidas. Si las manos o los pies entran en contacto con los hongos, se pueden introducir en una ruptura de las uas de las manos o de los pies y IT consultant. Qu incrementa el riesgo? Los siguientes factores pueden hacer que usted sea ms propenso a Aeronautical engineer afeccin: Ser hombre. Ser Neomia Dear persona de edad avanzada. Convivir con alguien que tiene hongos. Caminar descalzo en zonas donde proliferan hongos, como duchas o vestuarios. Usar zapatos y calcetines que [employer]. Tener una ua lastimada o haberse sometido a una ciruga de uas recientemente. Tener ciertas afecciones, por ejemplo: Pie de atleta. Diabetes. Psoriasis. Mala circulacin. Debilitamiento del sistema de defensa del organismo (sistema inmunitario). Cules son los signos o los sntomas? Los sntomas de esta afeccin incluyen: Una mancha plida sobre la ua. Engrosamiento de la ua. Una ua que se torna amarilla o [place]. Bordes de las uas rugosos o quebradizos. Una ua que se cae. Una ua que se ha desprendido del lecho ungueal. Cmo se diagnostica? Esta afeccin se diagnostica mediante un examen fsico. El mdico podr tomaruna muestra de la ua para examinarla y Engineer, manufacturing si tiene hongos. Cmo se trata? No es necesario realizar tratamiento si la infeccin es leve. Si tiene [employer] uas, el tratamiento puede  incluir lo siguiente: Antimicticos que se toman por boca (va oral). Deber tomar los medicamentos durante algunas semanas o meses y no ver los resultados hasta despus de un [place]. Estos medicamentos pueden tener efectos secundarios. Consulte al [employer] a los que debe estar atento. Cremas o esmaltes de uas antimicticos. Se pueden usar junto con los medicamentos antimicticos que se administran por va oral. Tratamiento lser de las uas. Ciruga para extirpar la ua. Esto puede ser [employer] casos ms graves de infecciones. La infeccin puede tardar un largo tiempo en desaparecer, habitualmente hastaun ao. Adems, la infeccin puede regresar. Siga estas indicaciones en su casa: Medicamentos [place] o aplquese los medicamentos de venta libre y los recetados solamente como se lo haya indicado el mdico. Consulte al mdico sobre el uso de pomadas mentoladas para las uas de [place]. Cuidado de las uas Crtese las uas con Psychologist, clinical. Lvese y squese las manos y los pies todos [place]. Mantenga los pies secos: Use calcetines absorbentes y cmbiese los calcetines con frecuencia. Use un tipo de calzado que permita que el aire Muir, como sandalias o zapatillas de lona. Deseche los zapatos viejos. No use uas artificiales. Si va al saln de esttica de uas, asegrese de elegir uno en el que se usen instrumentos limpios. Aplquese polvo antimictico en los pies y en los zapatos. Indicaciones generales No comparta elementos personales como toallas o cortauas. No camine descalzo en duchas o vestuarios. Use guantes de goma si est trabajando con sus manos en lugares mojados. Concurra a todas las visitas de control como se lo haya indicado  el mdico. Esto es importante. Comunquese con un mdico si: La infeccin no mejora o si empeora despus de varios meses. Resumen La infeccin por hongos en las uas es una infeccin frecuente de las uas de los pies o  de las manos. No es necesario realizar tratamiento si la infeccin es leve. Si tiene [employer] uas, el tratamiento puede incluir la administracin de medicamentos por va oral y la aplicacin de un medicamento en las uas. La infeccin puede tardar un largo tiempo en desaparecer, habitualmente hasta un ao. Adems, la infeccin puede regresar. Tome o aplquese los medicamentos de venta libre y los recetados solamente como se lo haya indicado el mdico. Siga las instrucciones de cuidado de las uas a fin de ayudar a Automotive engineer que la infeccin regrese o se extienda. Esta informacin no tiene Theme park manager el consejo del mdico. Asegresede hacerle al mdico cualquier pregunta que tenga. Document Revised: 12/28/2017 Document Reviewed: 12/28/2017 Elsevier Patient Education  2022 [employer].

## 2020-12-24 NOTE — Progress Notes (Signed)
Patient ID: Devlon Dosher, male   DOB: 09-Sep-1980, 40 y.o.   MRN: [id number]   Schneur Crowson, is a 40 y.o. male  YYT:[id number]  EXN:[id number]  DOB - 1980/08/11  Subjective:  Chief Complaint and HPI: Jasraj Lappe is a 40 y.o. male here today to establish care and for a follow up visit.   ED/Hospital notes reviewed.   Social History: Family history:  ROS:   Constitutional:  No f/c, No night sweats, No unexplained weight loss. EENT:  No vision changes, No blurry vision, No hearing changes. No mouth, throat, or ear problems.  Respiratory: No cough, No SOB Cardiac: No CP, no palpitations GI:  No abd pain, No N/V/D. GU: No Urinary s/sx Musculoskeletal: No joint pain Neuro: No headache, no dizziness, no motor weakness.  Skin: No rash Endocrine:  No polydipsia. No polyuria.  Psych: Denies SI/HI  No problems updated.  ALLERGIES: Allergies  Allergen Reactions   Doxycycline Rash    PAST MEDICAL HISTORY: Past Medical History:  Diagnosis Date   Allergy    Chronic kidney disease    Hyperlipidemia    Hypertension     MEDICATIONS AT HOME: Prior to Admission medications   Medication Sig Start Date End Date Taking? Authorizing Provider  ciclopirox (PENLAC) 8 % solution Apply topically at bedtime. Aplicar sobre la ua y la piel circundante. Aplicar diariamente sobre la capa anterior. Despus de siete (7) das, podr Oceanographer con alcohol y Surveyor, quantity. 10/21/20  Yes Storm Frisk, MD  clotrimazole (LOTRIMIN) 1 % cream Apply 1 application topically 2 (two) times daily. 03/15/18  Yes [place], Odette Horns, MD  fluticasone (FLONASE) 50 MCG/ACT nasal spray Place 1 spray into both nostrils daily. 02/21/20 02/20/21 Yes Elson Areas, PA-C  naproxen (NAPROSYN) 500 MG tablet Take 1 tablet (500 mg total) by mouth 2 (two) times daily with a meal. 10/03/20  Yes Wallis Bamberg, PA-C  Omega-3 Fatty Acids (FISH OIL) 500 MG CAPS Take 1 each by mouth daily.   Yes [provider]   Psyllium (METAMUCIL PO) Take by mouth as needed.   Yes [provider]  terbinafine (LAMISIL) 250 MG tablet Take 1 tablet (250 mg total) by mouth daily. 12/24/20  Yes Levent Kornegay, Marzella [place], PA-C  tiZANidine (ZANAFLEX) 4 MG tablet Take 1 tablet (4 mg total) by mouth every 8 (eight) hours as needed. 10/03/20  Yes Wallis Bamberg, PA-C  Vitamin D, Ergocalciferol, (DRISDOL) 1.25 MG (50000 UNIT) CAPS capsule Take 1 capsule (50,000 Units total) by mouth every 7 (seven) days. 10/11/19  Yes Newlin, Odette Horns, MD  atorvastatin (LIPITOR) 20 MG tablet TAKE 1 TABLET (20 MG TOTAL) BY MOUTH DAILY. 12/24/20 12/24/21  Anders Simmonds, PA-C  lisinopril-hydrochlorothiazide (ZESTORETIC) 10-12.5 MG tablet Take 1 tablet by mouth daily. 12/24/20 12/24/21  Anders Simmonds, PA-C     Objective:  EXAM:   Vitals:   12/24/20 1607  BP: (!) 105/56  Pulse: 67  SpO2: 98%  Weight: 220 lb 12.8 oz (100.2 kg)  Height: 5\' 8"  (1.727 m)    General appearance : A&OX3. NAD. Non-toxic-appearing HEENT: Atraumatic and Normocephalic.  PERRLA. EOM intact.   Chest/Lungs:  Breathing-non-labored, Good air entry bilaterally, breath sounds normal without rales, rhonchi, or wheezing  CVS: S1 S2 regular, no murmurs, gallops, rubs  Extremities: Bilateral Lower Ext shows no edema, both legs are warm to touch with = pulse throughout.  B feet/nails with onychomychosis Neurology:  CN II-XII grossly intact, Non focal.   Psych:  TP  linear. J/I WNL. Normal speech. Appropriate eye contact and affect.  Skin:  No Rash  Data Review Lab Results  Component Value Date   HGBA1C 5.8 (H) 03/17/2020   HGBA1C 5.7 06/23/2017   HGBA1C 5.4 03/29/2016     Assessment & Plan   1. Pure hypercholesterolemia - atorvastatin (LIPITOR) 20 MG tablet; TAKE 1 TABLET (20 MG TOTAL) BY MOUTH DAILY.  Dispense: 30 tablet; Refill: 3 - CBC with Differential/Platelet - Comprehensive metabolic panel  2. Essential hypertension Controlled.  Drink ample water -  lisinopril-hydrochlorothiazide (ZESTORETIC) 10-12.5 MG tablet; Take 1 tablet by mouth daily.  Dispense: 30 tablet; Refill: 3 - CBC with Differential/Platelet - Comprehensive metabolic panel  3. Leukonychia - Thyroid Panel With TSH  4. Onychomycosis of toenail LFT about 9 mths ago normal Avoid alcohol while on meds and no tylenol products - terbinafine (LAMISIL) 250 MG tablet; Take 1 tablet (250 mg total) by mouth daily.  Dispense: 90 tablet; Refill: 0 - CBC with Differential/Platelet - Comprehensive metabolic panel  5. Vitamin D deficiency - Vitamin D, 25-hydroxy  6. Hyperglycemia I have had a lengthy discussion and provided education about insulin resistance and the intake of too much sugar/refined carbohydrates.  I have advised the patient to work at a goal of eliminating sugary drinks, candy, desserts, sweets, refined sugars, processed foods, and white carbohydrates.  The patient expresses understanding.   - Hemoglobin A1c - Comprehensive metabolic panel  7. Language barrier AMN (Maria)interpreters used and additional time performing visit was required.      Patient have been counseled extensively about nutrition and exercise  Return in about 4 months (around 04/25/2021) for PCP/chronic conditions.  The patient was given clear instructions to go to ER or return to medical center if symptoms don't improve, worsen or new problems develop. The patient verbalized understanding. The patient was told to call to get lab results if they haven't heard anything in the next week.     Georgian Co, PA-C [facility] [place], [place] [phone]   12/24/2020, 4:28 PM

## 2020-12-25 LAB — CBC WITH DIFFERENTIAL/PLATELET
Basophils Absolute: 0 10*3/uL (ref 0.0–0.2)
Basos: 1 %
EOS (ABSOLUTE): 0.3 10*3/uL (ref 0.0–0.4)
Eos: 4 %
Hematocrit: 44.9 % (ref 37.5–51.0)
Hemoglobin: 15.5 g/dL (ref 13.0–17.7)
Immature Grans (Abs): 0 10*3/uL (ref 0.0–0.1)
Immature Granulocytes: 0 %
Lymphocytes Absolute: 1.9 10*3/uL (ref 0.7–3.1)
Lymphs: 28 %
MCH: 29.7 pg (ref 26.6–33.0)
MCHC: 34.5 g/dL (ref 31.5–35.7)
MCV: 86 fL (ref 79–97)
Monocytes Absolute: 0.5 10*3/uL (ref 0.1–0.9)
Monocytes: 7 %
Neutrophils Absolute: 4.3 10*3/uL (ref 1.4–7.0)
Neutrophils: 60 %
Platelets: 209 10*3/uL (ref 150–450)
RBC: 5.22 x10E6/uL (ref 4.14–5.80)
RDW: 13.3 % (ref 11.6–15.4)
WBC: 7 10*3/uL (ref 3.4–10.8)

## 2020-12-25 LAB — COMPREHENSIVE METABOLIC PANEL
ALT: 42 IU/L (ref 0–44)
AST: 37 IU/L (ref 0–40)
Albumin/Globulin Ratio: 2.1 (ref 1.2–2.2)
Albumin: 5 g/dL (ref 4.0–5.0)
Alkaline Phosphatase: 85 IU/L (ref 44–121)
BUN/Creatinine Ratio: 20 (ref 9–20)
BUN: 18 mg/dL (ref 6–20)
Bilirubin Total: 0.5 mg/dL (ref 0.0–1.2)
CO2: 19 mmol/L — ABNORMAL LOW (ref 20–29)
Calcium: 9.7 mg/dL (ref 8.7–10.2)
Chloride: 99 mmol/L (ref 96–106)
Creatinine, Ser: 0.88 mg/dL (ref 0.76–1.27)
Globulin, Total: 2.4 g/dL (ref 1.5–4.5)
Glucose: 92 mg/dL (ref 65–99)
Potassium: 3.9 mmol/L (ref 3.5–5.2)
Sodium: 137 mmol/L (ref 134–144)
Total Protein: 7.4 g/dL (ref 6.0–8.5)
eGFR: 113 mL/min/{1.73_m2} (ref >59)

## 2020-12-25 LAB — VITAMIN D 25 HYDROXY (VIT D DEFICIENCY, FRACTURES): Vit D, 25-Hydroxy: 19.7 ng/mL — ABNORMAL LOW (ref 30.0–100.0)

## 2020-12-25 LAB — THYROID PANEL WITH TSH
Free Thyroxine Index: 1.8 (ref 1.2–4.9)
T3 Uptake Ratio: 29 % (ref 24–39)
T4, Total: 6.2 ug/dL (ref 4.5–12.0)
TSH: 1.17 u[IU]/mL (ref 0.450–4.500)

## 2020-12-25 LAB — HEMOGLOBIN A1C
Est. average glucose Bld gHb Est-mCnc: 111 mg/dL
Hgb A1c MFr Bld: 5.5 % (ref 4.8–5.6)

## 2020-12-30 ENCOUNTER — Other Ambulatory Visit: Payer: Self-pay

## 2020-12-30 ENCOUNTER — Other Ambulatory Visit: Payer: Self-pay | Admitting: Physician Assistant

## 2020-12-30 MED ORDER — VITAMIN D (ERGOCALCIFEROL) 1.25 MG (50000 UNIT) PO CAPS
50000.0000 [IU] | ORAL_CAPSULE | ORAL | 2 refills | Status: DC
  Filled 2020-12-30: qty 4, 28d supply, fill #0
  Filled 2021-01-25: qty 4, 28d supply, fill #1
  Filled 2021-02-24: qty 4, 28d supply, fill #2

## 2020-12-31 ENCOUNTER — Other Ambulatory Visit: Payer: Self-pay

## 2021-01-07 ENCOUNTER — Other Ambulatory Visit: Payer: Self-pay

## 2021-01-07 ENCOUNTER — Telehealth: Payer: Self-pay | Admitting: Family Medicine

## 2021-01-07 ENCOUNTER — Ambulatory Visit: Payer: Self-pay | Attending: Family Medicine

## 2021-01-07 NOTE — Telephone Encounter (Signed)
Copied from CRM [id number]. Topic: General - Other >> Jan 07, 2021 12:14 PM Gaetana Michaelis A wrote: Reason for CRM: Patient would like to be contacted by Mr. Mikle Bosworth regarding their appointment for today 01/07/21  The patient shares that they have been required to provide 3 bank statements and are only able to provide two from Jan and Feb  The patient still plans on coming today to their appointment and will attempt to contact their bank for additional assistance with statements

## 2021-01-15 ENCOUNTER — Other Ambulatory Visit: Payer: Self-pay

## 2021-01-26 ENCOUNTER — Other Ambulatory Visit: Payer: Self-pay

## 2021-01-27 ENCOUNTER — Other Ambulatory Visit: Payer: Self-pay

## 2021-02-12 ENCOUNTER — Other Ambulatory Visit: Payer: Self-pay

## 2021-02-25 ENCOUNTER — Other Ambulatory Visit: Payer: Self-pay

## 2021-03-19 ENCOUNTER — Other Ambulatory Visit: Payer: Self-pay

## 2021-04-07 ENCOUNTER — Other Ambulatory Visit: Payer: Self-pay

## 2021-04-15 ENCOUNTER — Other Ambulatory Visit: Payer: Self-pay | Admitting: Family Medicine

## 2021-04-15 DIAGNOSIS — B351 Tinea unguium: Secondary | ICD-10-CM

## 2021-04-15 NOTE — Telephone Encounter (Signed)
Medication Refill - Medication: terbinafine (LAMISIL) 250 MG tablet   Has the patient contacted their pharmacy? Yes.   (Agent: If no, request that the patient contact the pharmacy for the refill.) (Agent: If yes, when and what did the pharmacy advise?) Contact PCP   Preferred Pharmacy (with phone number or street name):  [facility]  [address] [place] [address]  Phone: [phone] Fax: [phone]   Has the patient been seen for an appointment in the last year OR does the patient have an upcoming appointment? Yes.    Agent: Please be advised that RX refills may take up to 3 business days. We ask that you follow-up with your pharmacy.

## 2021-04-16 ENCOUNTER — Other Ambulatory Visit: Payer: Self-pay

## 2021-04-16 MED ORDER — TERBINAFINE HCL 250 MG PO TABS
250.0000 mg | ORAL_TABLET | Freq: Every day | ORAL | 0 refills | Status: DC
  Filled 2021-04-16: qty 30, 30d supply, fill #0

## 2021-04-16 NOTE — Telephone Encounter (Signed)
Requested medication (s) are due for refill today - yes  Requested medication (s) are on the active medication list -yes  Future visit scheduled -yes  Last refill: 12/24/20 #90  Notes to clinic: Request RF: medication not assigned protocol  Requested Prescriptions  Pending Prescriptions Disp Refills   terbinafine (LAMISIL) 250 MG tablet 90 tablet 0    Sig: Take 1 tablet (250 mg total) by mouth daily.     Off-Protocol Failed - 04/15/2021  5:07 PM      Failed - Medication not assigned to a protocol, review manually.      Passed - Valid encounter within last 12 months    Recent Outpatient Visits           3 months ago Pure hypercholesterolemia   [facility] [place], [place], [place]   1 year ago Pure hypercholesterolemia   [place] [address] [facility] [place], Marzella Schlein, [place]   1 year ago Vitamin D deficiency   [place] [place], Odette Horns, [place]   2 years ago Radiation protection practitioner   [facility] Hoy Register, [place]   3 years ago Balanitis   [facility] [place], Odette Horns, MD       Future Appointments             In 1 week Hoy Register, MD [facility]               Requested Prescriptions  Pending Prescriptions Disp Refills   terbinafine (LAMISIL) 250 MG tablet 90 tablet 0    Sig: Take 1 tablet (250 mg total) by mouth daily.     Off-Protocol Failed - 04/15/2021  5:07 PM      Failed - Medication not assigned to a protocol, review manually.      Passed - Valid encounter within last 12 months    Recent Outpatient Visits           3 months ago Pure hypercholesterolemia   [facility] [place], [place], [place]   1 year ago Pure hypercholesterolemia   [place] [address] [facility] [place], Marzella Schlein, [place]   1 year ago Vitamin D deficiency    [facility] [place], Odette Horns, MD   2 years ago Radiation protection practitioner   [facility] Hoy Register, MD   3 years ago Balanitis   [facility] Hoy Register, MD       Future Appointments             In 1 week Hoy Register, MD [facility]ness

## 2021-04-19 ENCOUNTER — Other Ambulatory Visit: Payer: Self-pay

## 2021-04-26 ENCOUNTER — Other Ambulatory Visit: Payer: Self-pay

## 2021-04-26 ENCOUNTER — Ambulatory Visit: Payer: Self-pay | Attending: Family Medicine | Admitting: Family Medicine

## 2021-04-26 ENCOUNTER — Encounter: Payer: Self-pay | Admitting: Family Medicine

## 2021-04-26 VITALS — BP 119/63 | HR 73 | Ht 68.0 in | Wt 218.0 lb

## 2021-04-26 DIAGNOSIS — Z23 Encounter for immunization: Secondary | ICD-10-CM

## 2021-04-26 DIAGNOSIS — E559 Vitamin D deficiency, unspecified: Secondary | ICD-10-CM

## 2021-04-26 DIAGNOSIS — I10 Essential (primary) hypertension: Secondary | ICD-10-CM

## 2021-04-26 DIAGNOSIS — Z1159 Encounter for screening for other viral diseases: Secondary | ICD-10-CM

## 2021-04-26 DIAGNOSIS — E7800 Pure hypercholesterolemia, unspecified: Secondary | ICD-10-CM

## 2021-04-26 MED ORDER — ATORVASTATIN CALCIUM 20 MG PO TABS
ORAL_TABLET | Freq: Every day | ORAL | 6 refills | Status: DC
  Filled 2021-04-26: qty 30, fill #0
  Filled 2021-05-07: qty 30, 30d supply, fill #0

## 2021-04-26 MED ORDER — LISINOPRIL-HYDROCHLOROTHIAZIDE 10-12.5 MG PO TABS
1.0000 | ORAL_TABLET | Freq: Every day | ORAL | 6 refills | Status: DC
  Filled 2021-04-26: qty 30, 30d supply, fill #0
  Filled 2021-05-26: qty 30, 30d supply, fill #1
  Filled 2021-06-30: qty 30, 30d supply, fill #2
  Filled 2021-07-28: qty 30, 30d supply, fill #3
  Filled 2021-07-29: qty 30, 30d supply, fill #0
  Filled 2021-08-31: qty 30, 30d supply, fill #1
  Filled 2021-09-30: qty 30, 30d supply, fill #2
  Filled 2021-11-02: qty 30, 30d supply, fill #3

## 2021-04-26 NOTE — Progress Notes (Signed)
 Subjective:  Patient ID: [name] [name], male    DOB: 07/08/1980  Age: 40 y.o. MRN: [id number]  CC: Hypertension   HPI [name] [name] is a 40 y.o. year old male with a history of hypercholesterolemia, hypertension who presents today for chronic disease management.  Interval History: Doing well on his antihypertensive and his statin and denies adverse effects from his medications.  He has completed his course of Drisdol for vitamin D deficiency. Also taking an oral antifungal for tinea pedis. Denies acute concerns today.  Past Medical History:  Diagnosis Date   Allergy    Chronic kidney disease    Hyperlipidemia    Hypertension     Past Surgical History:  Procedure Laterality Date   CYSTOSCOPY W/ RETROGRADES Left 07/08/2016   Procedure: CYSTOSCOPY WITH RETROGRADE PYELOGRAM AND STENT PLACEMENT;  Surgeon: Theodore Manny, MD;  Location: [facility];  Service: Urology;  Laterality: Left;   NO PAST SURGERIES     ROBOT ASSISTED PYELOPLASTY Left 07/08/2016   Procedure: XI ROBOTIC ASSISTED PYELOPLASTY;  Surgeon: Theodore Manny, MD;  Location: [facility];  Service: Urology;  Laterality: Left;    Family History  Problem Relation Age of Onset   Hypertension Father    Leukemia Daughter        age 8 yrs old    Colon cancer Neg Hx    Stomach cancer Neg Hx    Esophageal cancer Neg Hx    Rectal cancer Neg Hx     Allergies  Allergen Reactions   Doxycycline Rash    Outpatient Medications Prior to Visit  Medication Sig Dispense Refill   ciclopirox (PENLAC) 8 % solution Apply topically at bedtime. Aplicar sobre la ua y la piel circundante. Aplicar diariamente sobre la capa anterior. Despus de siete (7) das, podr retirar con alcohol y continuar el ciclo. 6.6 mL 0   clotrimazole (LOTRIMIN) 1 % cream Apply 1 application topically 2 (two) times daily. 30 g 0   naproxen (NAPROSYN) 500 MG tablet Take 1 tablet (500 mg total) by mouth 2 (two) times daily with a meal. 30 tablet 0   Omega-3 Fatty  Acids (FISH OIL) 500 MG CAPS Take 1 each by mouth daily.     Psyllium (METAMUCIL PO) Take by mouth as needed.     terbinafine (LAMISIL) 250 MG tablet Take 1 tablet (250 mg total) by mouth daily. 30 tablet 0   tiZANidine (ZANAFLEX) 4 MG tablet Take 1 tablet (4 mg total) by mouth every 8 (eight) hours as needed. 30 tablet 0   Vitamin D, Ergocalciferol, (DRISDOL) 1.25 MG (50000 UNIT) CAPS capsule Take 1 capsule (50,000 Units total) by mouth every 7 (seven) days. 4 capsule 2   atorvastatin (LIPITOR) 20 MG tablet TAKE 1 TABLET (20 MG TOTAL) BY MOUTH DAILY. 30 tablet 3   lisinopril-hydrochlorothiazide (ZESTORETIC) 10-12.5 MG tablet Take 1 tablet by mouth daily. 30 tablet 3   fluticasone (FLONASE) 50 MCG/ACT nasal spray Place 1 spray into both nostrils daily. 1 g 0   No facility-administered medications prior to visit.     ROS Review of Systems  Constitutional:  Negative for activity change and appetite change.  HENT:  Negative for sinus pressure and sore throat.   Eyes:  Negative for visual disturbance.  Respiratory:  Negative for cough, chest tightness and shortness of breath.   Cardiovascular:  Negative for chest pain and leg swelling.  Gastrointestinal:  Negative for abdominal distention, abdominal pain, constipation and diarrhea.  Endocrine: Negative.     Genitourinary:  Negative for dysuria.  Musculoskeletal:  Negative for joint swelling and myalgias.  Skin:  Negative for rash.  Allergic/Immunologic: Negative.   Neurological:  Negative for weakness, light-headedness and numbness.  Psychiatric/Behavioral:  Negative for dysphoric mood and suicidal ideas.    Objective:  BP 119/63   Pulse 73   Ht 5' 8" (1.727 m)   Wt 218 lb (98.9 kg)   SpO2 98%   BMI 33.15 kg/m   BP/Weight 04/26/2021 12/24/2020 10/21/2020  Systolic BP 119 105 130  Diastolic BP 63 56 84  Wt. (Lbs) 218 220.8 225  BMI 33.15 33.57 34.21      Physical Exam Constitutional:      Appearance: He is well-developed.   Cardiovascular:     Rate and Rhythm: Normal rate.     Heart sounds: Normal heart sounds. No murmur heard. Pulmonary:     Effort: Pulmonary effort is normal.     Breath sounds: Normal breath sounds. No wheezing or rales.  Chest:     Chest wall: No tenderness.  Abdominal:     General: Bowel sounds are normal. There is no distension.     Palpations: Abdomen is soft. There is no mass.     Tenderness: There is no abdominal tenderness.  Musculoskeletal:        General: Normal range of motion.     Right lower leg: No edema.     Left lower leg: No edema.  Neurological:     Mental Status: He is alert and oriented to person, place, and time.  Psychiatric:        Mood and Affect: Mood normal.    CMP Latest Ref Rng & Units 12/24/2020 10/04/2020 04/13/2020  Glucose 65 - 99 mg/dL 92 128(H) 88  BUN 6 - 20 mg/dL 18 15 17  Creatinine 0.76 - 1.27 mg/dL 0.88 0.79 0.75(L)  Sodium 134 - 144 mmol/L 137 137 140  Potassium 3.5 - 5.2 mmol/L 3.9 4.2 4.1  Chloride 96 - 106 mmol/L 99 106 100  CO2 20 - 29 mmol/L 19(L) 25 25  Calcium 8.7 - 10.2 mg/dL 9.7 9.4 9.5  Total Protein 6.0 - 8.5 g/dL 7.4 - 7.7  Total Bilirubin 0.0 - 1.2 mg/dL 0.5 - 0.4  Alkaline Phos 44 - 121 IU/L 85 - 98  AST 0 - 40 IU/L 37 - 39  ALT 0 - 44 IU/L 42 - 55(H)    Lipid Panel     Component Value Date/Time   CHOL 210 (H) 03/17/2020 0846   TRIG 330 (H) 03/17/2020 0846   HDL 58 03/17/2020 0846   CHOLHDL 3.6 03/17/2020 0846   CHOLHDL 4.3 03/28/2016 1229   VLDL 66 (H) 03/28/2016 1229   LDLCALC 97 03/17/2020 0846    CBC    Component Value Date/Time   WBC 7.0 12/24/2020 1627   WBC 5.3 10/04/2020 1029   RBC 5.22 12/24/2020 1627   RBC 5.47 10/04/2020 1029   HGB 15.5 12/24/2020 1627   HCT 44.9 12/24/2020 1627   PLT 209 12/24/2020 1627   MCV 86 12/24/2020 1627   MCH 29.7 12/24/2020 1627   MCH 29.6 10/04/2020 1029   MCHC 34.5 12/24/2020 1627   MCHC 32.5 10/04/2020 1029   RDW 13.3 12/24/2020 1627   LYMPHSABS 1.9 12/24/2020  1627   MONOABS 0.3 10/04/2020 1029   EOSABS 0.3 12/24/2020 1627   BASOSABS 0.0 12/24/2020 1627    Lab Results  Component Value Date   HGBA1C 5.5 12/24/2020      Assessment & Plan:  1. Vitamin D deficiency Last vitamin D level was 19.7 in 12/2020 Completed course of Drisdol Will repeat level - VITAMIN D 25 Hydroxy (Vit-D Deficiency, Fractures); Future  2. Essential hypertension Controlled Continue current regimen Counseled on blood pressure goal of less than 130/80, low-sodium, DASH diet, medication compliance, 150 minutes of moderate intensity exercise per week. Discussed medication compliance, adverse effects. - lisinopril-hydrochlorothiazide (ZESTORETIC) 10-12.5 MG tablet; Take 1 tablet by mouth daily.  Dispense: 30 tablet; Refill: 6 - CMP14+EGFR; Future  3. Pure hypercholesterolemia Last cholesterol from 03/2020 was slightly elevated with elevated triglycerides We will check lipid panel Continue low-cholesterol diet - atorvastatin (LIPITOR) 20 MG tablet; TAKE 1 TABLET (20 MG TOTAL) BY MOUTH DAILY.  Dispense: 30 tablet; Refill: 6 - LP+Non-HDL Cholesterol; Future  4. Need for immunization against influenza - Flu Vaccine QUAD 22moIM (Fluarix, Fluzone & Alfiuria Quad PF)  5. Screening for viral disease - HCV Ab w Reflex to Quant PCR    Meds ordered this encounter  Medications   lisinopril-hydrochlorothiazide (ZESTORETIC) 10-12.5 MG tablet    Sig: Take 1 tablet by mouth daily.    Dispense:  30 tablet    Refill:  6   atorvastatin (LIPITOR) 20 MG tablet    Sig: TAKE 1 TABLET (20 MG TOTAL) BY MOUTH DAILY.    Dispense:  30 tablet    Refill:  6    Follow-up: Return in about 6 months (around 10/25/2021) for medical conditions.       ECharlott Rakes MD, FAAFP. [facility] [place] [facility]  04/26/2021, 4:56 PM

## 2021-04-26 NOTE — Patient Instructions (Signed)
Plan de alimentación DASH °DASH Eating Plan °DASH es la sigla en inglés de “Enfoques Alimentarios para Detener la Hipertensión”. El plan de alimentación DASH ha demostrado: °Bajar la presión arterial elevada (hipertensión). °Reducir el riesgo de diabetes tipo 2, enfermedad cardíaca y accidente cerebrovascular. °Ayudar a perder peso. °Consejos para seguir este plan °Leer las etiquetas de los alimentos °Verifique la cantidad de sal (sodio) por porción en las etiquetas de los alimentos. Elija alimentos con menos del 5 por ciento del valor diario de sodio. Generalmente, los alimentos con menos de 300 miligramos (mg) de sodio por porción se encuadran dentro de este plan alimentario. °Para encontrar cereales integrales, busque la palabra "integral" como primera palabra en la lista de ingredientes. °Al ir de compras °Compre productos en los que en su etiqueta diga: “bajo contenido de sodio” o “sin agregado de sal”. °Compre alimentos frescos. Evite los alimentos enlatados y comidas precocidas o congeladas. °Al cocinar °Evite agregar sal cuando cocine. Use hierbas o aderezos sin sal, en lugar de sal de mesa o sal marina. Consulte al médico o farmacéutico antes de usar sustitutos de la sal. °No fría los alimentos. A la hora de cocinar los alimentos opte por hornearlos, hervirlos, grillarlos, asarlos al horno y asarlos a la parrilla. °Cocine con aceites cardiosaludables, como oliva, canola, aguacate, soja o girasol. °Planificación de las comidas ° °Consuma una dieta equilibrada, que incluya lo siguiente: °4 o más porciones de frutas y 4 o más porciones de verduras por día. Trate de que medio plato de cada comida sea de frutas y verduras. °De 6 a 8 porciones de cereales integrales todos los días. °Menos de 6 onzas (170 g) de carne, aves o pescado magros por día. Una porción de 3 onzas (85 g) de carne tiene casi el mismo tamaño que un mazo de cartas. Un huevo equivale a 1 onza (28 g). °De 2 a 3 porciones de productos lácteos  descremados por día. Una porción es 1 taza (237 ml). °1 porción de frutos secos, semillas o frijoles 5 veces por semana. °De 2 a 3 porciones de grasas cardiosaludables. Las grasas saludables llamadas ácidos grasos omega-3 se encuentran en alimentos como las nueces, las semillas de lino, las leches fortificadas y los huevos. Estas grasas también se encuentran en los pescados de agua fría, como la sardina, el salmón y la caballa. °Limite la cantidad que consume de: °Alimentos enlatados o envasados. °Alimentos con alto contenido de grasa trans, como algunos alimentos fritos. °Alimentos con alto contenido de grasa saturada, como carne con grasa. °Postres y otros dulces, bebidas azucaradas y otros alimentos con azúcar agregada. °Productos lácteos enteros. °No le agregue sal a los alimentos antes de probarlos. °No coma más de 4 yemas de huevo por semana. °Trate de comer al menos 2 comidas vegetarianas por semana. °Consuma más comida casera y menos de restaurante, de bares y comida rápida. °Estilo de vida °Cuando coma en un restaurante, pida que preparen su comida con menos sal o, en lo posible, sin nada de sal. °Si bebe alcohol: °Limite la cantidad que bebe: °De 0 a 1 medida por día para las mujeres que no están embarazadas. °De 0 a 2 medidas por día para los hombres. °Esté atento a la cantidad de alcohol que hay en las bebidas que toma. En los [place], una medida equivale a una botella de cerveza de 12 oz (355 ml), un vaso de vino de 5 oz (148 ml) o un vaso de una bebida alcohólica de alta graduación de 1½ oz (  44 ml). °Información general °Evite ingerir más de 2300 mg de sal por día. Si tiene hipertensión, es posible que necesite reducir la ingesta de sodio a 1,500 mg por día. °Trabaje con su médico para mantener un peso saludable o perder peso. Pregúntele cuál es el peso recomendado para usted. °Realice al menos 30 minutos de ejercicio que haga que se acelere su corazón (ejercicio aeróbico) la mayoría de los días  de la semana. Estas actividades pueden incluir caminar, nadar o andar en bicicleta. °Trabaje con su médico o nutricionista para ajustar su plan alimentario a sus necesidades calóricas personales. °¿Qué alimentos debo comer? °Frutas °Todas las frutas frescas, congeladas o disecadas. Frutas enlatadas en jugo natural (sin agregado de azúcar). °Verduras °Verduras frescas o congeladas (crudas, al vapor, asadas o grilladas). Jugos de tomate y verduras con bajo contenido de sodio o reducidos en sodio. Salsa y pasta de tomate con bajo contenido de sodio o reducidas en sodio. Verduras enlatadas con bajo contenido de sodio o reducidas en sodio. °Granos °Pan de salvado o integral. Pasta de salvado o integral. Arroz integral. Avena. Quinua. Trigo burgol. Cereales integrales y con bajo contenido de sodio. Pan pita. Galletitas de agua con bajo contenido de grasa y sodio. Tortillas de harina integral. °Carnes y otras proteínas °Pollo o pavo sin piel. Carne de pollo o de pavo molida. Cerdo desgrasado. Pescado y mariscos. Claras de huevo. Porotos, guisantes o lentejas secos. Frutos secos, mantequilla de frutos secos y semillas sin sal. Frijoles enlatados sin sal. Cortes de carne vacuna magra, desgrasada. Carne precocida o curada magra y baja en sodio, como embutidos o panes de carne. °Lácteos °Leche descremada (1 %) o descremada. Quesos reducidos en grasa, con bajo contenido de grasa o descremados. Queso blanco o ricota sin grasa, con bajo contenido de sodio. Yogur semidescremado o descremado. Queso con bajo contenido de grasa y sodio. °Grasas y aceites °Margarinas untables que no contengan grasas trans. Aceite vegetal. Mayonesa y aderezos para ensaladas livianos, reducidos en grasa o con bajo contenido de grasas (reducidos en sodio). Aceite de canola, cártamo, oliva, aguacate, soja y girasol. Aguacate. °Aliños y condimentos °Hierbas. Especias. Mezclas de condimentos sin sal. °Otros alimentos °Palomitas de maíz y pretzels sin sal.  Dulces con bajo contenido de grasas. °Es posible que los productos que se enumeran más arriba no constituyan una lista completa de los alimentos y las bebidas que puede tomar. Consulte a un nutricionista para obtener más información. °¿Qué alimentos debo evitar? °Frutas °Fruta enlatada en almíbar liviano o espeso. Frutas cocidas en aceite. Frutas con salsa de crema o mantequilla. °Verduras °Verduras con crema o fritas. Verduras en salsa de queso. Verduras enlatadas regulares (que no sean con bajo contenido de sodio o reducidas en sodio). Pasta y salsa de tomates enlatadas regulares (que no sean con bajo contenido de sodio o reducidas en sodio). Jugos de tomate y verduras regulares (que no sean con bajo contenido de sodio o reducidos en sodio). Pepinillos. Aceitunas. °Granos °Productos de panificación hechos con grasa, como medialunas, magdalenas y algunos panes. Comidas con arroz o pasta seca listas para usar. °Carnes y otras proteínas °Cortes de carne con alto contenido de grasa. Costillas. Carne frita. Tocino. Mortadela, salame y otras carnes precocidas o curadas, como embutidos o panes de carne. Grasa de la espalda del cerdo (panceta). Salchicha de cerdo. Frutos secos y semillas con sal. Frijoles enlatados con agregado de sal. Pescado enlatado o ahumado. Huevos enteros o yemas. Pollo o pavo con piel. °Lácteos °Leche entera o al 2 %, crema   y mitad leche y mitad crema. Queso crema entero o con toda su grasa. Yogur entero o endulzado. Quesos con toda su grasa. Sustitutos de cremas no lácteas. Coberturas batidas. Quesos para untar y quesos procesados. °Grasas y aceites °Mantequilla. Margarina en barra. Manteca de cerdo. Lardo. Mantequilla clarificada. Grasa de panceta. Aceites tropicales como aceite de coco, palmiste o palma. °Aliños y condimentos °Sal de cebolla, sal de ajo, sal condimentada, sal de mesa y sal marina. Salsa [place]. Salsa tártara. Salsa barbacoa. Salsa teriyaki. Salsa de soja, incluso la que  tiene contenido reducido de sodio. Salsa de carne. Salsas en lata y envasadas. Salsa de pescado. Salsa de ostras. Salsa rosada. Rábanos picantes comprados en tiendas. [place]up. Mostaza. Saborizantes y tiernizantes para carne. Caldo en cubitos. Salsas picantes. Adobos preelaborados o envasados. Aderezos para tacos preelaborados o envasados. Salsas de pepinillos. Aderezos comunes para ensalada. °Otros alimentos °Palomitas de maíz y pretzels con sal. °Es posible que los productos que se enumeran más arriba no constituyan una lista completa de los alimentos y las bebidas que debe evitar. Consulte a un nutricionista para obtener más información. °Dónde buscar más información °National Heart, Lung, and Blood Institute (Instituto Nacional del Cor[place], los Pulmones y la Sangre): www.nhlbi.nih.gov °American Heart Association (Asociación Estadounidense del [place]): www.heart.org °Academy of Nutrition and Dietetics (Academia de Nutrición y Dietética): www.eatright.org °National Kidney Foundation (Fundación Nacional del [place]): www.kidney.org °Resumen °El plan de alimentación DASH ha demostrado bajar la presión arterial elevada (hipertensión). También puede reducir el riesgo de diabetes tipo 2, enfermedad cardíaca y accidente cerebrovascular. °Cuando siga el plan de alimentación DASH, trate de comer más frutas frescas y verduras, cereales integrales, carnes magras, lácteos descremados y grasas cardiosaludables. °Con el plan de alimentación DASH, deberá limitar el consumo de sal (sodio) a 2,300 mg por día. Si tiene hipertensión, es posible que necesite reducir la ingesta de sodio a 1,500 mg por día. °Trabaje con su médico o nutricionista para ajustar su plan alimentario a sus necesidades calóricas personales. °Esta información no tiene como fin reemplazar el consejo del médico. Asegúrese de hacerle al médico cualquier pregunta que tenga. °Document Revised: 07/25/2019 Document Reviewed: 07/25/2019 °Elsevier Patient Education ©  2022 Elsevier Inc. ° °

## 2021-04-27 ENCOUNTER — Other Ambulatory Visit: Payer: Self-pay

## 2021-04-28 ENCOUNTER — Other Ambulatory Visit: Payer: Self-pay

## 2021-05-07 ENCOUNTER — Other Ambulatory Visit: Payer: Self-pay

## 2021-05-07 ENCOUNTER — Ambulatory Visit: Payer: Self-pay | Attending: Family Medicine

## 2021-05-07 DIAGNOSIS — I10 Essential (primary) hypertension: Secondary | ICD-10-CM

## 2021-05-07 DIAGNOSIS — Z1159 Encounter for screening for other viral diseases: Secondary | ICD-10-CM

## 2021-05-07 DIAGNOSIS — E559 Vitamin D deficiency, unspecified: Secondary | ICD-10-CM

## 2021-05-07 DIAGNOSIS — E7800 Pure hypercholesterolemia, unspecified: Secondary | ICD-10-CM

## 2021-05-08 LAB — CMP14+EGFR
ALT: 32 IU/L (ref 0–44)
AST: 29 IU/L (ref 0–40)
Albumin/Globulin Ratio: 1.9 (ref 1.2–2.2)
Albumin: 4.9 g/dL (ref 4.0–5.0)
Alkaline Phosphatase: 84 IU/L (ref 44–121)
BUN/Creatinine Ratio: 16 (ref 9–20)
BUN: 12 mg/dL (ref 6–20)
Bilirubin Total: 0.8 mg/dL (ref 0.0–1.2)
CO2: 23 mmol/L (ref 20–29)
Calcium: 9.5 mg/dL (ref 8.7–10.2)
Chloride: 103 mmol/L (ref 96–106)
Creatinine, Ser: 0.77 mg/dL (ref 0.76–1.27)
Globulin, Total: 2.6 g/dL (ref 1.5–4.5)
Glucose: 90 mg/dL (ref 70–99)
Potassium: 4.3 mmol/L (ref 3.5–5.2)
Sodium: 140 mmol/L (ref 134–144)
Total Protein: 7.5 g/dL (ref 6.0–8.5)
eGFR: 117 mL/min/{1.73_m2} (ref >59)

## 2021-05-08 LAB — HCV AB W REFLEX TO QUANT PCR: HCV Ab: <0.1 s/co ratio (ref 0.0–0.9)

## 2021-05-08 LAB — LP+NON-HDL CHOLESTEROL
Cholesterol, Total: 225 mg/dL — ABNORMAL HIGH (ref 100–199)
HDL: 65 mg/dL (ref >39)
LDL Chol Calc (NIH): 133 mg/dL — ABNORMAL HIGH (ref 0–99)
Total Non-HDL-Chol (LDL+VLDL): 160 mg/dL — ABNORMAL HIGH (ref 0–129)
Triglycerides: 156 mg/dL — ABNORMAL HIGH (ref 0–149)
VLDL Cholesterol Cal: 27 mg/dL (ref 5–40)

## 2021-05-08 LAB — VITAMIN D 25 HYDROXY (VIT D DEFICIENCY, FRACTURES): Vit D, 25-Hydroxy: 26.2 ng/mL — ABNORMAL LOW (ref 30.0–100.0)

## 2021-05-08 LAB — HCV INTERPRETATION

## 2021-05-10 ENCOUNTER — Other Ambulatory Visit: Payer: Self-pay

## 2021-05-10 ENCOUNTER — Other Ambulatory Visit: Payer: Self-pay | Admitting: Family Medicine

## 2021-05-10 DIAGNOSIS — E7800 Pure hypercholesterolemia, unspecified: Secondary | ICD-10-CM

## 2021-05-10 MED ORDER — ATORVASTATIN CALCIUM 40 MG PO TABS
40.0000 mg | ORAL_TABLET | Freq: Every day | ORAL | 6 refills | Status: DC
  Filled 2021-05-10 – 2021-07-09 (×2): qty 30, 30d supply, fill #0
  Filled 2021-07-09 – 2021-08-13 (×2): qty 30, 30d supply, fill #1
  Filled 2021-09-14: qty 30, 30d supply, fill #2
  Filled 2021-10-14: qty 30, 30d supply, fill #3
  Filled 2021-11-17: qty 30, 30d supply, fill #4
  Filled 2021-12-20: qty 30, 30d supply, fill #5

## 2021-05-10 MED ORDER — VITAMIN D (ERGOCALCIFEROL) 1.25 MG (50000 UNIT) PO CAPS
50000.0000 [IU] | ORAL_CAPSULE | ORAL | 1 refills | Status: DC
  Filled 2021-05-10 – 2021-12-06 (×2): qty 12, 84d supply, fill #0

## 2021-05-12 ENCOUNTER — Other Ambulatory Visit: Payer: Self-pay

## 2021-05-14 ENCOUNTER — Telehealth: Payer: Self-pay

## 2021-05-14 NOTE — Telephone Encounter (Signed)
Patient name and DOB has been verified Patient was informed of lab results. Patient had no questions.  

## 2021-05-14 NOTE — Telephone Encounter (Signed)
-----   Message from Hoy Register, MD sent at 05/10/2021  1:01 PM EST ----- Please inform him that cholesterol is elevated and I have sent an increased dose of atorvastatin to his pharmacy.  Vitamin D is still low but has improved compared to previous labs.  I have sent a refill of Drisdol to his pharmacy.

## 2021-05-26 ENCOUNTER — Other Ambulatory Visit: Payer: Self-pay

## 2021-06-04 ENCOUNTER — Other Ambulatory Visit: Payer: Self-pay

## 2021-06-30 ENCOUNTER — Other Ambulatory Visit: Payer: Self-pay

## 2021-07-09 ENCOUNTER — Other Ambulatory Visit (HOSPITAL_COMMUNITY): Payer: Self-pay

## 2021-07-09 ENCOUNTER — Other Ambulatory Visit: Payer: Self-pay

## 2021-07-10 ENCOUNTER — Other Ambulatory Visit: Payer: Self-pay

## 2021-07-12 ENCOUNTER — Other Ambulatory Visit: Payer: Self-pay

## 2021-07-29 ENCOUNTER — Other Ambulatory Visit: Payer: Self-pay

## 2021-07-30 ENCOUNTER — Other Ambulatory Visit: Payer: Self-pay

## 2021-08-13 ENCOUNTER — Other Ambulatory Visit: Payer: Self-pay

## 2021-09-01 ENCOUNTER — Other Ambulatory Visit: Payer: Self-pay

## 2021-09-15 ENCOUNTER — Other Ambulatory Visit: Payer: Self-pay

## 2021-10-01 ENCOUNTER — Other Ambulatory Visit: Payer: Self-pay

## 2021-10-15 ENCOUNTER — Other Ambulatory Visit: Payer: Self-pay

## 2021-10-25 ENCOUNTER — Ambulatory Visit: Payer: Self-pay | Admitting: Family Medicine

## 2021-11-02 ENCOUNTER — Other Ambulatory Visit: Payer: Self-pay

## 2021-11-03 ENCOUNTER — Other Ambulatory Visit: Payer: Self-pay

## 2021-11-17 ENCOUNTER — Ambulatory Visit: Payer: Self-pay | Admitting: Physician Assistant

## 2021-11-18 ENCOUNTER — Other Ambulatory Visit: Payer: Self-pay

## 2021-12-06 ENCOUNTER — Other Ambulatory Visit: Payer: Self-pay

## 2021-12-06 ENCOUNTER — Other Ambulatory Visit: Payer: Self-pay | Admitting: Family Medicine

## 2021-12-06 DIAGNOSIS — I10 Essential (primary) hypertension: Secondary | ICD-10-CM

## 2021-12-06 MED ORDER — LISINOPRIL-HYDROCHLOROTHIAZIDE 10-12.5 MG PO TABS
1.0000 | ORAL_TABLET | Freq: Every day | ORAL | 3 refills | Status: DC
  Filled 2021-12-06: qty 30, 30d supply, fill #0
  Filled 2022-01-06: qty 30, 30d supply, fill #1
  Filled 2022-02-09: qty 30, 30d supply, fill #2
  Filled 2022-03-15: qty 30, 30d supply, fill #3

## 2021-12-07 ENCOUNTER — Other Ambulatory Visit: Payer: Self-pay

## 2021-12-21 ENCOUNTER — Other Ambulatory Visit: Payer: Self-pay

## 2022-01-07 ENCOUNTER — Other Ambulatory Visit: Payer: Self-pay

## 2022-01-24 ENCOUNTER — Other Ambulatory Visit: Payer: Self-pay

## 2022-01-24 ENCOUNTER — Other Ambulatory Visit: Payer: Self-pay | Admitting: Family Medicine

## 2022-01-24 DIAGNOSIS — E7800 Pure hypercholesterolemia, unspecified: Secondary | ICD-10-CM

## 2022-01-24 MED ORDER — ATORVASTATIN CALCIUM 40 MG PO TABS
40.0000 mg | ORAL_TABLET | Freq: Every day | ORAL | 0 refills | Status: DC
  Filled 2022-01-24: qty 30, 30d supply, fill #0

## 2022-01-25 ENCOUNTER — Other Ambulatory Visit: Payer: Self-pay

## 2022-02-09 ENCOUNTER — Other Ambulatory Visit: Payer: Self-pay

## 2022-03-02 ENCOUNTER — Other Ambulatory Visit: Payer: Self-pay | Admitting: Family Medicine

## 2022-03-02 DIAGNOSIS — E7800 Pure hypercholesterolemia, unspecified: Secondary | ICD-10-CM

## 2022-03-03 ENCOUNTER — Other Ambulatory Visit: Payer: Self-pay

## 2022-03-03 MED ORDER — ATORVASTATIN CALCIUM 40 MG PO TABS
40.0000 mg | ORAL_TABLET | Freq: Every day | ORAL | 0 refills | Status: DC
  Filled 2022-03-03: qty 30, 30d supply, fill #0

## 2022-03-04 ENCOUNTER — Ambulatory Visit (INDEPENDENT_AMBULATORY_CARE_PROVIDER_SITE_OTHER): Payer: Self-pay

## 2022-03-04 ENCOUNTER — Ambulatory Visit (HOSPITAL_COMMUNITY)
Admission: EM | Admit: 2022-03-04 | Discharge: 2022-03-04 | Disposition: A | Discharge status: Home / Self Care | Payer: Self-pay | Attending: Physician Assistant | Admitting: Physician Assistant

## 2022-03-04 ENCOUNTER — Encounter (HOSPITAL_COMMUNITY): Payer: Self-pay | Admitting: Emergency Medicine

## 2022-03-04 ENCOUNTER — Other Ambulatory Visit: Payer: Self-pay

## 2022-03-04 DIAGNOSIS — M79645 Pain in left finger(s): Secondary | ICD-10-CM

## 2022-03-04 DIAGNOSIS — S66311A Strain of extensor muscle, fascia and tendon of left index finger at wrist and hand level, initial encounter: Secondary | ICD-10-CM

## 2022-03-04 DIAGNOSIS — S60022A Contusion of left index finger without damage to nail, initial encounter: Secondary | ICD-10-CM

## 2022-03-04 MED ORDER — PREDNISONE 10 MG PO TABS
10.0000 mg | ORAL_TABLET | Freq: Three times a day (TID) | ORAL | 0 refills | Status: DC
  Filled 2022-03-04: qty 10, 4d supply, fill #0

## 2022-03-04 MED ORDER — IBUPROFEN 600 MG PO TABS
600.0000 mg | ORAL_TABLET | Freq: Three times a day (TID) | ORAL | 0 refills | Status: DC
  Filled 2022-03-04: qty 30, 10d supply, fill #0

## 2022-03-04 NOTE — Discharge Instructions (Addendum)
Advised take ibuprofen 600 mg every 8 hours with food to help reduce the swelling of the finger. Advised to use Epsom salt soaks 3-4 times throughout the day for the next couple weeks to help reduce the swelling of the finger. Advised to follow-up PCP or return to urgent care if symptoms fail to improve.
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
## 2022-03-10 ENCOUNTER — Other Ambulatory Visit: Payer: Self-pay

## 2022-03-15 ENCOUNTER — Ambulatory Visit: Payer: Self-pay | Attending: Family Medicine | Admitting: Family Medicine

## 2022-03-15 ENCOUNTER — Encounter: Payer: Self-pay | Admitting: Family Medicine

## 2022-03-15 ENCOUNTER — Other Ambulatory Visit: Payer: Self-pay

## 2022-03-15 VITALS — BP 134/76 | HR 70 | Temp 98.2°F | Ht 68.0 in | Wt 213.6 lb

## 2022-03-15 DIAGNOSIS — E7800 Pure hypercholesterolemia, unspecified: Secondary | ICD-10-CM

## 2022-03-15 DIAGNOSIS — E559 Vitamin D deficiency, unspecified: Secondary | ICD-10-CM

## 2022-03-15 DIAGNOSIS — I10 Essential (primary) hypertension: Secondary | ICD-10-CM

## 2022-03-15 DIAGNOSIS — Z23 Encounter for immunization: Secondary | ICD-10-CM

## 2022-03-15 DIAGNOSIS — Z131 Encounter for screening for diabetes mellitus: Secondary | ICD-10-CM

## 2022-03-15 NOTE — Progress Notes (Signed)
Discuss hair loss. 

## 2022-03-15 NOTE — Progress Notes (Signed)
Subjective:  Patient ID: [name] [name], male    DOB: 28-Mar-1981  Age: 41 y.o. MRN: [id number]  CC: Hypertension   HPI [name] [name] is a 41 y.o. year old male with a history of hypercholesterolemia, hypertension who presents today for chronic disease management.  Interval History: He endorses adherence with his antihypertensive and his statin.  His major form of exercise is walking and running. Endorses tolerating his medications with no adverse effects. He completed a course of Drisdol for vitamin D deficiency. Denies additional concerns today.  Past Medical History:  Diagnosis Date   Allergy    Chronic kidney disease    Hyperlipidemia    Hypertension     Past Surgical History:  Procedure Laterality Date   CYSTOSCOPY W/ RETROGRADES Left 07/08/2016   Procedure: CYSTOSCOPY WITH RETROGRADE PYELOGRAM AND STENT PLACEMENT;  Surgeon: Alexis Frock, MD;  Location[facility];  Service: Urology;  Laterality: Left;   NO PAST SURGERIES     ROBOT ASSISTED PYELOPLASTY Left 07/08/2016   Procedure: XI ROBOTIC ASSISTED PYELOPLASTY;  Surgeon: Alexis Frock, MD;  Location[facility];  Service: Urology;  Laterality: Left;    Family History  Problem Relation Age of Onset   Hypertension Father    Leukemia Daughter        age 8 yrs old    Colon cancer Neg Hx    Stomach cancer Neg Hx    Esophageal cancer Neg Hx    Rectal cancer Neg Hx     Social History   Socioeconomic History   Marital status: Legally Separated    Spouse name: Not on file   Number of children: Not on file   Years of education: Not on file   Highest education level: Not on file  Occupational History   Not on file  Tobacco Use   Smoking status: Never   Smokeless tobacco: Never  Vaping Use   Vaping Use: Never used  Substance and Sexual Activity   Alcohol use: Yes    Alcohol/week: 8.0 - 10.0 standard drinks of alcohol    Types: 8 - 10 Cans of beer per week    Comment: occ    Drug use: No   Sexual activity: Yes     Partners: Female  Other Topics Concern   Not on file  Social History Narrative   Not on file   Social Determinants of Health   Financial Resource Strain: Not on file  Food Insecurity: Not on file  Transportation Needs: Not on file  Physical Activity: Not on file  Stress: Not on file  Social Connections: Not on file    Allergies  Allergen Reactions   Doxycycline Rash    Outpatient Medications Prior to Visit  Medication Sig Dispense Refill   atorvastatin (LIPITOR) 40 MG tablet Take 1 tablet (40 mg total) by mouth daily. 30 tablet 0   ciclopirox (PENLAC) 8 % solution Apply topically at bedtime. Aplicar sobre la ua y la piel circundante. Aplicar diariamente sobre la capa anterior. Despus de siete (7) das, podr Charity fundraiser con alcohol y Geophysical data processor. 6.6 mL 0   clotrimazole (LOTRIMIN) 1 % cream Apply 1 application topically 2 (two) times daily. 30 g 0   lisinopril-hydrochlorothiazide (ZESTORETIC) 10-12.5 MG tablet Take 1 tablet by mouth daily. 30 tablet 3   Omega-3 Fatty Acids (FISH OIL) 500 MG CAPS Take 1 each by mouth daily.     Psyllium (METAMUCIL PO) Take by mouth as needed.     terbinafine (  LAMISIL) 250 MG tablet Take 1 tablet (250 mg total) by mouth daily. 30 tablet 0   Vitamin D, Ergocalciferol, (DRISDOL) 1.25 MG (50000 UNIT) CAPS capsule Take 1 capsule (50,000 Units total) by mouth every 7 (seven) days. 12 capsule 1   ibuprofen (ADVIL) 600 MG tablet Take 1 tablet (600 mg total) by mouth 3 (three) times daily. 30 tablet 0   naproxen (NAPROSYN) 500 MG tablet Take 1 tablet (500 mg total) by mouth 2 (two) times daily with a meal. 30 tablet 0   predniSONE (DELTASONE) 10 MG tablet Take 1 tablet (10 mg total) by mouth 3 (three) times daily. 10 tablet 0   tiZANidine (ZANAFLEX) 4 MG tablet Take 1 tablet (4 mg total) by mouth every 8 (eight) hours as needed. 30 tablet 0   fluticasone (FLONASE) 50 MCG/ACT nasal spray Place 1 spray into both nostrils daily. 1 g 0   No  facility-administered medications prior to visit.     ROS Review of Systems  Constitutional:  Negative for activity change and appetite change.  HENT:  Negative for sinus pressure and sore throat.   Respiratory:  Negative for chest tightness, shortness of breath and wheezing.   Cardiovascular:  Negative for chest pain and palpitations.  Gastrointestinal:  Negative for abdominal distention, abdominal pain and constipation.  Genitourinary: Negative.   Musculoskeletal: Negative.   Psychiatric/Behavioral:  Negative for behavioral problems and dysphoric mood.     Objective:  BP 134/76   Pulse 70   Temp 98.2 F (36.8 C) (Oral)   Ht '5\' 8"'  (1.727 m)   Wt 213 lb 9.6 oz (96.9 kg)   SpO2 97%   BMI 32.48 kg/m      03/15/2022    4:02 PM 03/04/2022    3:36 PM 04/26/2021    4:15 PM  BP/Weight  Systolic BP 973 532 992  Diastolic BP 76 71 63  Wt. (Lbs) 213.6  218  BMI 32.48 kg/m2  33.15 kg/m2      Physical Exam Constitutional:      Appearance: He is well-developed.  Cardiovascular:     Rate and Rhythm: Normal rate.     Heart sounds: Normal heart sounds. No murmur heard. Pulmonary:     Effort: Pulmonary effort is normal.     Breath sounds: Normal breath sounds. No wheezing or rales.  Chest:     Chest wall: No tenderness.  Abdominal:     General: Bowel sounds are normal. There is no distension.     Palpations: Abdomen is soft. There is no mass.     Tenderness: There is no abdominal tenderness.  Musculoskeletal:        General: Normal range of motion.     Right lower leg: No edema.     Left lower leg: No edema.  Neurological:     Mental Status: He is alert and oriented to person, place, and time.  Psychiatric:        Mood and Affect: Mood normal.        Latest Ref Rng & Units 05/07/2021    1:58 PM 12/24/2020    4:27 PM 10/04/2020   10:29 AM  CMP  Glucose 70 - 99 mg/dL 90  92  128   BUN 6 - 20 mg/dL '12  18  15   ' Creatinine 0.76 - 1.27 mg/dL 0.77  0.88  0.79   Sodium  134 - 144 mmol/L 140  137  137   Potassium 3.5 - 5.2 mmol/L 4.3  3.9  4.2   Chloride 96 - 106 mmol/L 103  99  106   CO2 20 - 29 mmol/L '23  19  25   ' Calcium 8.7 - 10.2 mg/dL 9.5  9.7  9.4   Total Protein 6.0 - 8.5 g/dL 7.5  7.4    Total Bilirubin 0.0 - 1.2 mg/dL 0.8  0.5    Alkaline Phos 44 - 121 IU/L 84  85    AST 0 - 40 IU/L 29  37    ALT 0 - 44 IU/L 32  42      Lipid Panel     Component Value Date/Time   CHOL 225 (H) 05/07/2021 1358   TRIG 156 (H) 05/07/2021 1358   HDL 65 05/07/2021 1358   CHOLHDL 3.6 03/17/2020 0846   CHOLHDL 4.3 03/28/2016 1229   VLDL 66 (H) 03/28/2016 1229   LDLCALC 133 (H) 05/07/2021 1358    CBC    Component Value Date/Time   WBC 7.0 12/24/2020 1627   WBC 5.3 10/04/2020 1029   RBC 5.22 12/24/2020 1627   RBC 5.47 10/04/2020 1029   HGB 15.5 12/24/2020 1627   HCT 44.9 12/24/2020 1627   PLT 209 12/24/2020 1627   MCV 86 12/24/2020 1627   MCH 29.7 12/24/2020 1627   MCH 29.6 10/04/2020 1029   MCHC 34.5 12/24/2020 1627   MCHC 32.5 10/04/2020 1029   RDW 13.3 12/24/2020 1627   LYMPHSABS 1.9 12/24/2020 1627   MONOABS 0.3 10/04/2020 1029   EOSABS 0.3 12/24/2020 1627   BASOSABS 0.0 12/24/2020 1627    Lab Results  Component Value Date   HGBA1C 5.5 12/24/2020    Lab Results  Component Value Date   TSH 1.170 12/24/2020    Assessment & Plan:  1. Vitamin D deficiency Completed course of Drisdol We will check vitamin D levels again - VITAMIN D 25 Hydroxy (Vit-D Deficiency, Fractures); Future  2. Essential hypertension Controlled Continue current regimen Counseled on blood pressure goal of less than 130/80, low-sodium, DASH diet, medication compliance, 150 minutes of moderate intensity exercise per week. Discussed medication compliance, adverse effects. - CMP14+EGFR; Future  3. Pure hypercholesterolemia We will check lipid panel and adjust regimen accordingly Continue statin Low-cholesterol diet - LP+Non-HDL Cholesterol; Future  4.  Screening for diabetes mellitus - Hemoglobin A1c; Future    No orders of the defined types were placed in this encounter.   Follow-up: Return in about 6 months (around 09/13/2022).       Charlott Rakes, MD, FAAFP. [facility] [place], [place]   03/15/2022, 4:32 PM

## 2022-03-15 NOTE — Patient Instructions (Signed)

## 2022-03-25 ENCOUNTER — Ambulatory Visit: Payer: Self-pay | Attending: Family Medicine

## 2022-03-25 DIAGNOSIS — I10 Essential (primary) hypertension: Secondary | ICD-10-CM

## 2022-03-25 DIAGNOSIS — E559 Vitamin D deficiency, unspecified: Secondary | ICD-10-CM

## 2022-03-25 DIAGNOSIS — E7800 Pure hypercholesterolemia, unspecified: Secondary | ICD-10-CM

## 2022-03-25 DIAGNOSIS — Z131 Encounter for screening for diabetes mellitus: Secondary | ICD-10-CM

## 2022-03-26 LAB — LP+NON-HDL CHOLESTEROL
Cholesterol, Total: 207 mg/dL — ABNORMAL HIGH (ref 100–199)
HDL: 85 mg/dL (ref >39)
LDL Chol Calc (NIH): 97 mg/dL (ref 0–99)
Total Non-HDL-Chol (LDL+VLDL): 122 mg/dL (ref 0–129)
Triglycerides: 150 mg/dL — ABNORMAL HIGH (ref 0–149)
VLDL Cholesterol Cal: 25 mg/dL (ref 5–40)

## 2022-03-26 LAB — HEMOGLOBIN A1C
Est. average glucose Bld gHb Est-mCnc: 120 mg/dL
Hgb A1c MFr Bld: 5.8 % — ABNORMAL HIGH (ref 4.8–5.6)

## 2022-03-26 LAB — CMP14+EGFR
ALT: 55 IU/L — ABNORMAL HIGH (ref 0–44)
AST: 59 IU/L — ABNORMAL HIGH (ref 0–40)
Albumin/Globulin Ratio: 2 (ref 1.2–2.2)
Albumin: 4.9 g/dL (ref 4.1–5.1)
Alkaline Phosphatase: 81 IU/L (ref 44–121)
BUN/Creatinine Ratio: 19 (ref 9–20)
BUN: 15 mg/dL (ref 6–24)
Bilirubin Total: 1 mg/dL (ref 0.0–1.2)
CO2: 20 mmol/L (ref 20–29)
Calcium: 9.1 mg/dL (ref 8.7–10.2)
Chloride: 103 mmol/L (ref 96–106)
Creatinine, Ser: 0.79 mg/dL (ref 0.76–1.27)
Globulin, Total: 2.4 g/dL (ref 1.5–4.5)
Glucose: 95 mg/dL (ref 70–99)
Potassium: 4.6 mmol/L (ref 3.5–5.2)
Sodium: 138 mmol/L (ref 134–144)
Total Protein: 7.3 g/dL (ref 6.0–8.5)
eGFR: 115 mL/min/{1.73_m2} (ref >59)

## 2022-03-26 LAB — VITAMIN D 25 HYDROXY (VIT D DEFICIENCY, FRACTURES): Vit D, 25-Hydroxy: 34.2 ng/mL (ref 30.0–100.0)

## 2022-04-20 ENCOUNTER — Other Ambulatory Visit: Payer: Self-pay | Admitting: Family Medicine

## 2022-04-20 ENCOUNTER — Other Ambulatory Visit: Payer: Self-pay

## 2022-04-20 DIAGNOSIS — E7800 Pure hypercholesterolemia, unspecified: Secondary | ICD-10-CM

## 2022-04-20 MED ORDER — ATORVASTATIN CALCIUM 40 MG PO TABS
40.0000 mg | ORAL_TABLET | Freq: Every day | ORAL | 3 refills | Status: DC
  Filled 2022-04-20: qty 30, 30d supply, fill #0
  Filled 2022-05-26: qty 30, 30d supply, fill #1
  Filled 2022-07-08: qty 30, 30d supply, fill #2
  Filled 2022-08-23: qty 30, 30d supply, fill #3

## 2022-04-29 ENCOUNTER — Other Ambulatory Visit: Payer: Self-pay | Admitting: Family Medicine

## 2022-04-29 ENCOUNTER — Other Ambulatory Visit: Payer: Self-pay

## 2022-04-29 DIAGNOSIS — I10 Essential (primary) hypertension: Secondary | ICD-10-CM

## 2022-04-29 MED ORDER — LISINOPRIL-HYDROCHLOROTHIAZIDE 10-12.5 MG PO TABS
1.0000 | ORAL_TABLET | Freq: Every day | ORAL | 3 refills | Status: DC
  Filled 2022-04-29: qty 30, 30d supply, fill #0
  Filled 2022-06-03: qty 30, 30d supply, fill #1
  Filled 2022-07-15 (×2): qty 30, 30d supply, fill #2
  Filled 2022-08-30: qty 30, 30d supply, fill #3

## 2022-05-27 ENCOUNTER — Other Ambulatory Visit: Payer: Self-pay

## 2022-06-03 ENCOUNTER — Other Ambulatory Visit: Payer: Self-pay

## 2022-07-08 ENCOUNTER — Other Ambulatory Visit: Payer: Self-pay

## 2022-07-15 ENCOUNTER — Other Ambulatory Visit: Payer: Self-pay

## 2022-08-24 ENCOUNTER — Other Ambulatory Visit: Payer: Self-pay

## 2022-08-31 ENCOUNTER — Other Ambulatory Visit: Payer: Self-pay

## 2022-09-13 ENCOUNTER — Ambulatory Visit: Payer: Self-pay | Admitting: Family Medicine

## 2022-09-30 ENCOUNTER — Other Ambulatory Visit: Payer: Self-pay

## 2022-09-30 ENCOUNTER — Other Ambulatory Visit: Payer: Self-pay | Admitting: Family Medicine

## 2022-09-30 DIAGNOSIS — E7800 Pure hypercholesterolemia, unspecified: Secondary | ICD-10-CM

## 2022-09-30 DIAGNOSIS — I10 Essential (primary) hypertension: Secondary | ICD-10-CM

## 2022-09-30 MED ORDER — ATORVASTATIN CALCIUM 40 MG PO TABS
40.0000 mg | ORAL_TABLET | Freq: Every day | ORAL | 3 refills | Status: DC
  Filled 2022-09-30: qty 30, 30d supply, fill #0
  Filled 2022-11-11: qty 30, 30d supply, fill #1
  Filled 2022-12-23: qty 30, 30d supply, fill #2
  Filled 2023-02-17: qty 30, 30d supply, fill #3

## 2022-09-30 MED ORDER — LISINOPRIL-HYDROCHLOROTHIAZIDE 10-12.5 MG PO TABS
1.0000 | ORAL_TABLET | Freq: Every day | ORAL | 0 refills | Status: DC
  Filled 2022-09-30: qty 30, 30d supply, fill #0

## 2022-09-30 NOTE — Telephone Encounter (Signed)
Interpreter: [phone] Courtesy RF granted- patient is hesitant to make appointment because he does not have insurance.Will send message to office for assistance  Requested Prescriptions  Pending Prescriptions Disp Refills   atorvastatin (LIPITOR) 40 MG tablet 30 tablet 3    Sig: Take 1 tablet (40 mg total) by mouth daily.     Cardiovascular:  Antilipid - Statins Failed - 09/30/2022  1:34 PM      Failed - Lipid Panel in normal range within the last 12 months    Cholesterol, Total  Date Value Ref Range Status  03/25/2022 207 (H) 100 - 199 mg/dL Final   LDL Chol Calc (NIH)  Date Value Ref Range Status  03/25/2022 97 0 - 99 mg/dL Final   HDL  Date Value Ref Range Status  03/25/2022 85 >39 mg/dL Final   Triglycerides  Date Value Ref Range Status  03/25/2022 150 (H) 0 - 149 mg/dL Final         Passed - Patient is not pregnant      Passed - Valid encounter within last 12 months    Recent Outpatient Visits           6 months ago Vitamin D deficiency   [place], Charlane Ferretti, MD   1 year ago Vitamin D deficiency   [place], [place], MD   1 year ago Pure hypercholesterolemia   [place], [place], [place]   2 years ago Pure hypercholesterolemia   [place], [place]   3 years ago Vitamin D deficiency   [place], [place], MD               lisinopril-hydrochlorothiazide (ZESTORETIC) 10-12.5 MG tablet 30 tablet 0    Sig: Take 1 tablet by mouth daily.     Cardiovascular:  ACEI + Diuretic Combos Failed - 09/30/2022  1:34 PM      Failed - Na in normal range and within 180 days    Sodium  Date Value Ref Range Status  03/25/2022 138 134 - 144 mmol/L Final         Failed - K in normal range and within 180 days    Potassium  Date Value Ref Range  Status  03/25/2022 4.6 3.5 - 5.2 mmol/L Final         Failed - Cr in normal range and within 180 days    Creat  Date Value Ref Range Status  03/28/2016 0.75 0.60 - 1.35 mg/dL Final   Creatinine, Ser  Date Value Ref Range Status  03/25/2022 0.79 0.76 - 1.27 mg/dL Final         Failed - eGFR is 30 or above and within 180 days    GFR, Est African American  Date Value Ref Range Status  03/28/2016 >89 >=60 mL/min Final   GFR calc Af Amer  Date Value Ref Range Status  04/13/2020 135 >59 mL/min/1.73 Final    Comment:    **[facility] currently reports eGFR in compliance with the current**   recommendations of the [employer]. [facility] will   update reporting as new guidelines are published from the NKF-ASN   Task force.    GFR, Est Non African American  Date Value Ref Range Status  03/28/2016 >89 >=60 mL/min Final   GFR, Estimated  Date Value Ref Range Status  10/04/2020 >60 >60 mL/min Final    Comment:    (NOTE) Calculated using the CKD-EPI Creatinine Equation (2021)    eGFR  Date Value Ref Range Status  03/25/2022 115 >59 mL/min/1.73 Final         Failed - Valid encounter within last 6 months    Recent Outpatient Visits           6 months ago Vitamin D deficiency   [place], Enobong, MD   1 year ago Vitamin D deficiency   [place], En[place]ong, MD   1 year ago Pure hypercholesterolemia   [place], [place], [place]   2 years ago Pure hypercholesterolemia   [place], [place]   3 years ago Vitamin D deficiency   [place], [place]              Passed - Patient is not pregnant      Passed - Last BP in normal range    BP Readings from Last 1 Encounters:  03/15/22 134/76

## 2022-10-03 ENCOUNTER — Other Ambulatory Visit: Payer: Self-pay

## 2022-11-11 ENCOUNTER — Other Ambulatory Visit: Payer: Self-pay

## 2022-11-11 ENCOUNTER — Other Ambulatory Visit: Payer: Self-pay | Admitting: Family Medicine

## 2022-11-11 DIAGNOSIS — I10 Essential (primary) hypertension: Secondary | ICD-10-CM

## 2022-11-11 MED ORDER — LISINOPRIL-HYDROCHLOROTHIAZIDE 10-12.5 MG PO TABS
1.0000 | ORAL_TABLET | Freq: Every day | ORAL | 0 refills | Status: DC
Start: 2022-11-11 — End: 2023-01-10
  Filled 2022-11-11: qty 30, 30d supply, fill #0

## 2022-12-23 ENCOUNTER — Other Ambulatory Visit: Payer: Self-pay

## 2023-01-10 ENCOUNTER — Other Ambulatory Visit: Payer: Self-pay | Admitting: Family Medicine

## 2023-01-10 DIAGNOSIS — I10 Essential (primary) hypertension: Secondary | ICD-10-CM

## 2023-01-10 NOTE — Telephone Encounter (Unsigned)
Copied from CRM [id number]. Topic: General - Other >> Jan 10, 2023  3:33 PM Everette C wrote: Reason for CRM: Medication Refill - Medication: Rx #: [id number]  lisinopril-hydrochlorothiazide (ZESTORETIC) 10-12.5 MG tablet [[id number]]    Has the patient contacted their pharmacy? Yes.   (Agent: If no, request that the patient contact the pharmacy for the refill. If patient does not wish to contact the pharmacy document the reason why and proceed with request.) (Agent: If yes, when and what did the pharmacy advise?)  Preferred Pharmacy (with phone number or street name): [facility] [address], [address] [place] [address] Phone: [phone] Fax: [phone] Hours: M-F 7:30a-6:00p   Has the patient been seen for an appointment in the last year OR does the patient have an upcoming appointment? Yes.    Agent: Please be advised that RX refills may take up to 3 business days. We ask that you follow-up with your pharmacy.

## 2023-01-11 ENCOUNTER — Other Ambulatory Visit: Payer: Self-pay

## 2023-01-11 MED ORDER — LISINOPRIL-HYDROCHLOROTHIAZIDE 10-12.5 MG PO TABS
1.0000 | ORAL_TABLET | Freq: Every day | ORAL | 0 refills | Status: DC
Start: 2023-01-11 — End: 2023-03-02
  Filled 2023-01-11: qty 30, 30d supply, fill #0

## 2023-01-11 NOTE — Telephone Encounter (Signed)
Requested medication (s) are due for refill today: Yes  Requested medication (s) are on the active medication list: Yes  Last refill:  11/11/22 #30 0RF  Future visit scheduled: Yes  Notes to clinic:  Unable to refill per protocol, courtesy refill already given, routing for provider approval.      Requested Prescriptions  Pending Prescriptions Disp Refills   lisinopril-hydrochlorothiazide (ZESTORETIC) 10-12.5 MG tablet 30 tablet 0    Sig: Take 1 tablet by mouth daily. (Must have office visit for refills)     Cardiovascular:  ACEI + Diuretic Combos Failed - 01/10/2023  3:55 PM      Failed - Na in normal range and within 180 days    Sodium  Date Value Ref Range Status  03/25/2022 138 134 - 144 mmol/L Final         Failed - K in normal range and within 180 days    Potassium  Date Value Ref Range Status  03/25/2022 4.6 3.5 - 5.2 mmol/L Final         Failed - Cr in normal range and within 180 days    Creat  Date Value Ref Range Status  03/28/2016 0.75 0.60 - 1.35 mg/dL Final   Creatinine, Ser  Date Value Ref Range Status  03/25/2022 0.79 0.76 - 1.27 mg/dL Final         Failed - eGFR is 30 or above and within 180 days    GFR, Est African American  Date Value Ref Range Status  03/28/2016 >89 >=60 mL/min Final   GFR calc Af Amer  Date Value Ref Range Status  04/13/2020 135 >59 mL/min/1.73 Final    Comment:    **[facility] currently reports eGFR in compliance with the current**   recommendations of the [employer]. [facility] will   update reporting as new guidelines are published from the NKF-ASN   Task force.    GFR, Est Non African American  Date Value Ref Range Status  03/28/2016 >89 >=60 mL/min Final   GFR, Estimated  Date Value Ref Range Status  10/04/2020 >60 >60 mL/min Final    Comment:    (NOTE) Calculated using the CKD-EPI Creatinine Equation (2021)    eGFR  Date Value Ref Range Status  03/25/2022 115 >59 mL/min/1.73 Final         Failed  - Valid encounter within last 6 months    Recent Outpatient Visits           10 months ago Vitamin D deficiency   [facility] Hoy Register, MD   1 year ago Vitamin D deficiency   [place] [facility] Hoy Register, MD   2 years ago Pure hypercholesterolemia   [facility] [place], [place], [place]   2 years ago Pure hypercholesterolemia   [place] [facility] [place], [place], [place]   3 years ago Vitamin D deficiency    [facility] Hoy Register, MD       Future Appointments             In 2 months Hoy Register, MD [facility]            Passed - Patient is not pregnant      Passed - Last BP in normal range    BP Readings from Last 1 Encounters:  03/15/22 134/76

## 2023-01-11 NOTE — Telephone Encounter (Signed)
Using [employer] MW#[id number].   Patient called, advised that he was due for an appointment with Dr. Alvis Lemmings. Patient has been scheduled 03/28/23.

## 2023-01-16 ENCOUNTER — Other Ambulatory Visit: Payer: Self-pay

## 2023-02-17 ENCOUNTER — Other Ambulatory Visit: Payer: Self-pay

## 2023-03-02 ENCOUNTER — Other Ambulatory Visit: Payer: Self-pay | Admitting: Family Medicine

## 2023-03-02 DIAGNOSIS — I10 Essential (primary) hypertension: Secondary | ICD-10-CM

## 2023-03-03 ENCOUNTER — Other Ambulatory Visit: Payer: Self-pay

## 2023-03-03 MED ORDER — LISINOPRIL-HYDROCHLOROTHIAZIDE 10-12.5 MG PO TABS
1.0000 | ORAL_TABLET | Freq: Every day | ORAL | 0 refills | Status: DC
Start: 2023-03-03 — End: 2023-03-28
  Filled 2023-03-03: qty 30, 30d supply, fill #0

## 2023-03-28 ENCOUNTER — Encounter: Payer: Self-pay | Admitting: Family Medicine

## 2023-03-28 ENCOUNTER — Ambulatory Visit: Payer: Self-pay | Attending: Family Medicine | Admitting: Family Medicine

## 2023-03-28 ENCOUNTER — Other Ambulatory Visit: Payer: Self-pay

## 2023-03-28 DIAGNOSIS — I10 Essential (primary) hypertension: Secondary | ICD-10-CM

## 2023-03-28 DIAGNOSIS — E7800 Pure hypercholesterolemia, unspecified: Secondary | ICD-10-CM

## 2023-03-28 MED ORDER — LISINOPRIL-HYDROCHLOROTHIAZIDE 10-12.5 MG PO TABS
1.0000 | ORAL_TABLET | Freq: Every day | ORAL | 0 refills | Status: DC
Start: 2023-03-28 — End: 2023-05-24
  Filled 2023-03-28: qty 30, 30d supply, fill #0

## 2023-03-28 MED ORDER — ATORVASTATIN CALCIUM 40 MG PO TABS
40.0000 mg | ORAL_TABLET | Freq: Every day | ORAL | 3 refills | Status: DC
Start: 2023-03-28 — End: 2023-10-03
  Filled 2023-03-28: qty 30, 30d supply, fill #0
  Filled 2023-05-24: qty 30, 30d supply, fill #1
  Filled 2023-06-30 (×2): qty 30, 30d supply, fill #2
  Filled 2023-08-16 (×2): qty 30, 30d supply, fill #3

## 2023-03-28 NOTE — Progress Notes (Signed)
Subjective:  Patient ID: [name] [name], male    DOB: March 04, 1981  Age: 42 y.o. MRN: [id number]  CC: Medical Management of Chronic Issues   HPI [name] [name] is a 42 y.o. year old male with a history of  hypercholesterolemia, hypertension who presents today for chronic disease management.   Interval History: Discussed the use of AI scribe software for clinical note transcription with the patient, who gave verbal consent to proceed.  He presents for a routine follow-up. He reports adherence to his prescribed regimen of lisinopril/hydrochlorothiazide for blood pressure control and atorvastatin for cholesterol management. The patient's blood pressure is well controlled on the current regimen. However, he has not had recent blood work to assess cholesterol levels. The last blood work was performed a year ago. He denies any current health concerns.        Past Medical History:  Diagnosis Date   Allergy    Chronic kidney disease    Hyperlipidemia    Hypertension     Past Surgical History:  Procedure Laterality Date   CYSTOSCOPY W/ RETROGRADES Left 07/08/2016   Procedure: CYSTOSCOPY WITH RETROGRADE PYELOGRAM AND STENT PLACEMENT;  Surgeon: Sebastian Ache, MD;  Location: [facility];  Service: Urology;  Laterality: Left;   NO PAST SURGERIES     ROBOT ASSISTED PYELOPLASTY Left 07/08/2016   Procedure: XI ROBOTIC ASSISTED PYELOPLASTY;  Surgeon: Sebastian Ache, MD;  Location: [facility];  Service: Urology;  Laterality: Left;    Family History  Problem Relation Age of Onset   Hypertension Father    Leukemia Daughter        age 104 yrs old    Colon cancer Neg Hx    Stomach cancer Neg Hx    Esophageal cancer Neg Hx    Rectal cancer Neg Hx     Social History   Socioeconomic History   Marital status: Legally Separated    Spouse name: Not on file   Number of children: Not on file   Years of education: Not on file   Highest education level: Not on file  Occupational History   Not on file   Tobacco Use   Smoking status: Never   Smokeless tobacco: Never  Vaping Use   Vaping status: Never Used  Substance and Sexual Activity   Alcohol use: Yes    Alcohol/week: 8.0 - 10.0 standard drinks of alcohol    Types: 8 - 10 Cans of beer per week    Comment: occ    Drug use: No   Sexual activity: Yes    Partners: Female  Other Topics Concern   Not on file  Social History Narrative   Not on file   Social Determinants of Health   Financial Resource Strain: Not on file  Food Insecurity: Not on file  Transportation Needs: Not on file  Physical Activity: Not on file  Stress: Not on file  Social Connections: Not on file    Allergies  Allergen Reactions   Doxycycline Rash    Outpatient Medications Prior to Visit  Medication Sig Dispense Refill   atorvastatin (LIPITOR) 40 MG tablet Take 1 tablet (40 mg total) by mouth daily. 30 tablet 3   lisinopril-hydrochlorothiazide (ZESTORETIC) 10-12.5 MG tablet Take 1 tablet by mouth daily. (Must have office visit for refills) 30 tablet 0   ciclopirox (PENLAC) 8 % solution Apply topically at bedtime. Aplicar sobre la ua y la piel circundante. Aplicar diariamente sobre la capa anterior. Despus de siete (7) das, podr Oceanographer  con alcohol y Surveyor, quantity. (Patient not taking: Reported on 03/28/2023) 6.6 mL 0   fluticasone (FLONASE) 50 MCG/ACT nasal spray Place 1 spray into both nostrils daily. 1 g 0   clotrimazole (LOTRIMIN) 1 % cream Apply 1 application topically 2 (two) times daily. (Patient not taking: Reported on 03/28/2023) 30 g 0   Omega-3 Fatty Acids (FISH OIL) 500 MG CAPS Take 1 each by mouth daily. (Patient not taking: Reported on 03/28/2023)     Psyllium (METAMUCIL PO) Take by mouth as needed. (Patient not taking: Reported on 03/28/2023)     terbinafine (LAMISIL) 250 MG tablet Take 1 tablet (250 mg total) by mouth daily. (Patient not taking: Reported on 03/28/2023) 30 tablet 0   Vitamin D, Ergocalciferol, (DRISDOL) 1.25 MG (50000  UNIT) CAPS capsule Take 1 capsule (50,000 Units total) by mouth every 7 (seven) days. (Patient not taking: Reported on 03/28/2023) 12 capsule 1   No facility-administered medications prior to visit.     ROS Review of Systems  Constitutional:  Negative for activity change and appetite change.  HENT:  Negative for sinus pressure and sore throat.   Respiratory:  Negative for chest tightness, shortness of breath and wheezing.   Cardiovascular:  Negative for chest pain and palpitations.  Gastrointestinal:  Negative for abdominal distention, abdominal pain and constipation.  Genitourinary: Negative.   Musculoskeletal: Negative.   Psychiatric/Behavioral:  Negative for behavioral problems and dysphoric mood.     Objective:  BP 138/70   Pulse 79   Ht 5\' 8"  (1.727 m)   Wt 206 lb 12.8 oz (93.8 kg)   SpO2 97%   BMI 31.44 kg/m      03/28/2023    3:47 PM 03/15/2022    4:02 PM 03/04/2022    3:36 PM  BP/Weight  Systolic BP 138 134 130  Diastolic BP 70 76 71  Wt. (Lbs) 206.8 213.6   BMI 31.44 kg/m2 32.48 kg/m2       Physical Exam Constitutional:      Appearance: He is well-developed.  Cardiovascular:     Rate and Rhythm: Normal rate.     Heart sounds: Normal heart sounds. No murmur heard. Pulmonary:     Effort: Pulmonary effort is normal.     Breath sounds: Normal breath sounds. No wheezing or rales.  Chest:     Chest wall: No tenderness.  Abdominal:     General: Bowel sounds are normal. There is no distension.     Palpations: Abdomen is soft. There is no mass.     Tenderness: There is no abdominal tenderness.  Musculoskeletal:        General: Normal range of motion.     Right lower leg: No edema.     Left lower leg: No edema.  Neurological:     Mental Status: He is alert and oriented to person, place, and time.  Psychiatric:        Mood and Affect: Mood normal.        Latest Ref Rng & Units 03/25/2022   11:00 AM 05/07/2021    1:58 PM 12/24/2020    4:27 PM  CMP   Glucose 70 - 99 mg/dL 95  90  92   BUN 6 - 24 mg/dL 15  12  18    Creatinine 0.76 - 1.27 mg/dL 3.08  6.57  8.46   Sodium 134 - 144 mmol/L 138  140  137   Potassium 3.5 - 5.2 mmol/L 4.6  4.3  3.9  Chloride 96 - 106 mmol/L 103  103  99   CO2 20 - 29 mmol/L 20  23  19    Calcium 8.7 - 10.2 mg/dL 9.1  9.5  9.7   Total Protein 6.0 - 8.5 g/dL 7.3  7.5  7.4   Total Bilirubin 0.0 - 1.2 mg/dL 1.0  0.8  0.5   Alkaline Phos 44 - 121 IU/L 81  84  85   AST 0 - 40 IU/L 59  29  37   ALT 0 - 44 IU/L 55  32  42     Lipid Panel     Component Value Date/Time   CHOL 207 (H) 03/25/2022 1100   TRIG 150 (H) 03/25/2022 1100   HDL 85 03/25/2022 1100   CHOLHDL 3.6 03/17/2020 0846   CHOLHDL 4.3 03/28/2016 1229   VLDL 66 (H) 03/28/2016 1229   LDLCALC 97 03/25/2022 1100    CBC    Component Value Date/Time   WBC 7.0 12/24/2020 1627   WBC 5.3 10/04/2020 1029   RBC 5.22 12/24/2020 1627   RBC 5.47 10/04/2020 1029   HGB 15.5 12/24/2020 1627   HCT 44.9 12/24/2020 1627   PLT 209 12/24/2020 1627   MCV 86 12/24/2020 1627   MCH 29.7 12/24/2020 1627   MCH 29.6 10/04/2020 1029   MCHC 34.5 12/24/2020 1627   MCHC 32.5 10/04/2020 1029   RDW 13.3 12/24/2020 1627   LYMPHSABS 1.9 12/24/2020 1627   MONOABS 0.3 10/04/2020 1029   EOSABS 0.3 12/24/2020 1627   BASOSABS 0.0 12/24/2020 1627    Lab Results  Component Value Date   HGBA1C 5.8 (H) 03/25/2022    Assessment & Plan:      Hypertension Well controlled on Lisinopril-Hydrochlorothiazide. -Continue Lisinopril-Hydrochlorothiazide. -Counseled on blood pressure goal of less than 130/80, low-sodium, DASH diet, medication compliance, 150 minutes of moderate intensity exercise per week. Discussed medication compliance, adverse effects.   Hyperlipidemia On Atorvastatin, but no recent lipid panel. -Order fasting lipid panel on upcoming Friday. -Continue Atorvastatin.  General Health Maintenance / Followup Plans -Encourage regular exercise, such as  walking. -Schedule follow-up appointment in six months.          Meds ordered this encounter  Medications   atorvastatin (LIPITOR) 40 MG tablet    Sig: Take 1 tablet (40 mg total) by mouth daily.    Dispense:  30 tablet    Refill:  3   lisinopril-hydrochlorothiazide (ZESTORETIC) 10-12.5 MG tablet    Sig: Take 1 tablet by mouth daily. (Must have office visit for refills)    Dispense:  30 tablet    Refill:  0    Must have office visit for refills    Follow-up: Return in about 6 months (around 09/25/2023) for Chronic medical conditions.       Hoy Register, MD, FAAFP. [facility] [place], [place] [phone]   03/28/2023, 4:50 PM

## 2023-03-28 NOTE — Patient Instructions (Addendum)
Dislipidemia Dyslipidemia La dislipidemia es un desequilibrio de sustancias cerosas parecidas a la grasa (lpidos) en la sangre. El cuerpo necesita lpidos en pequeas cantidades. Con frecuencia, la dislipidemia implica un nivel alto de colesterol o triglicridos, que son tipos de lpidos. Las formas frecuentes de dislipidemia incluyen las siguientes: Niveles elevados de colesterol LDL. El LDL es el tipo de colesterol que causa la acumulacin de depsitos de grasa (placas) en los vasos sanguneos que transportan la sangre fuera del corazn (arterias). Niveles bajos de colesterol HDL. El HDL es el tipo de colesterol que brinda proteccin contra las enfermedades cardacas. Los niveles altos de HDL eliminan la acumulacin de LDL de las arterias. Niveles altos de triglicridos. Los triglicridos son [name] [name] sustancia grasa presente en la sangre que se relaciona con la acumulacin de placa en las arterias. Cules son las causas? Hay dos tipos principales de dislipidemia: primaria y [name]. La dislipidemia primaria es causada por cambios (mutaciones) en los genes que se transmiten a travs de las familias (se heredan). Estas mutaciones causan varios tipos de dislipidemia. La dislipidemia secundaria puede ser causada por diversos factores de riesgo que pueden provocar la enfermedad, como las opciones de estilo de vida y Theatre manager. Qu incrementa el riesgo? Tiene ms probabilidades de Aeronautical engineer afeccin si es un hombre mayor o si es una mujer que ha pasado por la menopausia. Otros factores de riesgo son los siguientes: Tener antecedentes familiares de dislipidemia. Tomar determinados medicamentos, entre ellos, pldoras anticonceptivas, corticoesteroides, algunos diurticos y betabloqueantes. Seguir una dieta con alto contenido de grasas saturadas. Fumar cigarrillos o beber alcohol en exceso. Tener ciertas afecciones mdicas, como diabetes, sndrome de ovario poliqustico (SOP), enfermedad  renal, enfermedad heptica o hipotiroidismo. No hacer ejercicio regularmente. Tener sobrepeso o ser obeso con demasiada grasa en el abdomen. Cules son los signos o sntomas? En la [employer], la dislipidemia no causa ningn sntoma. En los [place] [name] [name] graves, los niveles muy altos de lpidos pueden causar: Protuberancias de grasa debajo de la piel (xantomas). Un anillo blanco o gris alrededor del centro negro (pupila) del ojo. Los niveles muy altos de triglicridos pueden causar inflamacin del pncreas (pancreatitis). Cmo se diagnostica? Su mdico puede diagnosticar dislipidemia basndose en un anlisis de sangre de rutina (anlisis de sangre en [name]). Como la [name] de las personas no tienen sntomas de la afeccin, este anlisis de sangre (perfil de lpidos) se realiza en adultos mayores de 20 aos y se repite cada 4 a 6 aos. En este anlisis, se controla lo siguiente: Colesterol total. Esto mide la cantidad total de colesterol en la sangre, que incluye el colesterol LDL, el colesterol HDL y los triglicridos. Un valor saludable est por debajo de 200 mg/dl (2.95 mmol/l). Colesterol LDL. El valor objetivo de colesterol LDL es diferente para cada persona, en funcin de los factores de riesgo individuales. Un valor saludable suele estar por debajo de 100 mg/dl (2.84 mmol/l). Consulte al mdico cul debe ser el valor del colesterol LDL para usted. Colesterol HDL. Un nivel de colesterol HDL de 60 mg/dl (1.32 mmol/l) o superior es lo mejor porque ayuda a Health visitor las enfermedades cardacas. Un valor por debajo de 40 mg/dl (4.40 mmol/l) para los hombres o por debajo de 50 mg/dl (1.02 mmol/l) para las mujeres aumenta el riesgo de enfermedad cardaca. Triglicridos. Un valor de triglicridos saludable est por debajo de 150 mg/dl (7.25 mmol/l). Si su perfil de lpidos es anormal, su mdico puede realizar otros anlisis de [name]. Cmo se trata? [name]  depende del tipo de  dislipidemia que usted tenga y sus otros factores de riesgo de enfermedades cardacas o accidente cerebrovascular. Su mdico tendr un rango objetivo para sus niveles de lpidos en funcin de esta informacin. El tratamiento para la dislipidemia comienza con cambios en el estilo de vida, tales como dieta y ejercicio. El mdico podra recomendarle que haga lo siguiente: Hacer ejercicio con regularidad. Realizar cambios en la dieta. Si fuma, dejar de hacerlo. Limitar el consumo de bebidas alcohlicas. Si los cambios en la dieta y la actividad fsica no ayudan a [name] sus objetivos, el mdico tambin puede recetarle medicamentos para disminuir los lpidos. El tipo de medicamento recetado con ms frecuencia disminuye el colesterol LDL (estatinas). Si tiene [name] de triglicridos, su mdico puede recetarle otro tipo de frmaco (fibratos) o un suplemento de aceite de pescado con omega-3, o ambos. Siga estas instrucciones en su casa: Comida y bebida  Siga las indicaciones del mdico o el nutricionista respecto de las restricciones para las comidas o las bebidas. Siga una dieta saludable como se lo haya indicado el mdico. Esto puede ayudarle a [name] un peso saludable, reducir el colesterol LDL y aumentar el colesterol HDL. Puede incluir: Limitar sus caloras, si tiene sobrepeso. Comer ms frutas, verduras, cereales integrales, pescado y carnes magras. Limitar las grasas saturadas, las grasas trans y Print production planner. No beba alcohol si: Su mdico le indica no hacerlo. Est embarazada, puede estar embarazada o est tratando de [name]. Si bebe alcohol: Limite la cantidad que bebe a lo siguiente: De 0 a 1 medida por da para las mujeres. De 0 a 2 medidas por da para los hombres. Sepa cunta cantidad de alcohol hay en las bebidas que toma. En los [name], una medida equivale a una botella de cerveza de 12 oz (355 ml), un vaso de vino de 5 oz (148 ml) o un vaso de  una bebida alcohlica de alta graduacin de 1 oz (44 ml). Actividad Haga ejercicio con regularidad. Siga un programa de ejercicio y entrenamiento de fuerza tal como se lo haya indicado el mdico. Pregntele al mdico qu actividades son seguras para usted. El mdico puede recomendarle lo siguiente: 30 minutos de [place] de 4 a 6 das por [address] La caminata a paso ligero es un ejemplo de [place]. Entrenamiento de fuerza 2 [employer]. Indicaciones generales No consuma ningn producto que contenga nicotina o tabaco. Estos productos incluyen cigarrillos, tabaco para Theatre manager y aparatos de vapeo, como los Administrator, Civil Service. Si necesita ayuda para dejar de consumir estos productos, consulte al [employer]. Use los medicamentos de venta libre y los recetados solamente como se lo haya indicado el mdico. Esto incluye los suplementos. Concurra a todas las visitas de seguimiento. Esto es importante. Comunquese con un mdico si: Tiene dificultad para cumplir con su plan de actividad fsica o su dieta. Le cuesta dejar de fumar o controlar el consumo de alcohol. Resumen Con frecuencia, la dislipidemia implica un nivel alto de colesterol o triglicridos, que son tipos de lpidos. El tratamiento depende del tipo de dislipidemia que usted tenga y sus otros factores de riesgo de enfermedades cardacas o accidente cerebrovascular. El tratamiento para la dislipidemia comienza con cambios en el estilo de vida, tales como dieta y ejercicio. Su mdico puede recetarle medicamentos para disminuir los lpidos. Esta informacin no tiene Theme park manager el consejo del mdico. Asegrese de hacerle al mdico cualquier pregunta que tenga. Document Revised: 09/04/2020 Document Reviewed: 09/04/2020 Elsevier Patient  Education  2024 [employer].

## 2023-03-31 ENCOUNTER — Other Ambulatory Visit: Payer: Self-pay

## 2023-03-31 ENCOUNTER — Ambulatory Visit: Payer: Self-pay | Attending: Family Medicine

## 2023-03-31 DIAGNOSIS — E7800 Pure hypercholesterolemia, unspecified: Secondary | ICD-10-CM

## 2023-03-31 DIAGNOSIS — I10 Essential (primary) hypertension: Secondary | ICD-10-CM

## 2023-04-01 LAB — CMP14+EGFR
ALT: 40 [IU]/L (ref 0–44)
AST: 30 [IU]/L (ref 0–40)
Albumin: 4.6 g/dL (ref 4.1–5.1)
Alkaline Phosphatase: 80 [IU]/L (ref 44–121)
BUN/Creatinine Ratio: 19 (ref 9–20)
BUN: 15 mg/dL (ref 6–24)
Bilirubin Total: 1 mg/dL (ref 0.0–1.2)
CO2: 23 mmol/L (ref 20–29)
Calcium: 9.9 mg/dL (ref 8.7–10.2)
Chloride: 103 mmol/L (ref 96–106)
Creatinine, Ser: 0.81 mg/dL (ref 0.76–1.27)
Globulin, Total: 3 g/dL (ref 1.5–4.5)
Glucose: 92 mg/dL (ref 70–99)
Potassium: 5 mmol/L (ref 3.5–5.2)
Sodium: 141 mmol/L (ref 134–144)
Total Protein: 7.6 g/dL (ref 6.0–8.5)
eGFR: 114 mL/min/{1.73_m2} (ref >59)

## 2023-04-01 LAB — CBC WITH DIFFERENTIAL/PLATELET
Basophils Absolute: 0.1 10*3/uL (ref 0.0–0.2)
Basos: 1 %
EOS (ABSOLUTE): 0.3 10*3/uL (ref 0.0–0.4)
Eos: 5 %
Hematocrit: 50.1 % (ref 37.5–51.0)
Hemoglobin: 16.5 g/dL (ref 13.0–17.7)
Immature Grans (Abs): 0 10*3/uL (ref 0.0–0.1)
Immature Granulocytes: 0 %
Lymphocytes Absolute: 1.8 10*3/uL (ref 0.7–3.1)
Lymphs: 25 %
MCH: 30.9 pg (ref 26.6–33.0)
MCHC: 32.9 g/dL (ref 31.5–35.7)
MCV: 94 fL (ref 79–97)
Monocytes Absolute: 0.5 10*3/uL (ref 0.1–0.9)
Monocytes: 7 %
Neutrophils Absolute: 4.6 10*3/uL (ref 1.4–7.0)
Neutrophils: 62 %
Platelets: 244 10*3/uL (ref 150–450)
RBC: 5.34 x10E6/uL (ref 4.14–5.80)
RDW: 12.4 % (ref 11.6–15.4)
WBC: 7.4 10*3/uL (ref 3.4–10.8)

## 2023-04-01 LAB — LP+NON-HDL CHOLESTEROL
Cholesterol, Total: 195 mg/dL (ref 100–199)
HDL: 70 mg/dL (ref >39)
LDL Chol Calc (NIH): 112 mg/dL — ABNORMAL HIGH (ref 0–99)
Total Non-HDL-Chol (LDL+VLDL): 125 mg/dL (ref 0–129)
Triglycerides: 74 mg/dL (ref 0–149)
VLDL Cholesterol Cal: 13 mg/dL (ref 5–40)

## 2023-05-24 ENCOUNTER — Other Ambulatory Visit: Payer: Self-pay

## 2023-05-24 ENCOUNTER — Other Ambulatory Visit: Payer: Self-pay | Admitting: Family Medicine

## 2023-05-24 DIAGNOSIS — I10 Essential (primary) hypertension: Secondary | ICD-10-CM

## 2023-05-24 MED ORDER — LISINOPRIL-HYDROCHLOROTHIAZIDE 10-12.5 MG PO TABS
1.0000 | ORAL_TABLET | Freq: Every day | ORAL | 1 refills | Status: DC
  Filled 2023-05-24: qty 90, 90d supply, fill #0
  Filled 2023-10-03: qty 90, 90d supply, fill #1

## 2023-05-25 ENCOUNTER — Other Ambulatory Visit: Payer: Self-pay

## 2023-05-26 ENCOUNTER — Other Ambulatory Visit: Payer: Self-pay

## 2023-06-30 ENCOUNTER — Other Ambulatory Visit: Payer: Self-pay

## 2023-07-03 ENCOUNTER — Other Ambulatory Visit: Payer: Self-pay

## 2023-08-16 ENCOUNTER — Other Ambulatory Visit: Payer: Self-pay

## 2023-08-18 ENCOUNTER — Other Ambulatory Visit: Payer: Self-pay

## 2023-09-26 ENCOUNTER — Ambulatory Visit: Payer: Self-pay | Attending: Family Medicine | Admitting: Family Medicine

## 2023-10-03 ENCOUNTER — Other Ambulatory Visit: Payer: Self-pay | Admitting: Family Medicine

## 2023-10-03 ENCOUNTER — Other Ambulatory Visit: Payer: Self-pay

## 2023-10-03 DIAGNOSIS — E7800 Pure hypercholesterolemia, unspecified: Secondary | ICD-10-CM

## 2023-10-03 MED ORDER — ATORVASTATIN CALCIUM 40 MG PO TABS
40.0000 mg | ORAL_TABLET | Freq: Every day | ORAL | 0 refills | Status: DC
  Filled 2023-10-03: qty 30, 30d supply, fill #0

## 2023-10-04 ENCOUNTER — Other Ambulatory Visit: Payer: Self-pay

## 2023-11-14 ENCOUNTER — Other Ambulatory Visit: Payer: Self-pay | Admitting: Family Medicine

## 2023-11-14 DIAGNOSIS — E7800 Pure hypercholesterolemia, unspecified: Secondary | ICD-10-CM

## 2023-11-14 NOTE — Telephone Encounter (Unsigned)
 Copied from CRM [id number]. Topic: Clinical - Medication Refill >> Nov 14, 2023  4:19 PM Sophia H wrote: Medication: atorvastatin  (LIPITOR) 40 MG tablet   Has the patient contacted their pharmacy? Yes (Agent: If no, request that the patient contact the pharmacy for the refill. If patient does not wish to contact the pharmacy document the reason why and proceed with request.) (Agent: If yes, when and what did the pharmacy advise?)  This is the patient's preferred pharmacy:  [facility] [address] [place] [address] Phone: [phone] Fax: [phone]  Is this the correct pharmacy for this prescription? Yes If no, delete pharmacy and type the correct one.   Has the prescription been filled recently? Yes  Is the patient out of the medication? Yes  Has the patient been seen for an appointment in the last year OR does the patient have an upcoming appointment? Yes  Can we respond through MyChart? Yes  Agent: Please be advised that Rx refills may take up to 3 business days. We ask that you follow-up with your pharmacy.

## 2023-11-16 ENCOUNTER — Other Ambulatory Visit: Payer: Self-pay

## 2023-11-16 MED ORDER — ATORVASTATIN CALCIUM 40 MG PO TABS
40.0000 mg | ORAL_TABLET | Freq: Every day | ORAL | 0 refills | Status: DC
  Filled 2023-11-16: qty 30, 30d supply, fill #0

## 2023-11-16 NOTE — Telephone Encounter (Signed)
 Requested Prescriptions  Pending Prescriptions Disp Refills   atorvastatin  (LIPITOR) 40 MG tablet 30 tablet 0    Sig: Take 1 tablet (40 mg total) by mouth daily.     Cardiovascular:  Antilipid - Statins Failed - 11/16/2023 11:30 AM      Failed - Lipid Panel in normal range within the last 12 months    Cholesterol, Total  Date Value Ref Range Status  03/31/2023 195 100 - 199 mg/dL Final   LDL Chol Calc (NIH)  Date Value Ref Range Status  03/31/2023 112 (H) 0 - 99 mg/dL Final   HDL  Date Value Ref Range Status  03/31/2023 70 >39 mg/dL Final   Triglycerides  Date Value Ref Range Status  03/31/2023 74 0 - 149 mg/dL Final         Passed - Patient is not pregnant      Passed - Valid encounter within last 12 months    Recent Outpatient Visits           7 months ago Pure hypercholesterolemia   [facility] [place]. [facility] Joaquin Mulberry, MD   1 year ago Vitamin D  deficiency   [facility] [place]. [facility] Joaquin Mulberry, MD   2 years ago Vitamin D  deficiency   [facility] [place]. [facility] Joaquin Mulberry, MD   2 years ago Pure hypercholesterolemia   [facility] [place]. [facility] [place], Stan Eans, [place]   3 years ago Pure hypercholesterolemia   [facility] Of [place]. [facility] [place], [place], PA-C

## 2023-11-27 NOTE — Progress Notes (Unsigned)
   Established Patient Office Visit  Subjective   Patient ID: [name] [name], male    DOB: 1980-12-13  Age: 43 y.o. MRN: [id number]  No chief complaint on file.   43 y.o. M f/u on chronic conditions  HTN HLD Vit D def  [facility] pcp  Last seen:  03/2023 Hypertension Well controlled on Lisinopril -Hydrochlorothiazide . -Continue Lisinopril -Hydrochlorothiazide . -Counseled on blood pressure goal of less than 130/80, low-sodium, DASH diet, medication compliance, 150 minutes of moderate intensity exercise per week. Discussed medication compliance, adverse effects.     Hyperlipidemia On Atorvastatin , but no recent lipid panel. -Order fasting lipid panel on upcoming Friday. -Continue Atorvastatin .   General Health Maintenance / Followup Plans -Encourage regular exercise, such as walking. -Schedule follow-up appointment in six months.           {History (Optional):23778}  ROS    Objective:     There were no vitals taken for this visit. {Vitals History (Optional):23777}  Physical Exam   No results found for any visits on 11/30/23.  {Labs (Optional):23779}  The 10-year ASCVD risk score (Arnett DK, et al., 2019) is: 1%    Assessment & Plan:   Problem List Items Addressed This Visit   None   No follow-ups on file.    Arlene Lacy, MD

## 2023-11-30 ENCOUNTER — Ambulatory Visit: Payer: Self-pay | Attending: Critical Care Medicine | Admitting: Critical Care Medicine

## 2023-11-30 ENCOUNTER — Other Ambulatory Visit: Payer: Self-pay

## 2023-11-30 ENCOUNTER — Encounter: Payer: Self-pay | Admitting: Critical Care Medicine

## 2023-11-30 VITALS — BP 109/64 | HR 77 | Ht 68.0 in | Wt 214.0 lb

## 2023-11-30 DIAGNOSIS — Z789 Other specified health status: Secondary | ICD-10-CM

## 2023-11-30 DIAGNOSIS — E7800 Pure hypercholesterolemia, unspecified: Secondary | ICD-10-CM

## 2023-11-30 DIAGNOSIS — I10 Essential (primary) hypertension: Secondary | ICD-10-CM

## 2023-11-30 MED ORDER — ATORVASTATIN CALCIUM 40 MG PO TABS
40.0000 mg | ORAL_TABLET | Freq: Every day | ORAL | 2 refills | Status: AC
Start: 2023-11-30 — End: ?
  Filled 2023-11-30: qty 90, 90d supply, fill #0
  Filled 2023-12-29: qty 30, 30d supply, fill #0
  Filled 2024-02-16 (×2): qty 30, 30d supply, fill #1
  Filled 2024-03-29: qty 30, 30d supply, fill #2
  Filled 2024-05-17: qty 30, 30d supply, fill #3
  Filled 2024-06-17 (×2): qty 30, 30d supply, fill #4

## 2023-11-30 MED ORDER — LISINOPRIL-HYDROCHLOROTHIAZIDE 10-12.5 MG PO TABS
1.0000 | ORAL_TABLET | Freq: Every day | ORAL | 3 refills | Status: AC
  Filled 2023-11-30 – 2024-02-08 (×2): qty 90, 90d supply, fill #0
  Filled 2024-06-20: qty 90, 90d supply, fill #1

## 2023-11-30 NOTE — Assessment & Plan Note (Addendum)
 Hypertension well-controlled continue lisinopril  HCTZ refills given

## 2023-11-30 NOTE — Patient Instructions (Addendum)
 [facility] at [address] Jonette Nestle  All medications were refilled sent to her pharmacy  Labs today will be liver function and cholesterol level will call you results  Return to see Dr. Newlin in 6 months  [facility] en [address] [place] resurtieron todos los medicamentos y se enviaron a su farmacia.  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 Le enviar los resultados por correo.  Regrese a la consulta con la Dra. Newlin en 6 meses.

## 2023-11-30 NOTE — Assessment & Plan Note (Signed)
 Hyperlipidemia will check liver function and lipid panel and refill atorvastatin 

## 2023-12-01 ENCOUNTER — Ambulatory Visit: Payer: Self-pay | Admitting: Critical Care Medicine

## 2023-12-01 LAB — LIPID PANEL
Chol/HDL Ratio: 3.4 ratio (ref 0.0–5.0)
Cholesterol, Total: 236 mg/dL — ABNORMAL HIGH (ref 100–199)
HDL: 69 mg/dL (ref >39)
LDL Chol Calc (NIH): 113 mg/dL — ABNORMAL HIGH (ref 0–99)
Triglycerides: 319 mg/dL — ABNORMAL HIGH (ref 0–149)
VLDL Cholesterol Cal: 54 mg/dL — ABNORMAL HIGH (ref 5–40)

## 2023-12-01 LAB — HEPATIC FUNCTION PANEL
ALT: 49 IU/L — ABNORMAL HIGH (ref 0–44)
AST: 45 IU/L — ABNORMAL HIGH (ref 0–40)
Albumin: 5 g/dL (ref 4.1–5.1)
Alkaline Phosphatase: 105 IU/L (ref 44–121)
Bilirubin Total: 0.7 mg/dL (ref 0.0–1.2)
Bilirubin, Direct: 0.24 mg/dL (ref 0.00–0.40)
Total Protein: 7.9 g/dL (ref 6.0–8.5)

## 2023-12-01 NOTE — Progress Notes (Signed)
 Let patient know cholesterol remains very high please make sure he is taking the cholesterol pill daily and follow a healthy plant-based diet is much as possible

## 2023-12-29 ENCOUNTER — Other Ambulatory Visit: Payer: Self-pay

## 2024-01-01 ENCOUNTER — Other Ambulatory Visit: Payer: Self-pay

## 2024-02-09 ENCOUNTER — Other Ambulatory Visit: Payer: Self-pay

## 2024-02-16 ENCOUNTER — Other Ambulatory Visit: Payer: Self-pay

## 2024-02-19 ENCOUNTER — Other Ambulatory Visit (HOSPITAL_COMMUNITY): Payer: Self-pay

## 2024-02-21 ENCOUNTER — Other Ambulatory Visit: Payer: Self-pay

## 2024-02-22 ENCOUNTER — Other Ambulatory Visit: Payer: Self-pay

## 2024-02-29 ENCOUNTER — Other Ambulatory Visit: Payer: Self-pay | Admitting: Family Medicine

## 2024-02-29 ENCOUNTER — Ambulatory Visit
Admission: RE | Admit: 2024-02-29 | Discharge: 2024-02-29 | Disposition: A | Discharge status: Home / Self Care | Payer: Worker's Compensation | Source: Ambulatory Visit | Attending: Family Medicine | Admitting: Family Medicine

## 2024-02-29 DIAGNOSIS — T1490XA Injury, unspecified, initial encounter: Secondary | ICD-10-CM

## 2024-03-29 ENCOUNTER — Other Ambulatory Visit: Payer: Self-pay

## 2024-04-02 ENCOUNTER — Other Ambulatory Visit: Payer: Self-pay

## 2024-05-17 ENCOUNTER — Other Ambulatory Visit: Payer: Self-pay

## 2024-05-22 ENCOUNTER — Other Ambulatory Visit: Payer: Self-pay

## 2024-06-05 ENCOUNTER — Ambulatory Visit: Payer: Self-pay | Admitting: Family Medicine

## 2024-06-17 ENCOUNTER — Other Ambulatory Visit: Payer: Self-pay

## 2024-06-21 ENCOUNTER — Other Ambulatory Visit: Payer: Self-pay
